# Patient Record
Sex: Female | Born: 1940 | Race: White | Hispanic: No | Marital: Married | Smoking: Never smoker
Health system: Southern US, Community
[De-identification: ages and names within clinical notes are randomized; demographics above are authoritative.]

## PROBLEM LIST (undated history)

## (undated) DIAGNOSIS — M1990 Unspecified osteoarthritis, unspecified site: Secondary | ICD-10-CM

## (undated) DIAGNOSIS — I8390 Asymptomatic varicose veins of unspecified lower extremity: Secondary | ICD-10-CM

## (undated) DIAGNOSIS — Z9049 Acquired absence of other specified parts of digestive tract: Secondary | ICD-10-CM

## (undated) DIAGNOSIS — I10 Essential (primary) hypertension: Secondary | ICD-10-CM

## (undated) DIAGNOSIS — L03116 Cellulitis of left lower limb: Secondary | ICD-10-CM

## (undated) DIAGNOSIS — K436 Other and unspecified ventral hernia with obstruction, without gangrene: Secondary | ICD-10-CM

## (undated) DIAGNOSIS — M109 Gout, unspecified: Secondary | ICD-10-CM

## (undated) DIAGNOSIS — E785 Hyperlipidemia, unspecified: Secondary | ICD-10-CM

## (undated) HISTORY — PX: VAGINAL HYSTERECTOMY: SUR661

## (undated) HISTORY — DX: Hyperlipidemia, unspecified: E78.5

## (undated) HISTORY — DX: Gout, unspecified: M10.9

## (undated) HISTORY — DX: Cellulitis of left lower limb: L03.116

## (undated) HISTORY — PX: APPENDECTOMY: SHX54

## (undated) HISTORY — DX: Other and unspecified ventral hernia with obstruction, without gangrene: K43.6

## (undated) HISTORY — DX: Asymptomatic varicose veins of unspecified lower extremity: I83.90

## (undated) HISTORY — PX: COLONOSCOPY: SHX174

## (undated) HISTORY — PX: ECTOPIC PREGNANCY SURGERY: SHX613

## (undated) HISTORY — DX: Essential (primary) hypertension: I10

## (undated) HISTORY — PX: TONSILLECTOMY AND ADENOIDECTOMY: SHX28

---

## 2000-05-24 ENCOUNTER — Emergency Department (HOSPITAL_COMMUNITY): Admission: EM | Admit: 2000-05-24 | Discharge: 2000-05-24 | Payer: Self-pay | Admitting: Emergency Medicine

## 2001-09-29 ENCOUNTER — Encounter: Payer: Self-pay | Admitting: Endocrinology

## 2001-09-29 ENCOUNTER — Encounter: Admission: RE | Admit: 2001-09-29 | Discharge: 2001-09-29 | Payer: Self-pay | Admitting: Endocrinology

## 2001-11-09 ENCOUNTER — Encounter: Payer: Self-pay | Admitting: Endocrinology

## 2001-11-09 ENCOUNTER — Ambulatory Visit (HOSPITAL_COMMUNITY): Admission: RE | Admit: 2001-11-09 | Discharge: 2001-11-09 | Payer: Self-pay | Admitting: Endocrinology

## 2001-11-11 ENCOUNTER — Encounter: Payer: Self-pay | Admitting: Endocrinology

## 2001-11-11 ENCOUNTER — Ambulatory Visit (HOSPITAL_COMMUNITY): Admission: RE | Admit: 2001-11-11 | Discharge: 2001-11-11 | Payer: Self-pay | Admitting: Endocrinology

## 2001-12-01 ENCOUNTER — Encounter: Admission: RE | Admit: 2001-12-01 | Discharge: 2001-12-01 | Payer: Self-pay | Admitting: *Deleted

## 2001-12-01 ENCOUNTER — Encounter: Payer: Self-pay | Admitting: *Deleted

## 2001-12-07 ENCOUNTER — Ambulatory Visit (HOSPITAL_COMMUNITY): Admission: RE | Admit: 2001-12-07 | Discharge: 2001-12-07 | Payer: Self-pay | Admitting: *Deleted

## 2001-12-07 ENCOUNTER — Encounter (INDEPENDENT_AMBULATORY_CARE_PROVIDER_SITE_OTHER): Payer: Self-pay | Admitting: *Deleted

## 2002-01-05 ENCOUNTER — Inpatient Hospital Stay (HOSPITAL_COMMUNITY): Admission: EM | Admit: 2002-01-05 | Discharge: 2002-01-12 | Payer: Self-pay | Admitting: Endocrinology

## 2002-12-12 ENCOUNTER — Encounter: Admission: RE | Admit: 2002-12-12 | Discharge: 2002-12-12 | Payer: Self-pay | Admitting: Endocrinology

## 2002-12-12 ENCOUNTER — Encounter: Payer: Self-pay | Admitting: Endocrinology

## 2004-09-27 ENCOUNTER — Encounter: Admission: RE | Admit: 2004-09-27 | Discharge: 2004-09-27 | Payer: Self-pay | Admitting: Endocrinology

## 2007-06-23 ENCOUNTER — Encounter: Admission: RE | Admit: 2007-06-23 | Discharge: 2007-06-23 | Payer: Self-pay | Admitting: General Surgery

## 2007-06-23 ENCOUNTER — Encounter: Payer: Self-pay | Admitting: Internal Medicine

## 2007-09-08 ENCOUNTER — Emergency Department (HOSPITAL_COMMUNITY): Admission: EM | Admit: 2007-09-08 | Discharge: 2007-09-08 | Payer: Self-pay | Admitting: Emergency Medicine

## 2008-07-11 ENCOUNTER — Encounter: Admission: RE | Admit: 2008-07-11 | Discharge: 2008-07-11 | Payer: Self-pay | Admitting: Family Medicine

## 2009-05-11 DIAGNOSIS — E669 Obesity, unspecified: Secondary | ICD-10-CM | POA: Insufficient documentation

## 2009-12-25 ENCOUNTER — Encounter: Admission: RE | Admit: 2009-12-25 | Discharge: 2009-12-25 | Payer: Self-pay | Admitting: Family Medicine

## 2010-03-11 ENCOUNTER — Encounter: Payer: Self-pay | Admitting: Internal Medicine

## 2010-03-12 ENCOUNTER — Encounter: Payer: Self-pay | Admitting: Internal Medicine

## 2010-03-22 ENCOUNTER — Other Ambulatory Visit: Payer: Self-pay | Admitting: Internal Medicine

## 2010-03-22 ENCOUNTER — Encounter (INDEPENDENT_AMBULATORY_CARE_PROVIDER_SITE_OTHER): Payer: Self-pay | Admitting: *Deleted

## 2010-03-22 ENCOUNTER — Ambulatory Visit
Admission: RE | Admit: 2010-03-22 | Discharge: 2010-03-22 | Payer: Self-pay | Source: Home / Self Care | Attending: Internal Medicine | Admitting: Internal Medicine

## 2010-03-22 DIAGNOSIS — R109 Unspecified abdominal pain: Secondary | ICD-10-CM | POA: Insufficient documentation

## 2010-03-22 DIAGNOSIS — R112 Nausea with vomiting, unspecified: Secondary | ICD-10-CM | POA: Insufficient documentation

## 2010-03-22 LAB — HEPATIC FUNCTION PANEL
Alkaline Phosphatase: 82 U/L (ref 39–117)
Bilirubin, Direct: 0.2 mg/dL (ref 0.0–0.3)
Total Protein: 6.6 g/dL (ref 6.0–8.3)

## 2010-03-28 NOTE — Letter (Signed)
Summary: Diabetic Instructions  [facility]  [address] [place], [place] [address]   Phone: [phone]  Fax: [phone]    [name] 1940-06-25 MRN: [id number]   X   ORAL DIABETIC MEDICATION INSTRUCTIONS  The day before your procedure:   Take your diabetic pill as you do normally  The day of your procedure:   Do not take your diabetic pill    We will check your blood sugar levels during the admission process and again in Recovery before discharging you home  ________________________________________________________________________  X   INSULIN (LONG ACTING) MEDICATION INSTRUCTIONS (Lantus, NPH, 70/30, Humulin, Novolin-N)   The day before your procedure:   Take  your regular evening dose    The day of your procedure:   Do not take your morning dose

## 2010-03-28 NOTE — Assessment & Plan Note (Addendum)
Summary: EPIGASTRIC PAIN/YF   History of Present Illness Visit Type: Initial Consult Primary GI MD: Stan Head MD [facility] Primary Provider: Valli Glance Requesting Provider: Valli Glance Chief Complaint: Epigastric Pain after eating meals History of Present Illness:   70 yo ww c/o terrible epigastric pain after eating and someties is periumbilical. Cannot identify specific food triggers. No new medications and no sick contacts. she is nauseated throught the dat regardless of what she eats. Raw vegetables or fruit are vomited. Milk products lead to nausea and vomiting. She sometmes vomits crackers.  Problems have been present for about 1 month. at that time she had 2 days or so of nausea and vomiting without other sxs. she thought that she had a viral syndrome, did not have fevers or other sxs. Similar problems in 2003 when had H. pylori (treated) and also had a negative gallbladder evaluation.  She had resolution for about a week and then the symptoms as above began. some loose stools when she has periumbilical pain. She has restricted by mouth intake, lost weight, and blood sugars have been lower than usual (69-70's).     GI Review of Systems    Reports abdominal pain, belching, nausea, and  vomiting.     Location of  Abdominal pain: epigastric area.    Denies acid reflux, bloating, chest pain, dysphagia with liquids, dysphagia with solids, heartburn, loss of appetite, vomiting blood, weight loss, and  weight gain.        Denies anal fissure, black tarry stools, change in bowel habit, constipation, diarrhea, diverticulosis, fecal incontinence, heme positive stool, hemorrhoids, irritable bowel syndrome, jaundice, light color stool, liver problems, rectal bleeding, and  rectal pain. Preventive Screening-Counseling & Management  Alcohol-Tobacco     Smoking Status: never      Drug Use:  no.      Current Medications (verified): 1)  Aspir-Low 81 Mg Tbec (Aspirin) .Marland Kitchen.. 1 By Mouth Once  Daily 2)  Diovan 160 Mg Tabs (Valsartan) .Marland Kitchen.. 1 By Mouth Once Daily 3)  Januvia 100 Mg Tabs (Sitagliptin Phosphate) .Marland Kitchen.. 1 By Mouth Once Daily 4)  Lantus 100 Unit/ml Soln (Insulin Glargine) .... 20u in The Am and 40u Q Pm 5)  Novolog 100 Unit/ml Soln (Insulin Aspart) .... Use As Directed 6)  Nystatin-Triamcinolone 100000-0.1 Unit/gm-% Crea (Nystatin-Triamcinolone) .... Apply Two Times A Day 7)  Simvastatin 10 Mg Tabs (Simvastatin) .Marland Kitchen.. 1 By Mouth Once Daily 8)  Torsemide 20 Mg Tabs (Torsemide) .... 2 By Mouth Once Daily  Allergies (verified): 1)  ! Codeine 2)  ! 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  Alcohol Use - no Illicit Drug Use - no Smoking Status:  never Drug Use:  no  Review of Systems       All other ROS negative except as per HPI.   Vital Signs:  Patient profile:   70 year old female Height:      63 inches Weight:      319 pounds BMI:     56.71 BSA:     2.36 Pulse rate:   80 / minute Pulse rhythm:   regular BP sitting:   118 / 76  (left arm)  Vitals Entered By: Merri Ray CMA Duncan Dull) (March 22, 2010 2:46 PM)  Physical Exam  General:  obese.  NAD Eyes:  PERRLA, no icterus. Mouth:  No deformity or lesions,  Neck:  Supple; no masses or thyromegaly. Lungs:  Clear throughout to auscultation. Heart:  Regular rate and rhythm; no murmurs, rubs,  or bruits. Abdomen:  obese, soft and nontender without splash reducible umbilical hernia BS+ Extremities:  1 + edema bilateral with hyperpigmentation and erythema (slight) but no warmth Neurologic:  Alert and  oriented  x3 Skin:  hyperpigmented intertriginous  Cervical Nodes:  No significant cervical or supraclavicular adenopathy.    Psych:  Alert and cooperative. Normal mood and affect.  Dr. Lenice Llamas 1/16 note and labs (CBC, CMET, Lipase, GH. pylori antibody all ok except BUN 26)   Impression & Recommendations:  Problem # 1:  ABDOMINAL PAIN-MULTIPLE SITES (ICD-789.09) Epigastric and periumbilical pain, post-prandial. It began after a 48-72 hour vomiting illness.  ? pancreatitis (lipase was normal, though) ? PUD, gastritis ? gastroparesis ? Januvia - will hold (she has been on x 3 years without problems so unlikely but worth a try) ? gall bladder ? occult obstructive process, intermittent Nexium samples 40 mg daily x 15 until endoscopy   Orders: EGD (EGD) TLB-Hepatic/Liver Function Pnl (80076-HEPATIC) TLB-Amylase (82150-AMYL) TLB-Lipase (83690-LIPASE)  Problem # 2:  NAUSEA AND VOMITING (ICD-787.01) ? post infectious gastroparesis issue, ? PUD, gastritis, pancreatitis, obstruction Nexium samples for now BRAT diet   Orders: EGD (EGD)  Patient Instructions: 1)  Copy sent to : Dr Collins [place] 2)  Upper Endoscopy brochure given.  3)  You have been given instructions with the date and time of your procedure. 4)  Please follow the diabetic instructions given. 5)  You will hold Januvia for now. 6)  You have been given samples of nexiuim please take 1 by mouth daily. 7)  Please follow the BRAT diet given. 8)  Please go to the basement lab today. 9)  The medication list was reviewed and reconciled.  All changed / newly prescribed medications were explained.  A complete medication list was provided to the patient / caregiver.

## 2010-03-28 NOTE — Procedures (Signed)
Summary: EGD  NAME:  [name] Bridges, [name] Bridges                        ACCOUNT NO.:  [id number]   MEDICAL RECORD NO.:  [id number]                   PATIENT TYPE:  AMB   LOCATION:  ENDO                                 FACILITY:  [facility]   PHYSICIAN:  Georgiana Spinner, M.D.                 DATE OF BIRTH:  [birth year]   DATE OF PROCEDURE:  DATE OF DISCHARGE:                                 OPERATIVE REPORT   PROCEDURE:  Upper endoscopy.   INDICATIONS FOR PROCEDURE:  Abdominal pain.   ANESTHESIA:  Demerol 75, Versed 7.5 mg.   PROCEDURE:  With the patient mildly sedated in the left lateral decubitus  position, the Olympus videoscopic endoscope was inserted and passed under  direct vision through the esophagus, which appeared normal except for distal  esophagus, and there was a question of some mild irregularity of the mucosa  right at the GE junction, and this was photographed subsequently and then  biopsied.  I then entered the stomach.  The fundus and body appeared to have  snake skinning and erythema consistent with fundic and body gastritis.  The  antrum appeared relatively normal and spared.  The duodenal bulb and second  portion of the duodenum appeared normal.  From this point, the endoscope was  slowly withdrawn, taking circumferential views in the entire duodenal  mucosa.  The endoscope was then pulled back into the stomach and placed in  retroflexion view of the stomach from below.  The endoscope was then  straightened and withdrawn, taking circumferential views in the remaining  gastric and esophageal mucosa. The patient's vital signs and pulse oximeter  remained stable.  The patient tolerated the procedure well without apparent  complications.   FINDINGS:  Changes of gastritis in body and fundus of stomach.  Await biopsy  report.  Changes of distal esophagus, biopsied.  Await biopsy report.   RECOMMENDATIONS:  The patient will call be for results and follow up with me  as an outpatient.   Proceed to colonoscopy as planned.                                               Georgiana Spinner, M.D.    GMO/MEDQ  D:  12/07/2001  T:  12/07/2001  Job:  [id number]

## 2010-03-28 NOTE — Procedures (Signed)
Summary: Colon  NAME:  [name] Bridges, PRIBBLE                        ACCOUNT NO.:  [id number]   MEDICAL RECORD NO.:  [id number]                   PATIENT TYPE:  AMB   LOCATION:  ENDO                                 FACILITY:  [facility]   PHYSICIAN:  Georgiana Spinner, M.D.                 DATE OF BIRTH:  [birth year]   DATE OF PROCEDURE:  DATE OF DISCHARGE:                                 OPERATIVE REPORT   PROCEDURE:  Colonoscopy.   INDICATIONS FOR PROCEDURE:  Colon polyp.   ANESTHESIA:  Demerol 25, Versed 2.5 mg.   PROCEDURE:  With the patient mildly sedated in the left lateral decubitus  position, the Olympus videoscopic colonoscope was inserted into the rectum  and then passed under direct vision to the cecum, identified by the  ileocecal valve and appendiceal orifice.  There was a pill seen near the  cecum.  From this point, the colonoscope was slowly withdrawn, taking  circumferential views in the entire colonic mucosa, withdrawing all the way  to the rectum and stopping only in the right colon where a small polyp was  seen, photographed, and removed using hot biopsy forceps.   TECHNIQUE:  The rectum appeared normal.  On direct, it showed hemorrhoids.  On retroflexed view, the endoscope was straightened and withdrawn.  The  patient's vital signs and pulse oximeter remained stable.  The patient  tolerated the procedure well without apparent complication.   FINDINGS:  Internal hemorrhoids and right-sided colonic polyp.  Await biopsy  report.  The patient will call me for results and follow up with me as an  outpatient.                                               Georgiana Spinner, M.D.    GMO/MEDQ  D:  12/07/2001  T:  12/07/2001  Job:  [id number]

## 2010-03-28 NOTE — Letter (Signed)
Summary: EGD Instructions  [facility]  [address] [place], [place] [address]   Phone: [phone]  Fax: [phone]       [name] Bridges    April 30, 1940    MRN: [id number]       Procedure Day /Date:04/02/10 TUE     Arrival Time: 1 pm     Procedure Time: 2 pm    Location of Procedure:                    [facility] (4th [facility])   PREPARATION FOR ENDOSCOPY   On 04/02/10  THE DAY OF THE PROCEDURE:  1.   No solid foods, milk or milk products are allowed after midnight the night before your procedure.  2.   Do not drink anything colored red or purple.  Avoid juices with pulp.  No orange juice.  3.  You may drink clear liquids until 12 noon , which is 2 hours before your procedure.                                                                                                CLEAR LIQUIDS INCLUDE: Water Jello Ice Popsicles Tea (sugar ok, no milk/cream) Powdered fruit flavored drinks Coffee (sugar ok, no milk/cream) Gatorade Juice: apple, white grape, white cranberry  Lemonade Clear bullion, consomm, broth Carbonated beverages (any kind) Strained chicken noodle soup Hard Candy   MEDICATION INSTRUCTIONS  Unless otherwise instructed, you should take regular prescription medications with a small sip of water as early as possible the morning of your procedure.  Diabetic patients - see separate instructions.               OTHER INSTRUCTIONS  You will need a responsible adult at least 69 years of age to accompany you and drive you home.   This person must remain in the waiting room during your procedure.  Wear loose fitting clothing that is easily removed.  Leave jewelry and other valuables at home.  However, you may wish to bring a book to read or an iPod/MP3 player to listen to music as you wait for your procedure to start.  Remove all body piercing jewelry and leave at home.  Total time from sign-in until discharge is approximately  2-3 hours.  You should go home directly after your procedure and rest.  You can resume normal activities the day after your procedure.  The day of your procedure you should not:   Drive   Make legal decisions   Operate machinery   Drink alcohol   Return to work  You will receive specific instructions about eating, activities and medications before you leave.    The above instructions have been reviewed and explained to me by   _______________________    I fully understand and can verbalize these instructions _____________________________ Date _________

## 2010-04-02 ENCOUNTER — Encounter: Payer: Self-pay | Admitting: Internal Medicine

## 2010-04-02 ENCOUNTER — Other Ambulatory Visit (AMBULATORY_SURGERY_CENTER): Payer: BC Managed Care – PPO | Admitting: Internal Medicine

## 2010-04-02 ENCOUNTER — Other Ambulatory Visit: Payer: Self-pay | Admitting: Internal Medicine

## 2010-04-02 DIAGNOSIS — R933 Abnormal findings on diagnostic imaging of other parts of digestive tract: Secondary | ICD-10-CM

## 2010-04-02 DIAGNOSIS — K2970 Gastritis, unspecified, without bleeding: Secondary | ICD-10-CM

## 2010-04-02 DIAGNOSIS — K2990 Gastroduodenitis, unspecified, without bleeding: Secondary | ICD-10-CM

## 2010-04-02 DIAGNOSIS — R109 Unspecified abdominal pain: Secondary | ICD-10-CM

## 2010-04-03 NOTE — Letter (Signed)
Summary: Herb Grays MD  Herb Grays MD   Imported By: Lester Hope 03/28/2010 11:18:50  _____________________________________________________________________  External Attachment:    Type:   Image     Comment:   External Document

## 2010-04-08 ENCOUNTER — Telehealth: Payer: Self-pay | Admitting: Internal Medicine

## 2010-04-11 NOTE — Miscellaneous (Signed)
Summary: hyoscyamine rx  Clinical Lists Changes  Medications: Added new medication of HYOSCYAMINE SULFATE 0.125 MG  SUBL (HYOSCYAMINE SULFATE) 1-2 sublingual and dissolved every 4 hours as needed for abdominal pain - Signed Rx of HYOSCYAMINE SULFATE 0.125 MG  SUBL (HYOSCYAMINE SULFATE) 1-2 sublingual and dissolved every 4 hours as needed for abdominal pain;  #60 x 0;  Signed;  Entered by: Iva Boop MD, Clementeen Graham;  Authorized by: Iva Boop MD, [facility];  Method used: Electronically to [facility]  [place] [address] [place], [place], [place]  [address], Ph: [phone] or [phone], Fax: [phone]    Prescriptions: HYOSCYAMINE SULFATE 0.125 MG  SUBL (HYOSCYAMINE SULFATE) 1-2 sublingual and dissolved every 4 hours as needed for abdominal pain  #60 x 0   Entered and Authorized by:   Iva Boop MD, [facility]   Signed by:   Iva Boop MD, [facility] on 04/02/2010   Method used:   [facility] to        [facility]  [place] [address] (retail)       [address]       [place], [place]  [address]       Ph: [phone] or [phone]       Fax: [phone]   RxID:   [id number]

## 2010-04-17 NOTE — Progress Notes (Addendum)
Summary: gastric bx results (ok)  Phone Note Outgoing Call   Summary of Call: biopsies of stomach are ok, only minimal gastritis is hyoscyamine helping? Iva Boop MD, [facility]  April 08, 2010 6:02 PM   Follow-up for Phone Call        Patient's husband states that patient is still no improvement on the hyoscymaine.  Still only able to eat liquids unable to tolerate solid foods.   Follow-up by: Darcey Nora RN, CGRN,  April 09, 2010 1:37 PM  Additional Follow-up for Phone Call Additional follow up Details #1::        ok Please schedule CT abd/pelvis with contrast re: epigastric and periumbilical pain, nausea and vomiting Iva Boop MD, [facility]  April 09, 2010 4:31 PM     Additional Follow-up for Phone Call Additional follow up Details #2::    Left message for patient to call back Darcey Nora RN, [facility]  April 10, 2010 9:54 AM  patient advised of Dr Marvell Fuller recommendations.  She has declined to scehdule with me at this time.  She has requested I send all of her recent notes and studies to Herb Grays and she will discuss this further with her and if she decides to schedule she will have her set it up.  I will forward this note on along with her other records.  I have asked the patient to call back here is she changes her mind and we will set up the CT scan. Follow-up by: Darcey Nora RN, CGRN,  April 10, 2010 11:08 AM   Appended Document: gastric bx results (ok) patient called back and has decided to go through with the CT scan.  Patient is scehduled for CT scan abdomen and pelvis with contrast for 04/25/10 9:30 at [facility] office Darcey Nora RN, [facility]  April 23, 2010 10:20 AM     Clinical Lists Changes  Orders: Added new Referral order of CT Abdomen/Pelvis with Contrast (CT Abd/Pelvis w/con) - Signed

## 2010-04-17 NOTE — Procedures (Signed)
Summary: Endoscopy   EGD  Procedure date:  04/02/2010  Findings:      Location: [facility]   ENDOSCOPY PROCEDURE REPORT  PATIENT:  [name], Bridges  MR#:  [id number] BIRTHDATE:   05-03-1940, 69 yrs. old   GENDER:   female  ENDOSCOPIST:   Iva Boop, MD, [facility]    PROCEDURE DATE:  04/02/2010 PROCEDURE:  EGD with biopsy, 25956 ASA CLASS:   Class II INDICATIONS: abdominal pain epigastric and periumbilical  MEDICATIONS:    Fentanyl 50 mcg IV, Versed 5 mg TOPICAL ANESTHETIC:   Exactacain Spray  DESCRIPTION OF PROCEDURE:   After the risks benefits and alternatives of the procedure were thoroughly explained, informed consent was obtained.  The LB [id number] endoscope was introduced through the mouth and advanced to the second portion of the duodenum, without limitations.  The instrument was slowly withdrawn as the mucosa was fully examined. <<PROCEDUREIMAGES>>      <<OLD IMAGES>>  Abnormal appearing mucosa in the antrum. Mottled and erythematous. ? gastritis. Multiple biopsies were obtained and sent to pathology.  Otherwise the examination was normal.    Retroflexed views revealed no abnormalities.    The scope was then withdrawn from the patient and the procedure completed.  COMPLICATIONS:   None ENDOSCOPIC IMPRESSION:  1) Abnormal mucosa in the antrum, ? gastritis - biopsied  2) Otherwise normal examination RECOMMENDATIONS:  1) Await biopsy results  2) Trial off Januvia if not already done  3) Hyoscyamine 0.125 milligrams sublingual, 1-2 every 4 hours as needed for abdominal pain     Iva Boop, MD, [facility]    CC: Herb Grays, MD The Patient

## 2010-04-23 ENCOUNTER — Other Ambulatory Visit: Payer: Self-pay | Admitting: Internal Medicine

## 2010-04-23 ENCOUNTER — Encounter (INDEPENDENT_AMBULATORY_CARE_PROVIDER_SITE_OTHER): Payer: Self-pay | Admitting: *Deleted

## 2010-04-23 ENCOUNTER — Other Ambulatory Visit: Payer: BC Managed Care – PPO

## 2010-04-23 DIAGNOSIS — R109 Unspecified abdominal pain: Secondary | ICD-10-CM

## 2010-04-23 DIAGNOSIS — R1013 Epigastric pain: Secondary | ICD-10-CM

## 2010-04-23 DIAGNOSIS — R1033 Periumbilical pain: Secondary | ICD-10-CM

## 2010-04-23 LAB — BUN: BUN: 20 mg/dL (ref 6–23)

## 2010-04-23 LAB — CREATININE, SERUM: Creatinine, Ser: 0.7 mg/dL (ref 0.4–1.2)

## 2010-04-24 ENCOUNTER — Telehealth: Payer: Self-pay | Admitting: Internal Medicine

## 2010-04-25 ENCOUNTER — Ambulatory Visit (INDEPENDENT_AMBULATORY_CARE_PROVIDER_SITE_OTHER)
Admission: RE | Admit: 2010-04-25 | Discharge: 2010-04-25 | Disposition: A | Discharge status: Home / Self Care | Payer: BC Managed Care – PPO | Source: Ambulatory Visit | Attending: Internal Medicine | Admitting: Internal Medicine

## 2010-04-25 DIAGNOSIS — R1013 Epigastric pain: Secondary | ICD-10-CM

## 2010-04-25 DIAGNOSIS — R1033 Periumbilical pain: Secondary | ICD-10-CM

## 2010-04-25 HISTORY — DX: Acquired absence of other specified parts of digestive tract: Z90.49

## 2010-04-25 MED ORDER — IOHEXOL 300 MG/ML  SOLN
80.0000 mL | Freq: Once | INTRAMUSCULAR | Status: AC | PRN
  Administered 2010-04-25: 80 mL via INTRAVENOUS

## 2010-05-02 NOTE — Progress Notes (Signed)
Summary: Medication ?s  Phone Note Call from Patient Call back at Work Phone [phone] Call back at ext [phone]   Caller: Patient Call For: Dr. Leone Payor Reason for Call: Talk to Nurse Summary of Call: Patient needs to talk to sheri about her nexium samples, she is almost out and needs RX if he wants her to continue with Nexium but does not have pharmacy so she would need to pick up written RX Initial call taken by: [place] Johnson,  April 24, 2010 10:30 AM  Follow-up for Phone Call        Called patient back and patient states that the Nexium samples are working and she would like a written RX because she is not sure which pharmacy she would like to use. I told patient that I will print out RX and leave up front for patient to pick up. Follow-up by: Ok Anis CMA,  April 24, 2010 11:36 AM    New/Updated Medications: NEXIUM 40 MG CPDR (ESOMEPRAZOLE MAGNESIUM) one tablet by mouth once daily Prescriptions: NEXIUM 40 MG CPDR (ESOMEPRAZOLE MAGNESIUM) one tablet by mouth once daily  #30 x 4   Entered by:   Ok Anis CMA   Authorized by:   Iva Boop MD, [facility]   Signed by:   Ok Anis CMA on 04/24/2010   Method used:   Print then Give to Patient   RxID:   [id number]

## 2010-07-12 NOTE — Discharge Summary (Signed)
   NAME:  [name] Bridges, [name] Bridges                        ACCOUNT NO.:  [id number]   MEDICAL RECORD NO.:  [id number]                   PATIENT TYPE:  INP   LOCATION:  0443                                 FACILITY[facility]  [facility]   PHYSICIAN:  Brooke Bonito, M.D.                   DATE OF BIRTH:  February 21, 1941   DATE OF ADMISSION:  01/05/2002  DATE OF DISCHARGE:  01/12/2002                                 DISCHARGE SUMMARY   DISCHARGE DIAGNOSES:  1. Cellulitis of the legs.  2. Ulcer on the calf.  3. Diabetes mellitus.  4. Hypothyroidism.  5. Edema.  6. Morbid obesity.   HOSPITAL COURSE:  The patient is a 70 year old female patient who failed  outpatient therapy for cellulitis of the calf and it is felt that she needed  to be hospitalized for IV antibiotics and local wound care.   She was treated with IV Levaquin along with local wound care.  She was seen  in consultation by Dr. Jerl Santos, orthopedic surgeon, who outlined additional  wound care management.  She was treated with Lovenox for DVT prophylaxis and  is placed at bedrest with leg elevation.  The swelling went down and the  cellulitis improved slowly.  Prior to discharge she was fitted for Jobst  pressure stockings and the plan was for her to wear these as soon as the  ulcers had healed, and she is to be followed by myself - Dr. Juleen China - a week  after discharge.   CONDITION ON DISCHARGE:  Improved.   DISCHARGE MEDICATIONS:  1. Lantus insulin 36 units daily.  2. Tequin 400 mg p.o. daily x10.  3. She is to resume her previous medications of Protonix, Glucotrol,     Glucophage as prior to admission.   LABORATORY DATA:  CBC on November 12:  White count 9000, hemoglobin 12.4.  Electrolytes:  Sodium 139, potassium 4.0, glucose 148, BUN 15, creatinine  0.7.                                               Brooke Bonito, M.D.    WDK/MEDQ  D:  03/24/2002  T:  03/24/2002  Job:  [id number]

## 2010-07-12 NOTE — H&P (Signed)
NAME:  [name] Bridges, [name] Bridges                        ACCOUNT NO.:  [id number]   MEDICAL RECORD NO.:  [id number]                   PATIENT TYPE:   LOCATION:                                       FACILITY:  [facility]   PHYSICIAN:  Janae Bridgeman. Lendell Caprice, M.D.           DATE OF BIRTH:  1940-05-26   DATE OF ADMISSION:  01/05/2002  DATE OF DISCHARGE:                                HISTORY & PHYSICAL   CHIEF COMPLAINT:  Cellulitis follow-up.   HISTORY OF PRESENT ILLNESS:  This 70 year old female patient was treated in  office on Monday the 10th by Vangie Bicker, P.A. for cellulitis of the  right calf that had begun acutely that day onset around 11:30 a.m. with  chills and redness and warmth in the right calf.  She had a red, warm,  tender area in the right medial calf with some small healing draining ulcers  on the left posterior calf.  She was placed on Cipro 750 mg b.i.d. and  returns today for follow-up stating her leg is much worse.  A large blister  erupted and began to drain Monday evening and has worsened since with  increasing redness, warmth, and pain.  She is having blistering around the  site as well.  The wounds on her left calf are small and not erythematous,  but continue to drain.  She has had continued nocturnal fevers around 100  degrees and chills.  She reports no chest pain and dyspnea, hemoptysis,  nausea, vomiting, dizziness, or syncope.  She has no loss of sensation,  pallor, or numbness in the right foot.  Blood sugars running around 200-220  and have been somewhat elevated since her recent diagnosis with H. pylori  gastritis.  Her current level of edema is increased in the right leg since  developing the cellulitis.  She reports no injury to the leg.  She has had  decline in her clinical condition since starting maximum doses of oral Cipro  on Monday.   REVIEW OF SYSTEMS:  The patient reports no abdominal pain, urinary  complaints, weakness, dizziness, chest pain,  shortness of breath.  Review of  systems is otherwise negative.   SOCIAL HISTORY:  The patient is married with two children.  These are  actually her grandchildren that she is raising.  Her husband is disabled  from medical conditions.  She is the primary supporter of the household.  She is employed at [employer].  She is quite distressed because she  missed work earlier for some illness and says that she cannot miss any work  or she will be fired.  She does not smoke or consume alcohol.   FAMILY HISTORY:  Mother living with heart disease and diabetes.  Father is  living and healthy.  She has one brother who is a diabetic and one great  aunt who is a diabetic.   PAST SURGICAL HISTORY:  The patient had  a hysterectomy about 20 years ago  and had a recent endoscopy which showed gastritis that was H. pylori  positive.   PAST MEDICAL HISTORY:  1. Obesity.  2. Edema.  3. Type 2 diabetes.  4. Gastritis.   OBJECTIVE:  GENERAL:  Pleasant, morbidly obese 70 year old female alert,  oriented, and in no apparent distress with an uncovered large draining wound  on her right calf.  VITAL SIGNS:  Weight 325 pounds, blood pressure 142/82, temperature 96.7  degrees.  NECK:  Supple without lymphadenopathy or mass.  Her face is without redness,  flushing, or rash.  CARDIAC:  Regular rate and rhythm without murmur.  CHEST:  Clear.  ABDOMEN:  Quite obese.  EXTREMITIES:  She has bilateral pitting edema 2+ in the right leg and about  1+ in the left leg.  She has about three small superficial circular  ulcerations draining a clear serous fluid in the left posterior ankle.  In  the right medial calf there is an extensive deep balmy erythematous  cellulitis encompassing most of the medial and dorsal calf with a large  central bullous draining lesion draining a serous yellow fluid.  There are  multiple small vesicles forming in the area as well.  In the distal right  leg she has some coolness to  touch, but no significant pallor and her pulses  are intact.  Sensation is intact.  Wound culture is obtained from the  draining blister.  Gait is somewhat limited and she is limping in the right  leg due to her pain.  RECTAL:  Deferred.  Not pertinent to present illness.   LABORATORY WORK:  CBC drawn on Monday shows a white count of 13,000.  Differential apparently was not done.  Blood cultures are pending from  Monday and wound culture is completed today.   ASSESSMENT:  1. Progressive cellulitis with large open draining blister of the right     calf.  2. Type 2 diabetes.  3. Edema.  4. Obesity.  5. Hypothyroidism.   PLAN:  The patient will be admitted to [facility] to Dr.  ___________ for IV Levaquin, follow-up serial CBCs and BMP.  Obtain  orthopedic consult should she need any debridement or develop more extensive  soft tissue infection.  Watch metabolic studies and renal function due to  her risk of developing lactic acidosis on Glucophage if she were to become  septic.  Cleanse wounds with sterile saline and apply a nonstick dressing  and bulky gauze dressing.  May need some wet to dry soaks to help debride  and dry out the blister.  Continue all current medications, but change  Demadex to 20 mg IV daily.  Elevate legs and remain on strict bed rest.  Dr.  Nolon Nations office was notified for orthopedic consult.  She is discharged  from office and sent straight to [facility] in stable condition  after wrapping her leg in a sterile dressing.     Tarri Fuller, P.A.                     Janae Bridgeman. Lendell Caprice, M.D.    JR/MEDQ  D:  01/05/2002  T:  01/05/2002  Job:  [id number]

## 2010-07-12 NOTE — Op Note (Signed)
   NAME:  [name] Bridges, [name] Bridges                        ACCOUNT NO.:  [id number]   MEDICAL RECORD NO.:  [id number]                   PATIENT TYPE:  AMB   LOCATION:  ENDO                                 FACILITY:  [facility]   PHYSICIAN:  Georgiana Spinner, M.D.                 DATE OF BIRTH:  05/31/1940   DATE OF PROCEDURE:  DATE OF DISCHARGE:                                 OPERATIVE REPORT   PROCEDURE:  Colonoscopy.   INDICATIONS FOR PROCEDURE:  Colon polyp.   ANESTHESIA:  Demerol 25, Versed 2.5 mg.   PROCEDURE:  With the patient mildly sedated in the left lateral decubitus  position, the Olympus videoscopic colonoscope was inserted into the rectum  and then passed under direct vision to the cecum, identified by the  ileocecal valve and appendiceal orifice.  There was a pill seen near the  cecum.  From this point, the colonoscope was slowly withdrawn, taking  circumferential views in the entire colonic mucosa, withdrawing all the way  to the rectum and stopping only in the right colon where a small polyp was  seen, photographed, and removed using hot biopsy forceps.   TECHNIQUE:  The rectum appeared normal.  On direct, it showed hemorrhoids.  On retroflexed view, the endoscope was straightened and withdrawn.  The  patient's vital signs and pulse oximeter remained stable.  The patient  tolerated the procedure well without apparent complication.   FINDINGS:  Internal hemorrhoids and right-sided colonic polyp.  Await biopsy  report.  The patient will call me for results and follow up with me as an  outpatient.                                               Georgiana Spinner, M.D.    GMO/MEDQ  D:  12/07/2001  T:  12/07/2001  Job:  [id number]

## 2010-07-12 NOTE — Op Note (Signed)
   NAME:  [name] Bridges, [name] Bridges                        ACCOUNT NO.:  [id number]   MEDICAL RECORD NO.:  [id number]                   PATIENT TYPE:  AMB   LOCATION:  ENDO                                 FACILITY:  [facility]   PHYSICIAN:  Georgiana Spinner, M.D.                 DATE OF BIRTH:  Dec 28, 1940   DATE OF PROCEDURE:  DATE OF DISCHARGE:                                 OPERATIVE REPORT   PROCEDURE:  Upper endoscopy.   INDICATIONS FOR PROCEDURE:  Abdominal pain.   ANESTHESIA:  Demerol 75, Versed 7.5 mg.   PROCEDURE:  With the patient mildly sedated in the left lateral decubitus  position, the Olympus videoscopic endoscope was inserted and passed under  direct vision through the esophagus, which appeared normal except for distal  esophagus, and there was a question of some mild irregularity of the mucosa  right at the GE junction, and this was photographed subsequently and then  biopsied.  I then entered the stomach.  The fundus and body appeared to have  snake skinning and erythema consistent with fundic and body gastritis.  The  antrum appeared relatively normal and spared.  The duodenal bulb and second  portion of the duodenum appeared normal.  From this point, the endoscope was  slowly withdrawn, taking circumferential views in the entire duodenal  mucosa.  The endoscope was then pulled back into the stomach and placed in  retroflexion view of the stomach from below.  The endoscope was then  straightened and withdrawn, taking circumferential views in the remaining  gastric and esophageal mucosa. The patient's vital signs and pulse oximeter  remained stable.  The patient tolerated the procedure well without apparent  complications.   FINDINGS:  Changes of gastritis in body and fundus of stomach.  Await biopsy  report.  Changes of distal esophagus, biopsied.  Await biopsy report.   RECOMMENDATIONS:  The patient will call be for results and follow up with me  as an outpatient.  Proceed to  colonoscopy as planned.                                               Georgiana Spinner, M.D.    GMO/MEDQ  D:  12/07/2001  T:  12/07/2001  Job:  [id number]

## 2010-08-12 ENCOUNTER — Other Ambulatory Visit: Payer: Self-pay | Admitting: Internal Medicine

## 2010-08-12 MED ORDER — DEXLANSOPRAZOLE 60 MG PO CPDR: 60.0000 mg | DELAYED_RELEASE_CAPSULE | Freq: Every day | ORAL | Status: AC

## 2010-08-12 NOTE — Telephone Encounter (Signed)
Patient called requesting a Rx for Dexilant. Patient was on Nexium but stated she became allergic to the purple dye in the medication. Her PCP gave her samples of Dexilant to try but stated that Dr.Gessner would have to change her rx to this if she wanted to continue medication. Patient stated that she talked to [facility] about this and Lavonna Rua told her to try the samples and if they helped that she would see if Dr. Leone Payor would write a rx for this. Patient states she take medication bid but Lavonna Rua states patient could have medication once daily if want more than that she would need to ask Dr. Leone Payor about this. Rx printed for patient to pick up for Dexilant 60 mg 1 tablet by mouth daily #90 with 3 refills (90 day supply).

## 2010-11-04 ENCOUNTER — Encounter: Payer: Self-pay | Admitting: Internal Medicine

## 2010-11-22 LAB — POCT I-STAT, CHEM 8
BUN: 19
Calcium, Ion: 1.12
Chloride: 104
Glucose, Bld: 127 — ABNORMAL HIGH

## 2010-11-22 LAB — POCT CARDIAC MARKERS: Troponin i, poc: <0.05

## 2010-11-22 LAB — DIFFERENTIAL
Lymphocytes Relative: 20
Monocytes Absolute: 0.8
Monocytes Relative: 10
Neutro Abs: 5.7

## 2010-11-22 LAB — URINALYSIS, ROUTINE W REFLEX MICROSCOPIC
Ketones, ur: NEGATIVE
Nitrite: NEGATIVE
Specific Gravity, Urine: 1.009
pH: 7

## 2010-11-22 LAB — CBC
Hemoglobin: 13.4
RBC: 5.12 — ABNORMAL HIGH

## 2010-12-03 ENCOUNTER — Encounter: Payer: Self-pay | Admitting: Internal Medicine

## 2010-12-03 ENCOUNTER — Ambulatory Visit (AMBULATORY_SURGERY_CENTER): Payer: BC Managed Care – PPO

## 2010-12-03 VITALS — Ht 63.0 in | Wt 316.6 lb

## 2010-12-03 DIAGNOSIS — Z86010 Personal history of colonic polyps: Secondary | ICD-10-CM

## 2010-12-03 MED ORDER — PEG-KCL-NACL-NASULF-NA ASC-C 100 G PO SOLR: 1.0000 | Freq: Once | ORAL | Status: AC

## 2010-12-17 ENCOUNTER — Encounter: Payer: Self-pay | Admitting: Internal Medicine

## 2010-12-17 ENCOUNTER — Ambulatory Visit (AMBULATORY_SURGERY_CENTER): Payer: BC Managed Care – PPO | Admitting: Internal Medicine

## 2010-12-17 VITALS — BP 127/59 | HR 68 | Temp 97.7°F | Resp 24 | Ht 63.0 in | Wt 316.0 lb

## 2010-12-17 DIAGNOSIS — Z86010 Personal history of colonic polyps: Secondary | ICD-10-CM

## 2010-12-17 DIAGNOSIS — K648 Other hemorrhoids: Secondary | ICD-10-CM

## 2010-12-17 DIAGNOSIS — Z1211 Encounter for screening for malignant neoplasm of colon: Secondary | ICD-10-CM

## 2010-12-17 DIAGNOSIS — D126 Benign neoplasm of colon, unspecified: Secondary | ICD-10-CM

## 2010-12-17 DIAGNOSIS — K5730 Diverticulosis of large intestine without perforation or abscess without bleeding: Secondary | ICD-10-CM

## 2010-12-17 LAB — GLUCOSE, CAPILLARY: Glucose-Capillary: 153 mg/dL — ABNORMAL HIGH (ref 70–99)

## 2010-12-17 MED ORDER — SODIUM CHLORIDE 0.9 % IV SOLN: 500.0000 mL | INTRAVENOUS | Status: DC

## 2010-12-17 NOTE — Patient Instructions (Signed)
One polyp was removed. It looks benign. I will send you a letter about the results and tmning of next routine preventive colonoscopy after I review the pathology report. You also have diverticulosis and hemorrhoids - usually do not cause trouble. Please read the information provided. Iva Boop, MD, [facility]

## 2010-12-18 ENCOUNTER — Telehealth: Payer: Self-pay | Admitting: *Deleted

## 2010-12-18 NOTE — Telephone Encounter (Signed)
Tried home number but did not leave message because there was no identifiers on message. EWM

## 2010-12-23 ENCOUNTER — Encounter: Payer: Self-pay | Admitting: Internal Medicine

## 2010-12-23 NOTE — Progress Notes (Signed)
Quick Note:  Small adenoma repeat colonoscopy 7 years 2019 ______

## 2011-02-26 ENCOUNTER — Other Ambulatory Visit: Payer: Self-pay | Admitting: Family Medicine

## 2011-02-26 DIAGNOSIS — Z1231 Encounter for screening mammogram for malignant neoplasm of breast: Secondary | ICD-10-CM

## 2011-03-21 ENCOUNTER — Ambulatory Visit
Admission: RE | Admit: 2011-03-21 | Discharge: 2011-03-21 | Disposition: A | Discharge status: Home / Self Care | Payer: BC Managed Care – PPO | Source: Ambulatory Visit | Attending: Family Medicine | Admitting: Family Medicine

## 2011-03-21 DIAGNOSIS — Z1231 Encounter for screening mammogram for malignant neoplasm of breast: Secondary | ICD-10-CM

## 2011-07-02 ENCOUNTER — Other Ambulatory Visit: Payer: Self-pay | Admitting: Family Medicine

## 2011-07-02 DIAGNOSIS — Z780 Asymptomatic menopausal state: Secondary | ICD-10-CM

## 2011-07-07 IMAGING — CT CT ABD-PELV W/ CM
2 of 6 series · 17 of 46 positions shown, 19 images · IV contrast (Omnipaque 300)
Comparison: 06/23/2007

CLINICAL DATA: Periumbilical pain.  Known.  Umbilical hernia.
Recent endoscopy which demonstrated gastritis.

CT ABDOMEN AND PELVIS WITH CONTRAST
TECHNIQUE: Multidetector CT imaging of the abdomen and pelvis was
performed following the standard protocol during bolus
administration of intravenous contrast.
Contrast: 80 ml of Omnipaque 300% and oral contrast

[Series 2: abd/ pel 5mm · axial · 0.97mm/px · z∈[-520,-94]mm · 14 of 97 slices shown, 16 images]
[im 6/97  soft-tissue]
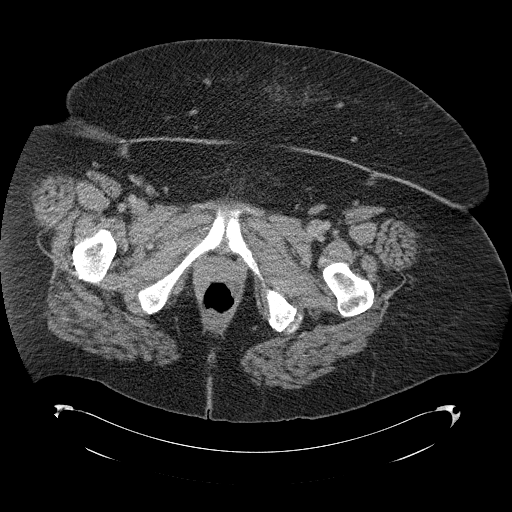
[im 6/97  bone]
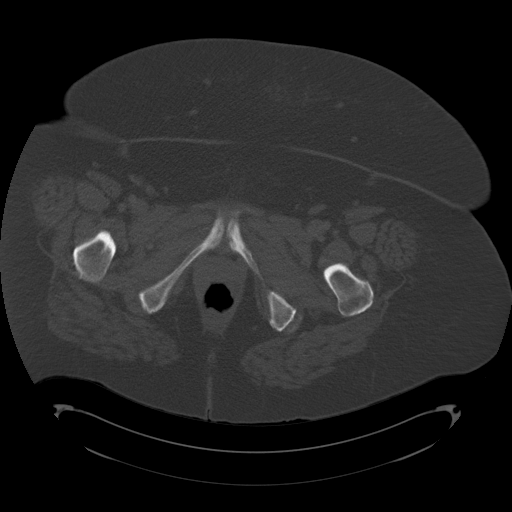
[im 12/97  soft-tissue]
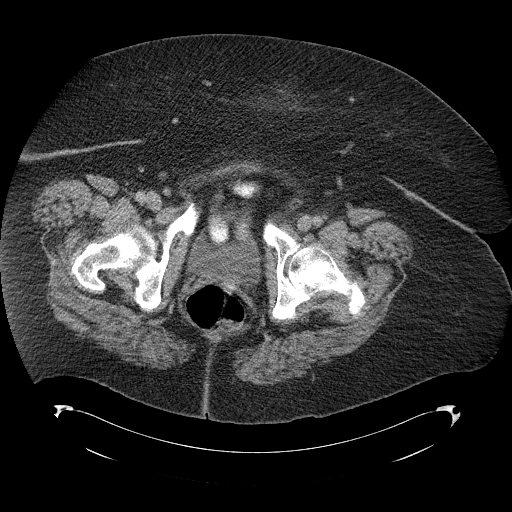
[im 17/97  soft-tissue]
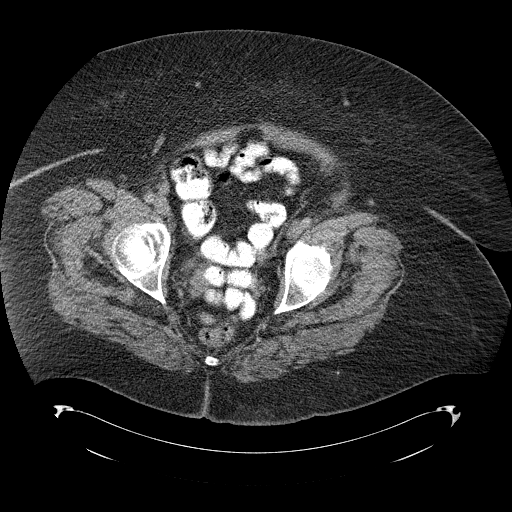
[im 29/97  soft-tissue]
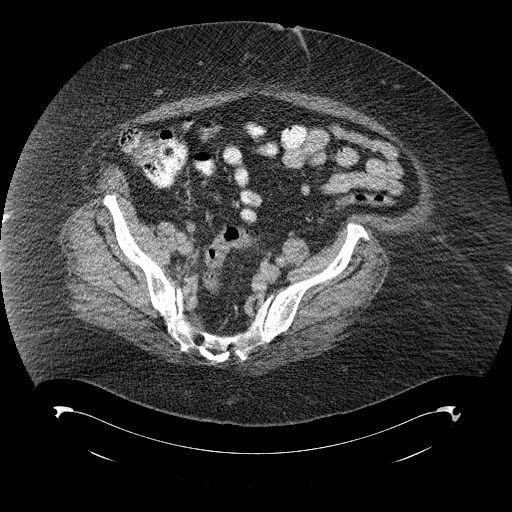
[im 34/97  soft-tissue]
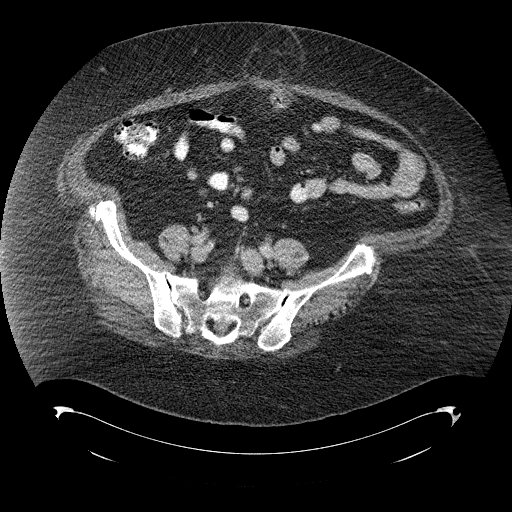
[im 40/97  soft-tissue]
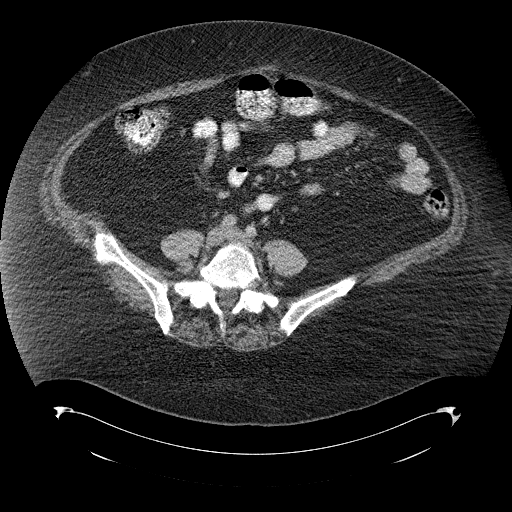
[im 46/97  soft-tissue]
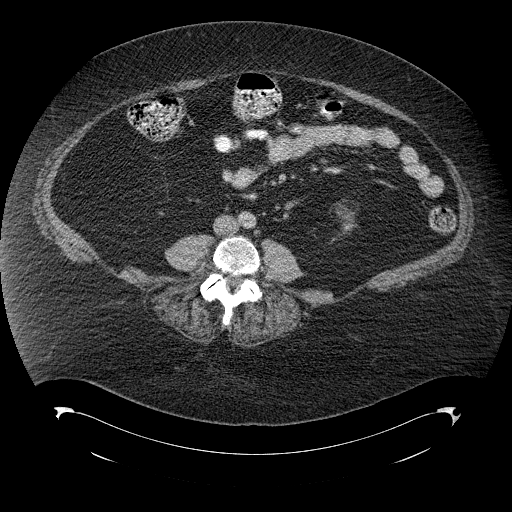
[im 51/97  soft-tissue]
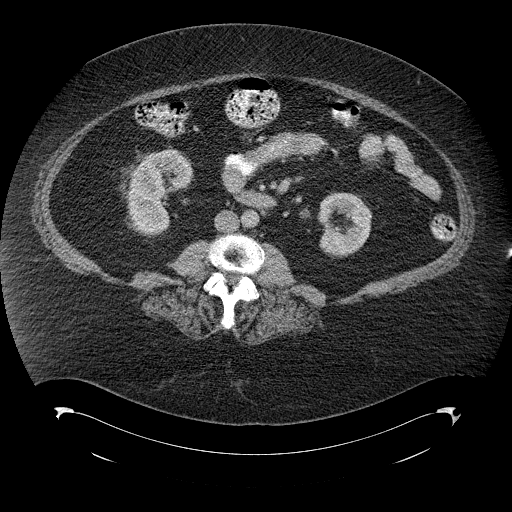
[im 57/97  soft-tissue]
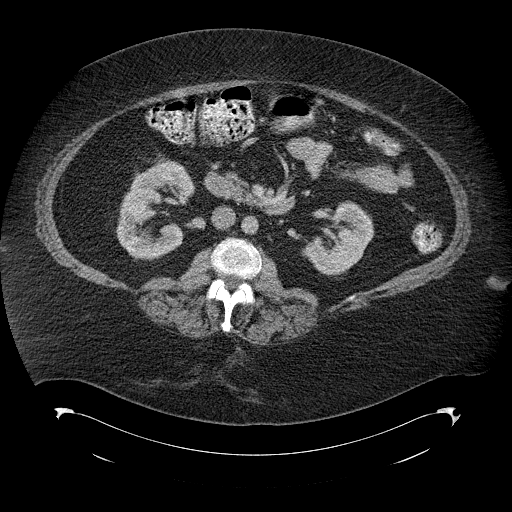
[im 57/97  bone]
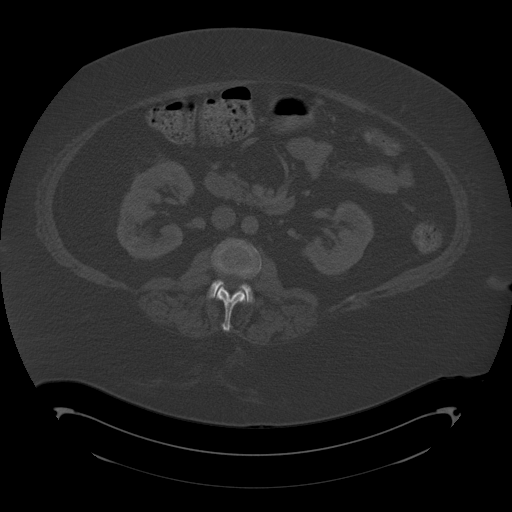
[im 63/97  soft-tissue]
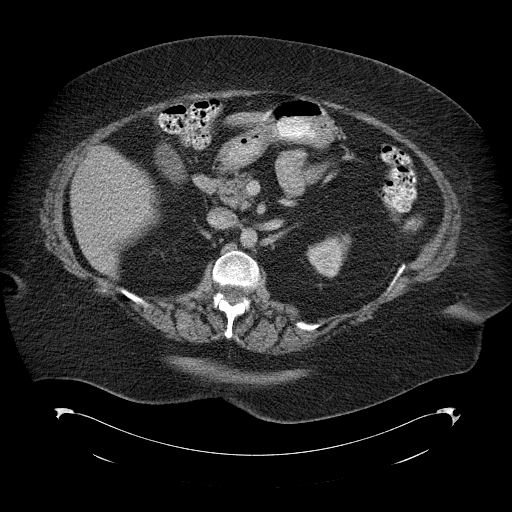
[im 74/97  soft-tissue]
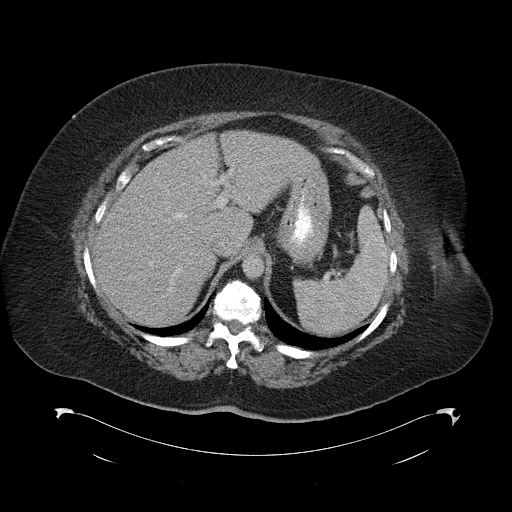
[im 80/97  soft-tissue]
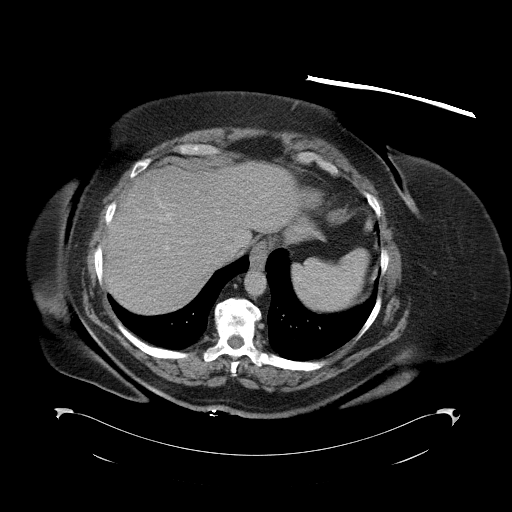
[im 85/97  soft-tissue]
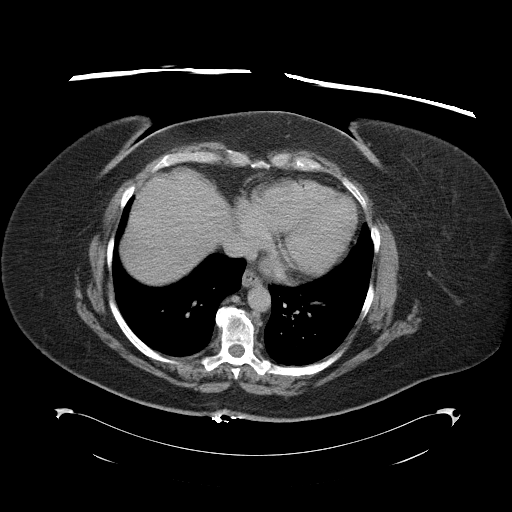
[im 91/97  soft-tissue]
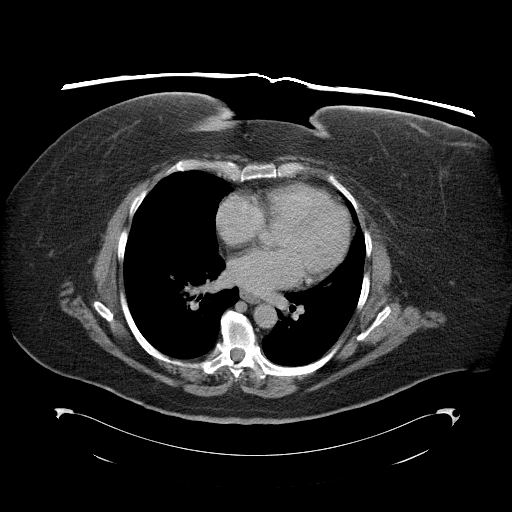

[Series 602: cor · coronal · 0.98mm/px · 3 of 151 slices shown]
[im 51/151  soft-tissue]
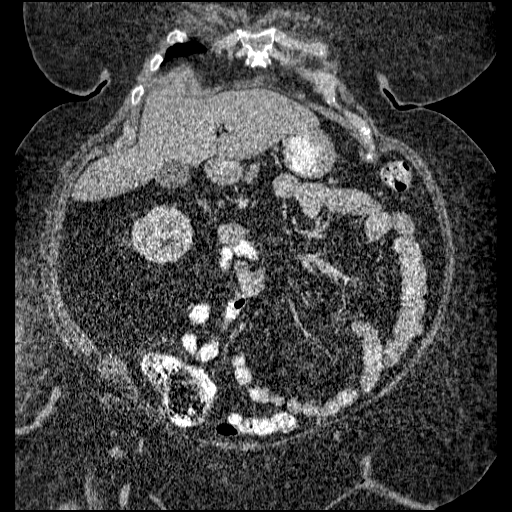
[im 67/151  soft-tissue]
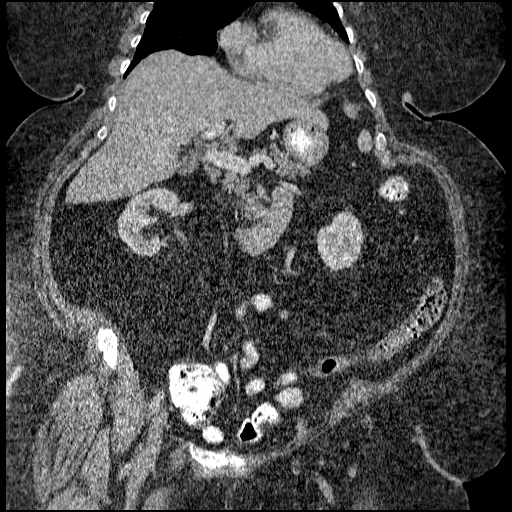
[im 84/151  soft-tissue]
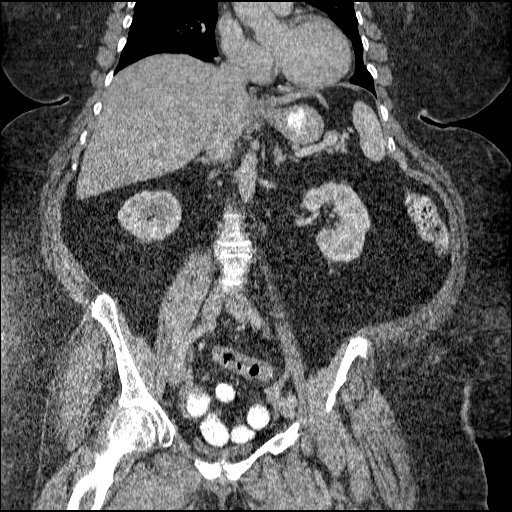

[17 of 46 positions shown; findings below may reference images not displayed]

FINDINGS: The lung bases appear clear.  Distal bronchi in the left
lower  lobe are prominent and may signify mild bronchiectasis.

The liver, spleen, gallbladder, pancreas, adrenal glands and
kidneys are normal with no worrisome findings.

Adequate bowel contrast was achieved with the exception of
incomplete gastric filling.  The ileocecal valve has a normal
appearance.  No focal bowel abnormalities are suggested.

The pelvis has an unremarkable post hysterectomy appearance.  No
intra-abdominal or pelvic fluid or inflammatory change is
identified.  No pathologic adenopathy is noted.

A fat containing umbilical hernia is identified with an opening
measuring 2.4 centimeters (axial image 62).  This measures 6.4 x
7.3 by 6.3 cm.  A loop of transverse colon lies adjacent to the
area of dysraphism. Extraabdominal soft tissue planes are otherwise
maintained.

Bone windows demonstrate significant degenerative change of the
lower thoracic spine and lower lumbar spine and milder degenerative
changes of both hips.  Minimal vascular calcification is identified
in the ascending aorta and vascular structures are otherwise
intact.
IMPRESSION: Question mild left lower lobe bronchiectatic change.

Fat containing umbilical hernia with no significant change in
appearance since the 1775.

No other focal abdominal or pelvic abnormality identified.

## 2011-07-18 ENCOUNTER — Other Ambulatory Visit: Payer: BC Managed Care – PPO

## 2011-07-24 ENCOUNTER — Ambulatory Visit
Admission: RE | Admit: 2011-07-24 | Discharge: 2011-07-24 | Disposition: A | Discharge status: Home / Self Care | Payer: BC Managed Care – PPO | Source: Ambulatory Visit | Attending: Family Medicine | Admitting: Family Medicine

## 2011-07-24 DIAGNOSIS — Z780 Asymptomatic menopausal state: Secondary | ICD-10-CM

## 2011-09-25 DIAGNOSIS — E118 Type 2 diabetes mellitus with unspecified complications: Secondary | ICD-10-CM | POA: Insufficient documentation

## 2012-02-10 DIAGNOSIS — K436 Other and unspecified ventral hernia with obstruction, without gangrene: Secondary | ICD-10-CM | POA: Insufficient documentation

## 2012-03-02 ENCOUNTER — Encounter (INDEPENDENT_AMBULATORY_CARE_PROVIDER_SITE_OTHER): Payer: Self-pay | Admitting: General Surgery

## 2012-03-16 ENCOUNTER — Encounter (INDEPENDENT_AMBULATORY_CARE_PROVIDER_SITE_OTHER): Payer: Self-pay | Admitting: General Surgery

## 2012-03-26 ENCOUNTER — Encounter (INDEPENDENT_AMBULATORY_CARE_PROVIDER_SITE_OTHER): Payer: Self-pay | Admitting: General Surgery

## 2012-03-29 ENCOUNTER — Telehealth (INDEPENDENT_AMBULATORY_CARE_PROVIDER_SITE_OTHER): Payer: Self-pay | Admitting: General Surgery

## 2012-04-12 ENCOUNTER — Ambulatory Visit (INDEPENDENT_AMBULATORY_CARE_PROVIDER_SITE_OTHER): Payer: BC Managed Care – PPO | Admitting: General Surgery

## 2012-04-12 ENCOUNTER — Other Ambulatory Visit (INDEPENDENT_AMBULATORY_CARE_PROVIDER_SITE_OTHER): Payer: Self-pay | Admitting: General Surgery

## 2012-04-12 ENCOUNTER — Encounter (INDEPENDENT_AMBULATORY_CARE_PROVIDER_SITE_OTHER): Payer: Self-pay | Admitting: General Surgery

## 2012-04-12 VITALS — BP 148/58 | HR 82 | Temp 96.9°F | Resp 18 | Ht 63.0 in | Wt 329.2 lb

## 2012-04-12 DIAGNOSIS — K420 Umbilical hernia with obstruction, without gangrene: Secondary | ICD-10-CM

## 2012-04-12 DIAGNOSIS — L0390 Cellulitis, unspecified: Secondary | ICD-10-CM

## 2012-04-12 DIAGNOSIS — E119 Type 2 diabetes mellitus without complications: Secondary | ICD-10-CM

## 2012-04-12 NOTE — Progress Notes (Signed)
Patient ID: [name] Bridges, female   DOB: March 13, 1940, 72 y.o.   MRN: [id number]  No chief complaint on file.   HPI [name] Bridges is a 72 y.o. female.  She is referred once again by Dr. Collins Scotland for evaluation of her umbilical hernia.  This patient has significant comorbidities with superobesity BMI 58, insulin-dependent diabetes, recurrent cellulitis of the lower extremities,GERD, and diverticulosis.  She's had an umbilical hernia for 5 years or more. It doesn't really bother her that much. She has missed 3 appointments with me for this over the past cervical months. She was recently evaluated for salines of her left leg which is recurrent and has been started on a two-week course of doxycycline. The umbilicus was noted to be a little bit irritated and bleeding a little bit. She was worked in to the office today  The patient is eating well. Has no GI symptoms. Normal bowel function. No fever or chills. Her mobility is affected by  joint problems. She ambulates with a cane.  She is in no distress today and pleasant.  HPI  Past Medical History  Diagnosis Date  . S/P appy   . Diabetes mellitus   . Hypertension   . Hyperlipidemia   . Cellulitis of left lower extremity     Begin antibiotic 04/12/12  . Gout     Past Surgical History  Procedure Laterality Date  . Vaginal hysterectomy    . Ectopic pregnancy surgery    . Colonoscopy  2003, 12/17/10    2003: small adenoma 2012: small adenoma (6-18mm) and diverticulosis  . Appendectomy    . Tonsillectomy and adenoidectomy      History reviewed. No pertinent family history.  Social History History  Substance Use Topics  . Smoking status: Never Smoker   . Smokeless tobacco: Never Used  . Alcohol Use: No    Allergies  Allergen Reactions  . Codeine Nausea And Vomiting  . Sulfonamide Derivatives Nausea And Vomiting    Current Outpatient Prescriptions  Medication Sig Dispense Refill  . HYDROcodone-acetaminophen (NORCO/VICODIN)  5-325 MG per tablet Take 1 tablet by mouth. Takes one daily.      . meloxicam (MOBIC) 15 MG tablet Take 15 mg by mouth daily.      . insulin aspart (NOVOLOG) 100 UNIT/ML injection Inject into the skin 3 (three) times daily before meals. Sliding scale       . insulin detemir (LEVEMIR) 100 UNIT/ML injection Inject into the skin 2 (two) times daily. Inject 20 units in am / 40 units evening       . potassium gluconate 595 MG TABS Take 595 mg by mouth daily.        . simvastatin (ZOCOR) 10 MG tablet Take 10 mg by mouth at bedtime.        . torsemide (DEMADEX) 20 MG tablet Take 20 mg by mouth daily.        . valsartan (DIOVAN) 160 MG tablet Take 160 mg by mouth daily.         No current facility-administered medications for this visit.    Review of Systems Review of Systems  Constitutional: Negative for fever, chills and unexpected weight change.  HENT: Negative for hearing loss, congestion, sore throat, trouble swallowing and voice change.   Eyes: Negative for visual disturbance.  Respiratory: Negative for cough and wheezing.   Cardiovascular: Positive for leg swelling. Negative for chest pain and palpitations.  Gastrointestinal: Positive for abdominal pain. Negative for nausea, vomiting, diarrhea, constipation,  blood in stool, abdominal distention and anal bleeding.       EGD 2/7/ 2012 showed gastritis. Colonoscopy 12/17/2010 shows moderately severe diverticulosis of the sigmoid.  Genitourinary: Negative for hematuria, vaginal bleeding and difficulty urinating.  Musculoskeletal: Positive for myalgias, joint swelling, arthralgias and gait problem.  Skin: Negative for rash and wound.  Neurological: Negative for seizures, syncope and headaches.  Hematological: Negative for adenopathy. Does not bruise/bleed easily.  Psychiatric/Behavioral: Negative for confusion.    Blood pressure 148/58, pulse 82, temperature 96.9 F (36.1 C), temperature source Temporal, resp. rate 18, height 5\' 3"  (1.6 m),  weight 329 lb 3.2 oz (149.324 kg).  Physical Exam Physical Exam  Constitutional: She is oriented to person, place, and time. She appears well-developed and well-nourished. No distress.  BMI 58  HENT:  Head: Normocephalic and atraumatic.  Nose: Nose normal.  Mouth/Throat: No oropharyngeal exudate.  Eyes: EOM are normal.  Neck: Neck supple.  Cardiovascular: Normal rate, regular rhythm, normal heart sounds and intact distal pulses.   No murmur heard. Pulmonary/Chest: Effort normal and breath sounds normal. No respiratory distress. She has no wheezes. She has no rales. She exhibits no tenderness.  Abdominal: Soft. Bowel sounds are normal. She exhibits no distension and no mass. There is no tenderness. There is no rebound and no guarding.  Morbidly obese. Small umbilical hernia with deep recess circumferentially and a little bit of maceration of the skin. A little bit of dried blood. Examined with Q. Tips and peroxide and there is a slight maceration of epidermis but no purulence, no cellulitis. Cleaned with peroxide. Dry gauze placed into the depths of the wound and patient educated about this. Not really tender.  Musculoskeletal: She exhibits edema. She exhibits no tenderness.  Cellulitis left ankle and above. Erythema and induration but no skin breakdown. Bilateral ankle edema.  Ambulates slowly with a cane. Moves onto the exam table very slowly.  Neurological: She is alert and oriented to person, place, and time. She exhibits normal muscle tone. Coordination normal.  Skin: Skin is warm. No rash noted. She is not diaphoretic. No erythema. No pallor.  Psychiatric: She has a normal mood and affect. Her behavior is normal. Judgment and thought content normal.    Data Reviewed Old records. Old imaging studies. Dr. Marvell Fuller notes. Dr. Sherolyn Buba.  Assessment    Small umbilical hernia, complicated by skin maceration due to moisture and obesity. No evidence of incarcerated GI tract. She is  very high risk for wound complications for any surgical procedure, including her umbilical hernia.  Morbid obesity  Insulin-dependent diabetes  Hypertension  Degenerative joint disease  Severe diverticulosis  Recurrent, acute cellulitis left lower extremity     Plan    She was instructed on how to care for the skin of the umbilicus with peroxide and dry gauze try to get this to clean up and get the skin to heal over.  I told her that we could not perform any elective hernia surgery with mesh until all the infections, including her cellulitis, had resolved  CT abdomen and pelvis now to make sure there is no  Intra-abdominal complication of her  hernia, which clinically there is not.  Return to see me in 6 weeks for reevaluation.       Angelia Mould. Derrell Lolling, M.D., [facility] General and Minimally invasive Surgery Breast and Colorectal Surgery Office:   [phone] Pager:   [phone]  04/12/2012, 5:00 PM

## 2012-04-12 NOTE — Patient Instructions (Signed)
Your  umbilical hernia is getting irritated and inflamed because it is staying moist and is in a deep hole. It is not grossly infected but it could get that way.  Clean this with peroxide 3 times a day and tuck a dry piece of fine mesh gauze down in the opening to keep it dry, As instructed.  Take the  doxycycline for 14 days as prescribed  You will be scheduled for CT scan of the abdomen and pelvis to see if there is any change in the contents of your hernia  We cannot repair your umbilical hernia until all the infections, including the cellulitis of your leg, are controlled.  Return to see Dr. Derrell Lolling in 6 weeks.

## 2012-04-13 ENCOUNTER — Telehealth (INDEPENDENT_AMBULATORY_CARE_PROVIDER_SITE_OTHER): Payer: Self-pay | Admitting: General Surgery

## 2012-04-13 NOTE — Telephone Encounter (Signed)
Called and left message for patient to advise information regarding test will be mailed to her.   Written instruction included date, time of arrival and to pick up the contrast prior to appointment. Instructed when it was to be taken also.

## 2012-05-02 ENCOUNTER — Inpatient Hospital Stay (HOSPITAL_COMMUNITY)
Admission: EM | Admit: 2012-05-02 | Discharge: 2012-05-07 | DRG: 159 | Disposition: A | Discharge status: Home / Self Care | Payer: BC Managed Care – PPO | Attending: Internal Medicine | Admitting: Internal Medicine

## 2012-05-02 ENCOUNTER — Encounter (HOSPITAL_COMMUNITY): Payer: Self-pay | Admitting: Emergency Medicine

## 2012-05-02 ENCOUNTER — Emergency Department (HOSPITAL_COMMUNITY): Payer: BC Managed Care – PPO

## 2012-05-02 ENCOUNTER — Encounter (HOSPITAL_COMMUNITY): Payer: Self-pay | Admitting: Anesthesiology

## 2012-05-02 ENCOUNTER — Inpatient Hospital Stay (HOSPITAL_COMMUNITY): Payer: BC Managed Care – PPO | Admitting: Anesthesiology

## 2012-05-02 ENCOUNTER — Encounter (HOSPITAL_COMMUNITY)
Admission: EM | Disposition: A | Discharge status: Home / Self Care | Payer: Self-pay | Source: Home / Self Care | Attending: Internal Medicine

## 2012-05-02 DIAGNOSIS — Z794 Long term (current) use of insulin: Secondary | ICD-10-CM

## 2012-05-02 DIAGNOSIS — R609 Edema, unspecified: Secondary | ICD-10-CM | POA: Diagnosis not present

## 2012-05-02 DIAGNOSIS — R109 Unspecified abdominal pain: Secondary | ICD-10-CM

## 2012-05-02 DIAGNOSIS — R1032 Left lower quadrant pain: Secondary | ICD-10-CM | POA: Diagnosis present

## 2012-05-02 DIAGNOSIS — I10 Essential (primary) hypertension: Secondary | ICD-10-CM | POA: Diagnosis present

## 2012-05-02 DIAGNOSIS — R112 Nausea with vomiting, unspecified: Secondary | ICD-10-CM

## 2012-05-02 DIAGNOSIS — K436 Other and unspecified ventral hernia with obstruction, without gangrene: Principal | ICD-10-CM | POA: Diagnosis present

## 2012-05-02 DIAGNOSIS — L02419 Cutaneous abscess of limb, unspecified: Secondary | ICD-10-CM | POA: Diagnosis present

## 2012-05-02 DIAGNOSIS — Z79899 Other long term (current) drug therapy: Secondary | ICD-10-CM

## 2012-05-02 DIAGNOSIS — E785 Hyperlipidemia, unspecified: Secondary | ICD-10-CM | POA: Diagnosis present

## 2012-05-02 DIAGNOSIS — Z6841 Body Mass Index (BMI) 40.0 and over, adult: Secondary | ICD-10-CM

## 2012-05-02 DIAGNOSIS — R42 Dizziness and giddiness: Secondary | ICD-10-CM | POA: Diagnosis not present

## 2012-05-02 DIAGNOSIS — M109 Gout, unspecified: Secondary | ICD-10-CM | POA: Diagnosis present

## 2012-05-02 DIAGNOSIS — E876 Hypokalemia: Secondary | ICD-10-CM | POA: Diagnosis not present

## 2012-05-02 DIAGNOSIS — IMO0001 Reserved for inherently not codable concepts without codable children: Secondary | ICD-10-CM | POA: Diagnosis present

## 2012-05-02 DIAGNOSIS — D72829 Elevated white blood cell count, unspecified: Secondary | ICD-10-CM | POA: Diagnosis present

## 2012-05-02 HISTORY — PX: VENTRAL HERNIA REPAIR: SHX424

## 2012-05-02 HISTORY — DX: Unspecified osteoarthritis, unspecified site: M19.90

## 2012-05-02 LAB — CBC WITH DIFFERENTIAL/PLATELET
Basophils Absolute: 0 10*3/uL (ref 0.0–0.1)
Eosinophils Absolute: 0 10*3/uL (ref 0.0–0.7)
Eosinophils Relative: 0 % (ref 0–5)
Lymphs Abs: 1 10*3/uL (ref 0.7–4.0)
MCH: 27 pg (ref 26.0–34.0)
MCV: 80.3 fL (ref 78.0–100.0)
Neutrophils Relative %: 87 % — ABNORMAL HIGH (ref 43–77)
Platelets: 276 10*3/uL (ref 150–400)
RBC: 5.07 MIL/uL (ref 3.87–5.11)
RDW: 14.7 % (ref 11.5–15.5)
WBC: 13.3 10*3/uL — ABNORMAL HIGH (ref 4.0–10.5)

## 2012-05-02 LAB — URINALYSIS, ROUTINE W REFLEX MICROSCOPIC
Bilirubin Urine: NEGATIVE
Glucose, UA: NEGATIVE mg/dL
Hgb urine dipstick: NEGATIVE
Ketones, ur: NEGATIVE mg/dL
Nitrite: NEGATIVE
Specific Gravity, Urine: >1.046 — ABNORMAL HIGH (ref 1.005–1.030)
pH: 5.5 (ref 5.0–8.0)

## 2012-05-02 LAB — COMPREHENSIVE METABOLIC PANEL
ALT: 14 U/L (ref 0–35)
AST: 17 U/L (ref 0–37)
Albumin: 3.3 g/dL — ABNORMAL LOW (ref 3.5–5.2)
Alkaline Phosphatase: 83 U/L (ref 39–117)
Calcium: 9.4 mg/dL (ref 8.4–10.5)
Potassium: 3.7 mEq/L (ref 3.5–5.1)
Sodium: 134 mEq/L — ABNORMAL LOW (ref 135–145)
Total Protein: 6.9 g/dL (ref 6.0–8.3)

## 2012-05-02 LAB — LACTIC ACID, PLASMA: Lactic Acid, Venous: 1.7 mmol/L (ref 0.5–2.2)

## 2012-05-02 SURGERY — REPAIR, HERNIA, VENTRAL, LAPAROSCOPIC
Anesthesia: General | Wound class: Clean

## 2012-05-02 MED ORDER — PHENYLEPHRINE HCL 10 MG/ML IJ SOLN
10.0000 mg | INTRAVENOUS | Status: DC | PRN
  Administered 2012-05-02: 50 ug/min via INTRAVENOUS

## 2012-05-02 MED ORDER — PROMETHAZINE HCL 25 MG/ML IJ SOLN: 12.5000 mg | Freq: Four times a day (QID) | INTRAMUSCULAR | Status: DC | PRN

## 2012-05-02 MED ORDER — LORAZEPAM BOLUS VIA INFUSION: 0.5000 mg | Freq: Three times a day (TID) | INTRAVENOUS | Status: DC | PRN

## 2012-05-02 MED ORDER — SUCCINYLCHOLINE CHLORIDE 20 MG/ML IJ SOLN
INTRAMUSCULAR | Status: DC | PRN
  Administered 2012-05-02: 100 mg via INTRAVENOUS

## 2012-05-02 MED ORDER — METRONIDAZOLE IN NACL 5-0.79 MG/ML-% IV SOLN
500.0000 mg | INTRAVENOUS | Status: AC
  Administered 2012-05-02: 500 mg via INTRAVENOUS
  Filled 2012-05-02: qty 100

## 2012-05-02 MED ORDER — BISACODYL 10 MG RE SUPP: 10.0000 mg | Freq: Two times a day (BID) | RECTAL | Status: DC | PRN

## 2012-05-02 MED ORDER — LORAZEPAM 2 MG/ML IJ SOLN: 0.5000 mg | Freq: Three times a day (TID) | INTRAMUSCULAR | Status: DC | PRN

## 2012-05-02 MED ORDER — SODIUM CHLORIDE 0.9 % IV SOLN: INTRAVENOUS | Status: DC

## 2012-05-02 MED ORDER — CEFAZOLIN SODIUM 10 G IJ SOLR
3.0000 g | INTRAMUSCULAR | Status: AC
  Administered 2012-05-02: 3 g via INTRAVENOUS

## 2012-05-02 MED ORDER — ONDANSETRON 8 MG/NS 50 ML IVPB
8.0000 mg | Freq: Four times a day (QID) | INTRAVENOUS | Status: DC | PRN
  Administered 2012-05-04: 8 mg via INTRAVENOUS
  Filled 2012-05-02 (×2): qty 8

## 2012-05-02 MED ORDER — CEFAZOLIN SODIUM-DEXTROSE 2-3 GM-% IV SOLR: 2.0000 g | INTRAVENOUS | Status: DC

## 2012-05-02 MED ORDER — CEFAZOLIN SODIUM-DEXTROSE 2-3 GM-% IV SOLR
INTRAVENOUS | Status: AC
  Filled 2012-05-02: qty 50

## 2012-05-02 MED ORDER — SODIUM CHLORIDE 0.9 % IV SOLN
Freq: Once | INTRAVENOUS | Status: AC
  Administered 2012-05-02: 16:00:00 via INTRAVENOUS

## 2012-05-02 MED ORDER — IOHEXOL 300 MG/ML  SOLN
50.0000 mL | Freq: Once | INTRAMUSCULAR | Status: AC | PRN
  Administered 2012-05-02: 50 mL via ORAL

## 2012-05-02 MED ORDER — LACTATED RINGERS IV BOLUS (SEPSIS)
1000.0000 mL | Freq: Once | INTRAVENOUS | Status: DC
  Administered 2012-05-03: 1000 mL via INTRAVENOUS

## 2012-05-02 MED ORDER — ONDANSETRON HCL 4 MG/2ML IJ SOLN: 4.0000 mg | Freq: Four times a day (QID) | INTRAMUSCULAR | Status: DC | PRN

## 2012-05-02 MED ORDER — INSULIN ASPART 100 UNIT/ML ~~LOC~~ SOLN
0.0000 [IU] | Freq: Four times a day (QID) | SUBCUTANEOUS | Status: DC
  Administered 2012-05-03 (×2): 2 [IU] via SUBCUTANEOUS
  Administered 2012-05-03: 3 [IU] via SUBCUTANEOUS
  Administered 2012-05-04: 1 [IU] via SUBCUTANEOUS
  Administered 2012-05-04: 2 [IU] via SUBCUTANEOUS
  Administered 2012-05-05: 3 [IU] via SUBCUTANEOUS
  Administered 2012-05-05 – 2012-05-06 (×3): 2 [IU] via SUBCUTANEOUS
  Administered 2012-05-06 (×2): 3 [IU] via SUBCUTANEOUS
  Administered 2012-05-06: 2 [IU] via SUBCUTANEOUS

## 2012-05-02 MED ORDER — PHENOL 1.4 % MT LIQD
2.0000 | OROMUCOSAL | Status: DC | PRN
  Filled 2012-05-02: qty 177

## 2012-05-02 MED ORDER — PROPOFOL 10 MG/ML IV BOLUS
INTRAVENOUS | Status: DC | PRN
  Administered 2012-05-02: 200 mg via INTRAVENOUS

## 2012-05-02 MED ORDER — INSULIN GLARGINE 100 UNIT/ML ~~LOC~~ SOLN
20.0000 [IU] | Freq: Two times a day (BID) | SUBCUTANEOUS | Status: DC
  Administered 2012-05-03: 20 [IU] via SUBCUTANEOUS

## 2012-05-02 MED ORDER — HYDROMORPHONE HCL PF 1 MG/ML IJ SOLN: 0.5000 mg | INTRAMUSCULAR | Status: DC | PRN

## 2012-05-02 MED ORDER — BUPIVACAINE-EPINEPHRINE 0.25% -1:200000 IJ SOLN
INTRAMUSCULAR | Status: AC
  Filled 2012-05-02: qty 1

## 2012-05-02 MED ORDER — IOHEXOL 300 MG/ML  SOLN
100.0000 mL | Freq: Once | INTRAMUSCULAR | Status: AC | PRN
  Administered 2012-05-02: 100 mL via INTRAVENOUS

## 2012-05-02 MED ORDER — CEFAZOLIN SODIUM 1-5 GM-% IV SOLN
INTRAVENOUS | Status: AC
  Filled 2012-05-02: qty 50

## 2012-05-02 MED ORDER — MENTHOL 3 MG MT LOZG
1.0000 | LOZENGE | OROMUCOSAL | Status: DC | PRN
  Filled 2012-05-02: qty 9

## 2012-05-02 MED ORDER — SODIUM CHLORIDE 0.9 % IV SOLN
3.0000 g | Freq: Four times a day (QID) | INTRAVENOUS | Status: DC
  Filled 2012-05-02 (×2): qty 3

## 2012-05-02 MED ORDER — SODIUM CHLORIDE 0.9 % IV SOLN
INTRAVENOUS | Status: DC
  Administered 2012-05-03: 150 mL via INTRAVENOUS
  Administered 2012-05-03 – 2012-05-06 (×7): via INTRAVENOUS

## 2012-05-02 MED ORDER — ALUM & MAG HYDROXIDE-SIMETH 200-200-20 MG/5ML PO SUSP: 30.0000 mL | Freq: Four times a day (QID) | ORAL | Status: DC | PRN

## 2012-05-02 MED ORDER — ACETAMINOPHEN 325 MG PO TABS: 325.0000 mg | ORAL_TABLET | Freq: Four times a day (QID) | ORAL | Status: DC | PRN

## 2012-05-02 MED ORDER — LIP MEDEX EX OINT: 1.0000 "application " | TOPICAL_OINTMENT | Freq: Two times a day (BID) | CUTANEOUS | Status: DC

## 2012-05-02 MED ORDER — DIPHENHYDRAMINE HCL 50 MG/ML IJ SOLN: 12.5000 mg | Freq: Four times a day (QID) | INTRAMUSCULAR | Status: DC | PRN

## 2012-05-02 MED ORDER — FENTANYL CITRATE 0.05 MG/ML IJ SOLN
INTRAMUSCULAR | Status: DC | PRN
  Administered 2012-05-02: 100 ug via INTRAVENOUS
  Administered 2012-05-02 – 2012-05-03 (×3): 50 ug via INTRAVENOUS

## 2012-05-02 MED ORDER — LACTATED RINGERS IR SOLN
Status: DC | PRN
  Administered 2012-05-02 – 2012-05-03 (×2): 1000 mL

## 2012-05-02 MED ORDER — MAGIC MOUTHWASH
15.0000 mL | Freq: Four times a day (QID) | ORAL | Status: DC | PRN
  Filled 2012-05-02: qty 15

## 2012-05-02 MED ORDER — ONDANSETRON HCL 4 MG PO TABS: 4.0000 mg | ORAL_TABLET | Freq: Four times a day (QID) | ORAL | Status: DC | PRN

## 2012-05-02 MED ORDER — HYDROMORPHONE HCL PF 1 MG/ML IJ SOLN: 1.0000 mg | INTRAMUSCULAR | Status: DC | PRN

## 2012-05-02 MED ORDER — HYDROMORPHONE HCL PF 1 MG/ML IJ SOLN
1.0000 mg | Freq: Once | INTRAMUSCULAR | Status: AC
  Administered 2012-05-02: 1 mg via INTRAVENOUS
  Filled 2012-05-02: qty 1

## 2012-05-02 MED ORDER — METOPROLOL TARTRATE 1 MG/ML IV SOLN
5.0000 mg | Freq: Four times a day (QID) | INTRAVENOUS | Status: DC | PRN
  Filled 2012-05-02: qty 5

## 2012-05-02 MED ORDER — ONDANSETRON HCL 4 MG/2ML IJ SOLN
4.0000 mg | Freq: Once | INTRAMUSCULAR | Status: AC
  Administered 2012-05-02: 4 mg via INTRAVENOUS
  Filled 2012-05-02: qty 2

## 2012-05-02 MED ORDER — BUPIVACAINE-EPINEPHRINE 0.25% -1:200000 IJ SOLN
INTRAMUSCULAR | Status: DC | PRN
  Administered 2012-05-02: 50 mL

## 2012-05-02 MED ORDER — METRONIDAZOLE IN NACL 5-0.79 MG/ML-% IV SOLN
INTRAVENOUS | Status: AC
  Filled 2012-05-02: qty 100

## 2012-05-02 MED ORDER — ACETAMINOPHEN 650 MG RE SUPP: 650.0000 mg | Freq: Four times a day (QID) | RECTAL | Status: DC | PRN

## 2012-05-02 MED ORDER — ROCURONIUM BROMIDE 100 MG/10ML IV SOLN
INTRAVENOUS | Status: DC | PRN
  Administered 2012-05-02: 50 mg via INTRAVENOUS
  Administered 2012-05-03 (×3): 10 mg via INTRAVENOUS

## 2012-05-02 SURGICAL SUPPLY — 44 items
APPLIER CLIP 5 13 M/L LIGAMAX5 (MISCELLANEOUS)
BINDER ABD UNIV 12 45-62 (WOUND CARE) ×2 IMPLANT
BINDER ABDOMINAL 46IN 62IN (WOUND CARE) ×4
CANISTER SUCTION 2500CC (MISCELLANEOUS) ×2 IMPLANT
CATH KIT ON-Q SILVERSOAK 7.5IN (CATHETERS) IMPLANT
CLIP APPLIE 5 13 M/L LIGAMAX5 (MISCELLANEOUS) IMPLANT
CLOTH BEACON ORANGE TIMEOUT ST (SAFETY) ×2 IMPLANT
CLSR STERI-STRIP ANTIMIC 1/2X4 (GAUZE/BANDAGES/DRESSINGS) ×2 IMPLANT
DECANTER SPIKE VIAL GLASS SM (MISCELLANEOUS) ×2 IMPLANT
DEVICE SECURE STRAP 25 ABSORB (INSTRUMENTS) ×2 IMPLANT
DEVICE TROCAR PUNCTURE CLOSURE (ENDOMECHANICALS) IMPLANT
DRAPE LAPAROSCOPIC ABDOMINAL (DRAPES) ×2 IMPLANT
DRSG TEGADERM 2-3/8X2-3/4 SM (GAUZE/BANDAGES/DRESSINGS) ×6 IMPLANT
ELECT REM PT RETURN 9FT ADLT (ELECTROSURGICAL) ×2
ELECTRODE REM PT RTRN 9FT ADLT (ELECTROSURGICAL) ×1 IMPLANT
FILTER SMOKE EVAC LAPAROSHD (FILTER) IMPLANT
GAUZE SPONGE 2X2 8PLY STRL LF (GAUZE/BANDAGES/DRESSINGS) IMPLANT
GLOVE ECLIPSE 8.0 STRL XLNG CF (GLOVE) ×2 IMPLANT
GLOVE INDICATOR 8.0 STRL GRN (GLOVE) ×4 IMPLANT
GOWN STRL NON-REIN LRG LVL3 (GOWN DISPOSABLE) ×2 IMPLANT
GOWN STRL REIN XL XLG (GOWN DISPOSABLE) ×4 IMPLANT
HAND ACTIVATED (MISCELLANEOUS) IMPLANT
KIT BASIN OR (CUSTOM PROCEDURE TRAY) ×2 IMPLANT
MARKER SKIN DUAL TIP RULER LAB (MISCELLANEOUS) ×2 IMPLANT
MESH PHYSIO OVAL 15X20CM (Mesh General) ×2 IMPLANT
NEEDLE SPNL 22GX3.5 QUINCKE BK (NEEDLE) ×2 IMPLANT
NS IRRIG 1000ML POUR BTL (IV SOLUTION) ×2 IMPLANT
PENCIL BUTTON HOLSTER BLD 10FT (ELECTRODE) IMPLANT
SCISSORS LAP 5X35 DISP (ENDOMECHANICALS) ×2 IMPLANT
SET IRRIG TUBING LAPAROSCOPIC (IRRIGATION / IRRIGATOR) IMPLANT
SLEEVE Z-THREAD 5X100MM (TROCAR) ×4 IMPLANT
SPONGE GAUZE 2X2 STER 10/PKG (GAUZE/BANDAGES/DRESSINGS)
STRIP CLOSURE SKIN 1/2X4 (GAUZE/BANDAGES/DRESSINGS) IMPLANT
SUT MNCRL AB 4-0 PS2 18 (SUTURE) ×2 IMPLANT
SUT PROLENE 1 CT 1 30 (SUTURE) ×12 IMPLANT
SUT VIC AB 2-0 UR6 27 (SUTURE) IMPLANT
TOWEL OR 17X26 10 PK STRL BLUE (TOWEL DISPOSABLE) ×2 IMPLANT
TRAY FOLEY CATH 14FRSI W/METER (CATHETERS) IMPLANT
TRAY LAP CHOLE (CUSTOM PROCEDURE TRAY) ×2 IMPLANT
TROCAR XCEL BLADELESS 5X75MML (TROCAR) ×2 IMPLANT
TROCAR Z-THREAD FIOS 11X100 BL (TROCAR) ×2 IMPLANT
TROCAR Z-THREAD FIOS 5X100MM (TROCAR) ×2 IMPLANT
TUBING INSUFFLATION 10FT LAP (TUBING) ×2 IMPLANT
TUNNELER SHEATH ON-Q 16GX12 DP (PAIN MANAGEMENT) ×2 IMPLANT

## 2012-05-02 NOTE — ED Provider Notes (Signed)
History     CSN: [id number]  Arrival date & time 05/02/12  1355   First MD Initiated Contact with Patient 05/02/12 1549      Chief Complaint  Patient presents with  . Abdominal Pain  . Emesis    (Consider location/radiation/quality/duration/timing/severity/associated sxs/prior treatment) Patient is a 72 y.o. female presenting with abdominal pain and vomiting. The history is provided by the patient.  Abdominal Pain Associated symptoms: vomiting   Emesis Associated symptoms: abdominal pain   She had onset yesterday of mid abdominal pain, nausea, vomiting. She started with periumbilical pain which radiated up towards the epigastric area and into the lower abdomen. Pain is crampy and has been 10/10 at its worst, 8/10 currently. Pain was followed by nausea and vomiting. She gets very temporary relief of pain with vomiting. She had a bowel movement yesterday and has passed flatus today. There's no change of pain with passing flatus. She has an umbilical hernia which is being followed by a neurosurgeon. She denies fever, chills, sweats. There's been no radiation of pain to the chest, shoulders, back. She has not taken any medication to try and help her pain because of the ongoing nausea and vomiting. Also, of note, she is being treated for cellulitis of her legs which has shown significant and improving. She had been given a course of doxycycline and this was followed by a course of Augmentin. Past Medical History  Diagnosis Date  . S/P appy   . Diabetes mellitus   . Hypertension   . Hyperlipidemia   . Cellulitis of left lower extremity     Begin antibiotic 04/12/12  . Gout     Past Surgical History  Procedure Laterality Date  . Vaginal hysterectomy    . Ectopic pregnancy surgery    . Colonoscopy  2003, 12/17/10    2003: small adenoma 2012: small adenoma (6-20mm) and diverticulosis  . Appendectomy    . Tonsillectomy and adenoidectomy      No family history on file.  History   Substance Use Topics  . Smoking status: Never Smoker   . Smokeless tobacco: Never Used  . Alcohol Use: No    OB History   Grav Para Term Preterm Abortions TAB SAB Ect Mult Living                  Review of Systems  Gastrointestinal: Positive for vomiting and abdominal pain.  All other systems reviewed and are negative.    Allergies  Codeine and Sulfonamide derivatives  Home Medications   Current Outpatient Rx  Name  Route  Sig  Dispense  Refill  . amoxicillin-clavulanate (AUGMENTIN) 875-125 MG per tablet   Oral   Take 1 tablet by mouth 2 (two) times daily.         Marland Kitchen HYDROcodone-acetaminophen (NORCO/VICODIN) 5-325 MG per tablet   Oral   Take 1 tablet by mouth 2 (two) times daily as needed for pain. Takes one daily.         . insulin aspart (NOVOLOG) 100 UNIT/ML injection   Subcutaneous   Inject into the skin 3 (three) times daily before meals. Sliding scale          . insulin glargine (LANTUS) 100 UNIT/ML injection   Subcutaneous   Inject 20-40 Units into the skin 2 (two) times daily. Takes 20units qAM and 40units qhs.         . meloxicam (MOBIC) 15 MG tablet   Oral   Take 15 mg by mouth daily.         Marland Kitchen  potassium gluconate 595 MG TABS   Oral   Take 595 mg by mouth daily.           . simvastatin (ZOCOR) 10 MG tablet   Oral   Take 10 mg by mouth at bedtime.           . torsemide (DEMADEX) 20 MG tablet   Oral   Take 20 mg by mouth daily.           . traMADol (ULTRAM) 50 MG tablet   Oral   Take 50 mg by mouth every 8 (eight) hours as needed for pain.         . valsartan (DIOVAN) 160 MG tablet   Oral   Take 160 mg by mouth daily.             BP 183/81  Pulse 85  Temp(Src) 98.9 F (37.2 C) (Oral)  Resp 18  SpO2 9%  Physical Exam  Nursing note and vitals reviewed.  Morbidly obese 71 year old female, resting comfortably and in no acute distress. Vital signs are  significant for hypertension with blood pressure 183/81. Oxygen  saturation is 94%, which is normal. Head is normocephalic and atraumatic. PERRLA, EOMI. Oropharynx is clear. Neck is nontender and supple without adenopathy or JVD. Back is nontender and there is no CVA tenderness. Lungs are clear without rales, wheezes, or rhonchi. Chest is nontender. Heart has regular rate and rhythm without murmur. Abdomen is piece soft, flat,  With mild to moderate tenderness across the periumbilical area and epigastric area. There is no rebound or guarding. There is no focal hernia which is nontender and easily reducible. There is no hepatosplenomegaly and peristalsis is hypoactive. Extremities have 2-3+ edema, full range of motion is present. mild erythema is present on the left lower leg consistent with resolving cellulitis.  Skin is warm and dry without other rash. Neurologic: Mental status is normal, cranial nerves are intact, there are no motor or sensory deficits.  ED Course  Procedures (including critical care time)  Results for orders placed during the hospital encounter of 05/02/12  CBC WITH DIFFERENTIAL      Result Value Range   WBC 13.3 (*) 4.0 - 10.5 K/uL   RBC 5.07  3.87 - 5.11 MIL/uL   Hemoglobin 13.7  12.0 - 15.0 g/dL   HCT 16.1  09.6 - 04.5 %   MCV 80.3  78.0 - 100.0 fL   MCH 27.0  26.0 - 34.0 pg   MCHC 33.7  30.0 - 36.0 g/dL   RDW 40.9  81.1 - 91.4 %   Platelets 276  150 - 400 K/uL   Neutrophils Relative 87 (*) 43 - 77 %   Neutro Abs 11.7 (*) 1.7 - 7.7 K/uL   Lymphocytes Relative 8 (*) 12 - 46 %   Lymphs Abs 1.0  0.7 - 4.0 K/uL   Monocytes Relative 5  3 - 12 %   Monocytes Absolute 0.7  0.1 - 1.0 K/uL   Eosinophils Relative 0  0 - 5 %   Eosinophils Absolute 0.0  0.0 - 0.7 K/uL   Basophils Relative 0  0 - 1 %   Basophils Absolute 0.0  0.0 - 0.1 K/uL  COMPREHENSIVE METABOLIC PANEL      Result Value Range   Sodium 134 (*) 135 - 145 mEq/L   Potassium 3.7  3.5 - 5.1 mEq/L   Chloride 96  96 - 112 mEq/L   CO2 27  19 - 32  mEq/L   Glucose, Bld  210 (*) 70 - 99 mg/dL   BUN 22  6 - 23 mg/dL   Creatinine, Ser 6.29  0.50 - 1.10 mg/dL   Calcium 9.4  8.4 - 52.8 mg/dL   Total Protein 6.9  6.0 - 8.3 g/dL   Albumin 3.3 (*) 3.5 - 5.2 g/dL   AST 17  0 - 37 U/L   ALT 14  0 - 35 U/L   Alkaline Phosphatase 83  39 - 117 U/L   Total Bilirubin 1.0  0.3 - 1.2 mg/dL   GFR calc non Af Amer >90  >90 mL/min   GFR calc Af Amer >90  >90 mL/min  LIPASE, BLOOD      Result Value Range   Lipase 10 (*) 11 - 59 U/L  LACTIC ACID, PLASMA      Result Value Range   Lactic Acid, Venous 1.7  0.5 - 2.2 mmol/L   Ct Abdomen Pelvis W Contrast  05/02/2012  *RADIOLOGY REPORT*  Clinical Data: Abdominal pain and vomiting for 1 day.  CT ABDOMEN AND PELVIS WITH CONTRAST  Technique:  Multidetector CT imaging of the abdomen and pelvis was performed following the standard protocol during bolus administration of intravenous contrast.  Contrast: OMNIPAQUE IOHEXOL 300 MG/ML  SOLN  Comparison: CT abdomen pelvis 04/25/2010.  Findings: The lung bases are clear.  No pleural or pericardial effusion.  Cardiomegaly is noted.  The liver, gallbladder, adrenal glands, spleen, pancreas and kidneys appear normal.  As seen on comparison study, the patient has a midline ventral hernia.  Hernia opening measures 4.6 cm transverse by 3.7 cm cranial-caudal.  New since the prior exam, a knuckle of transverse colon is contained within the hernia as well as omental fat.  There is gas and stool in the colon distal to the hernia although distal colon appears relatively decompressed compared to the ascending and proximal transverse colon which is markedly distended with a large volume of stool present.  No pneumatosis, free air, portal venous gas or fluid collection is identified. Sigmoid diverticulosis without diverticulitis is noted.  Stomach and small bowel appear normal.  The appendix is not visualized and may have been removed.  The urinary bladder is unremarkable.  No lymphadenopathy is identified.  No focal bony abnormality is seen with multilevel thoracic and lumbar spondylosis noted.  IMPRESSION:  1.  Midline ventral hernia contains a knuckle of transverse colon which is new since the prior CT.  The ascending and proximal transverse colon are distended with a large volume of stool present. The colon distal to the hernia is relatively decompressed although there is some gas and stool present.  Findings suggest partial or early colonic obstruction due to the hernia. 2.  Diverticulosis without diverticulitis.   Original Report Authenticated By: Holley Dexter, M.D.    Images viewed by me.   1. Umbilical hernia with obstruction       MDM  Abdominal pain with known umbilical hernia. This does not appear to be incarcerated. Her exam is generally benign but is difficult because of degree of adiposity. CT scan will be obtained. Old records are reviewed and she has been followed by [facility] for her umbilical hernia but they have been reluctant to do any kind of surgical procedures on her.  WBC is come back slightly elevated with left shift, a CT scan shows a loop of colon within the hernia and probable partial colon obstruction. Lactic acid level is normal. Case has been  discussed with Dr. Irene Limbo [facility] who agrees to admit the patient. Case is also discussed with Dr. Guadlupe Spanish of [facility] agrees to see the patient in consultation. She will be admitted for IV fluids, narcotics for pain control, antiemetics for nausea control, and observation make sure her obstruction is now progressed to complete obstruction.  Dione Booze, MD 05/02/12 [phone]

## 2012-05-02 NOTE — H&P (Signed)
History and Physical  [name] Bridges:[id number] DOB: [birth year] DOA: 05/02/2012  Referring physician: Dione Booze, MD PCP: Herb Grays, MD   Chief Complaint: Abdominal pain  HPI:  72 year old woman with known umbilical hernia presented with severe nausea, vomiting and abdominal pain. CT scan abdomen and pelvis revealed suspected colonic obstruction secondary to hernia. EDP has placed surgical consultation, referred for medical admission.  Long-standing umbilical hernia, evaluated as an outpatient 03/2012 for consideration of elective repair; given ongoing lower extremity cellulitis repair was deferred until resolution of infection.  Yesterday developed nausea and vomiting, left lower quadrant pain radiating to the umbilicus. Multiple episodes of vomiting and now dry heaves. Nothing to eat since yesterday. Maximum intensity of pain 10/10. No aggravating or alleviating factors. Continued to have dry heaves today and pain precipitating emergency department evaluation. Last bowel movement yesterday.  In ED  Afebrile, VSS  Screening labs notable for--WBC 13.3, normal lactic acid  CT abdomen/pelvis--Findings suggest partial or early colonic obstruction due to the hernia.  Treated with Dilaudid and Zofran  Review of Systems:  Negative for fever, visual changes, sore throat, rash, new muscle aches, chest pain, SOB, dysuria, bleeding.  Past Medical History  Diagnosis Date  . S/P appy   . Diabetes mellitus   . Hypertension   . Hyperlipidemia   . Cellulitis of left lower extremity     Begin antibiotic 04/12/12  . Gout     Past Surgical History  Procedure Laterality Date  . Vaginal hysterectomy    . Ectopic pregnancy surgery    . Colonoscopy  2003, 12/17/10    2003: small adenoma 2012: small adenoma (6-43mm) and diverticulosis  . Appendectomy    . Tonsillectomy and adenoidectomy      Social History:  reports that she has never smoked. She has never used smokeless tobacco. She  reports that she does not drink alcohol or use illicit drugs.  Allergies  Allergen Reactions  . Codeine Nausea And Vomiting  . Sulfonamide Derivatives Nausea And Vomiting    Family History  Problem Relation Age of Onset  . Diabetes Mother   . Stroke Father   . Diabetes Sister   . Diabetes Brother      Prior to Admission medications   Medication Sig Start Date End Date Taking? Authorizing Marelly Wehrman  amoxicillin-clavulanate (AUGMENTIN) 875-125 MG per tablet Take 1 tablet by mouth 2 (two) times daily. 04/23/12 05/06/12 Yes Historical Juda Lajeunesse, MD  HYDROcodone-acetaminophen (NORCO/VICODIN) 5-325 MG per tablet Take 1 tablet by mouth 2 (two) times daily as needed for pain. Takes one daily.   Yes Historical Jayesh Marbach, MD  insulin aspart (NOVOLOG) 100 UNIT/ML injection Inject into the skin 3 (three) times daily before meals. Sliding scale    Yes Historical Pervis Macintyre, MD  insulin glargine (LANTUS) 100 UNIT/ML injection Inject 20-40 Units into the skin 2 (two) times daily. Takes 20units qAM and 40units qhs.   Yes Historical Rohnan Bartleson, MD  meloxicam (MOBIC) 15 MG tablet Take 15 mg by mouth daily.   Yes Historical Shakeeta Godette, MD  potassium gluconate 595 MG TABS Take 595 mg by mouth daily.     Yes Historical Zac Torti, MD  simvastatin (ZOCOR) 10 MG tablet Take 10 mg by mouth at bedtime.     Yes Historical Kasara Schomer, MD  torsemide (DEMADEX) 20 MG tablet Take 20 mg by mouth daily.     Yes Historical Franco Duley, MD  traMADol (ULTRAM) 50 MG tablet Take 50 mg by mouth every 8 (eight) hours as needed for pain.  Yes Historical Macallister Ashmead, MD  valsartan (DIOVAN) 160 MG tablet Take 160 mg by mouth daily.     Yes Historical Mikaia Janvier, MD   Physical Exam: Filed Vitals:   05/02/12 1445 05/02/12 1829  BP: 183/81 137/61  Pulse: 85 77  Temp: 98.9 F (37.2 C)   TempSrc: Oral   Resp: 18   SpO2: 9% 95%    General:  Examined in the emergency department. Appears calm and nontoxic. Intermittently uncomfortable with  nausea. Eyes: Pupils, irises, lids appear unremarkable. Wears glasses. ENT: grossly normal hearing, lips & tongue Neck: no LAD, masses or thyromegaly Cardiovascular: RRR, no m/r/g. Bilateral LE edema 1-2+.Marland Kitchen Respiratory: CTA bilaterally, no w/r/r. Normal respiratory effort. Abdomen: Obese, soft, no rebound, no guarding. Mild to moderate left lower quadrant pain. Umbilical hernia not reduced. Skin: Minimal left lower extremity erythema between knee and ankle. Nontender. No fluctuance. Musculoskeletal: grossly normal tone BUE/BLE Psychiatric: grossly normal mood and affect, speech fluent and appropriate Neurologic: grossly non-focal.  Wt Readings from Last 3 Encounters:  04/12/12 149.324 kg (329 lb 3.2 oz)  12/17/10 143.337 kg (316 lb)  12/03/10 143.609 kg (316 lb 9.6 oz)    Labs on Admission:  Basic Metabolic Panel:  Recent Labs Lab 05/02/12 1604  NA 134*  K 3.7  CL 96  CO2 27  GLUCOSE 210*  BUN 22  CREATININE 0.58  CALCIUM 9.4    Liver Function Tests:  Recent Labs Lab 05/02/12 1604  AST 17  ALT 14  ALKPHOS 83  BILITOT 1.0  PROT 6.9  ALBUMIN 3.3*    Recent Labs Lab 05/02/12 1604  LIPASE 10*    CBC:  Recent Labs Lab 05/02/12 1604  WBC 13.3*  NEUTROABS 11.7*  HGB 13.7  HCT 40.7  MCV 80.3  PLT 276    Radiological Exams on Admission: Ct Abdomen Pelvis W Contrast  05/02/2012  *RADIOLOGY REPORT*  Clinical Data: Abdominal pain and vomiting for 1 day.  CT ABDOMEN AND PELVIS WITH CONTRAST  Technique:  Multidetector CT imaging of the abdomen and pelvis was performed following the standard protocol during bolus administration of intravenous contrast.  Contrast: OMNIPAQUE IOHEXOL 300 MG/ML  SOLN  Comparison: CT abdomen pelvis 04/25/2010.  Findings: The lung bases are clear.  No pleural or pericardial effusion.  Cardiomegaly is noted.  The liver, gallbladder, adrenal glands, spleen, pancreas and kidneys appear normal.  As seen on comparison study, the patient  has a midline ventral hernia.  Hernia opening measures 4.6 cm transverse by 3.7 cm cranial-caudal.  New since the prior exam, a knuckle of transverse colon is contained within the hernia as well as omental fat.  There is gas and stool in the colon distal to the hernia although distal colon appears relatively decompressed compared to the ascending and proximal transverse colon which is markedly distended with a large volume of stool present.  No pneumatosis, free air, portal venous gas or fluid collection is identified. Sigmoid diverticulosis without diverticulitis is noted.  Stomach and small bowel appear normal.  The appendix is not visualized and may have been removed.  The urinary bladder is unremarkable.  No lymphadenopathy is identified. No focal bony abnormality is seen with multilevel thoracic and lumbar spondylosis noted.  IMPRESSION:  1.  Midline ventral hernia contains a knuckle of transverse colon which is new since the prior CT.  The ascending and proximal transverse colon are distended with a large volume of stool present. The colon distal to the hernia is relatively decompressed although there  is some gas and stool present.  Findings suggest partial or early colonic obstruction due to the hernia. 2.  Diverticulosis without diverticulitis.   Original Report Authenticated By: Holley Dexter, M.D.      Principal Problem:   Umbilical hernia with obstruction Active Problems:   Severe obesity (BMI >= 40)   Nausea and vomiting   Abdominal  pain, other specified site   Cellulitis of left lower extremity   Hypertension   Assessment/Plan 1. Nausea, vomiting, abdominal pain: IV antiemetics, pain control; given severe nausea, dry this place NG tube. 2. Partial or early colonic obstruction due to the hernia: General surgery consulted by EDP, await evaluation. Plan as above. 3. Recent LLE extremity cellulitis: Per patient markedly improved, change to IV antibiotics secondary to obstruction. 4. DM  type 2: Sliding scale insulin. Continue Lantus to lower dose while n.p.o. 5. HTN: Stable. 6. MORBID OBESITY BMI 58   Code Status: Full code Family Communication: Discussed with husband at bedside Disposition Plan/Anticipated LOS: Admit inpatient, 2-4 days  Time spent: 55 minutes  Brendia Sacks, MD  [facility] Pager [phone] 05/02/2012, 7:30 PM

## 2012-05-02 NOTE — Anesthesia Preprocedure Evaluation (Signed)
Anesthesia Evaluation  Patient identified by MRN, date of birth, ID band Patient awake    Reviewed: Allergy & Precautions, H&P , Patient's Chart, lab work & pertinent test results, reviewed documented beta blocker date and time   History of Anesthesia Complications Negative for: history of anesthetic complications  Airway Mallampati: IV TM Distance: >3 FB Neck ROM: full    Dental no notable dental hx.    Pulmonary neg pulmonary ROS,  breath sounds clear to auscultation  Pulmonary exam normal       Cardiovascular Exercise Tolerance: Good hypertension, negative cardio ROS  Rhythm:regular Rate:Normal     Neuro/Psych negative neurological ROS  negative psych ROS   GI/Hepatic negative GI ROS, Neg liver ROS,   Endo/Other  negative endocrine ROSdiabetesMorbid obesity  Renal/GU negative Renal ROS     Musculoskeletal   Abdominal   Peds  Hematology negative hematology ROS (+)   Anesthesia Other Findings S/P appy     Diabetes mellitus        Hypertension     Hyperlipidemia        Cellulitis of left lower extremity   Begin antibiotic 04/12/12 Gout        Arthritis    Reproductive/Obstetrics negative OB ROS                           Anesthesia Physical Anesthesia Plan  ASA: III and emergent  Anesthesia Plan: General ETT   Post-op Pain Management:    Induction: Intravenous, Cricoid pressure planned and Rapid sequence  Airway Management Planned: Video Laryngoscope Planned  Additional Equipment:   Intra-op Plan:   Post-operative Plan:   Informed Consent: I have reviewed the patients History and Physical, chart, labs and discussed the procedure including the risks, benefits and alternatives for the proposed anesthesia with the patient or authorized representative who has indicated his/her understanding and acceptance.   Dental Advisory Given  Plan Discussed with: CRNA and  Surgeon  Anesthesia Plan Comments:         Anesthesia Quick Evaluation

## 2012-05-02 NOTE — Consult Note (Signed)
[name] Bridges  Dec 29, 1940 [id number]  CARE TEAM:  PCP: Herb Grays, MD  Outpatient Care Team: Patient Care Team: Herb Grays, MD as PCP - General (Family Medicine) Ernestene Mention, MD as Consulting Physician (General Surgery)  Inpatient Treatment Team: Treatment Team: Attending Provider: Dione Booze, MD; Registered Nurse: Belinda Fisher, RN; Technician: Merian Capron, EMT; Consulting Physician: Ernestene Mention, MD; Consulting Physician: Bishop Limbo, MD; Rounding Team: Marchelle Folks Doristine Johns, MD  This patient is a 72 y.o.female who presents today for surgical evaluation at the request of Dr. Preston Fleeting.   Reason for evaluation: Incarcerated umbilical hernia.  Nausea and vomiting.  Superiorly morbidly obese female with BMI almost 60.  Chronic umbilical hernia.  Was seen by my partner to consider elective repair.  Struggling cellulitis of lower extremities gradually improving on antibiotics.  Was due for followup to consider surgery.  Has developed severe nausea vomiting and abdominal pain.  Came emergency room.  CAT scan showing a knuckle of colon in the umbilical hernia.  Not reducible.  Patient complaining of severe pain.  10 out of 10.  Dry heaving.  Nasogastric tube placement still dry heaving and uncomfortable.  Quite miserable.  Husband in room.  Past Medical History  Diagnosis Date  . S/P appy   . Diabetes mellitus   . Hypertension   . Hyperlipidemia   . Cellulitis of left lower extremity     Begin antibiotic 04/12/12  . Gout     Past Surgical History  Procedure Laterality Date  . Vaginal hysterectomy    . Ectopic pregnancy surgery    . Colonoscopy  2003, 12/17/10    2003: small adenoma 2012: small adenoma (6-60mm) and diverticulosis  . Appendectomy    . Tonsillectomy and adenoidectomy      History   Social History  . Marital Status: Married    Spouse Name: N/A    Number of Children: N/A  . Years of Education: N/A   Occupational History  . Not on file.   Social History  Main Topics  . Smoking status: Never Smoker   . Smokeless tobacco: Never Used  . Alcohol Use: No  . Drug Use: No  . Sexually Active: Not on file   Other Topics Concern  . Not on file   Social History Narrative  . No narrative on file    Family History  Problem Relation Age of Onset  . Diabetes Mother   . Stroke Father   . Diabetes Sister   . Diabetes Brother     No current facility-administered medications for this encounter.   Current Outpatient Prescriptions  Medication Sig Dispense Refill  . amoxicillin-clavulanate (AUGMENTIN) 875-125 MG per tablet Take 1 tablet by mouth 2 (two) times daily.      Marland Kitchen HYDROcodone-acetaminophen (NORCO/VICODIN) 5-325 MG per tablet Take 1 tablet by mouth 2 (two) times daily as needed for pain. Takes one daily.      . insulin aspart (NOVOLOG) 100 UNIT/ML injection Inject into the skin 3 (three) times daily before meals. Sliding scale       . insulin glargine (LANTUS) 100 UNIT/ML injection Inject 20-40 Units into the skin 2 (two) times daily. Takes 20units qAM and 40units qhs.      . meloxicam (MOBIC) 15 MG tablet Take 15 mg by mouth daily.      . potassium gluconate 595 MG TABS Take 595 mg by mouth daily.        . simvastatin (ZOCOR) 10 MG tablet Take 10 mg  by mouth at bedtime.        . torsemide (DEMADEX) 20 MG tablet Take 20 mg by mouth daily.        . traMADol (ULTRAM) 50 MG tablet Take 50 mg by mouth every 8 (eight) hours as needed for pain.      . valsartan (DIOVAN) 160 MG tablet Take 160 mg by mouth daily.           Allergies  Allergen Reactions  . Codeine Nausea And Vomiting  . Sulfonamide Derivatives Nausea And Vomiting    ROS: Constitutional:  No fevers, chills, sweats.  Weight stable Eyes:  No vision changes, No discharge HENT:  No sore throats, nasal drainage Lymph: No neck swelling, No bruising easily Pulmonary:  No cough, productive sputum CV: No orthopnea, PND  Patient walks 10 minutes for about 1/4 miles without  difficulty.  No exertional chest/neck/shoulder/arm pain. GI: No personal nor family history of GI/colon cancer, inflammatory bowel disease, irritable bowel syndrome, allergy such as Celiac Sprue, dietary/dairy problems, colitis, ulcers nor gastritis.  No recent sick contacts/gastroenteritis.  No travel outside the country.  No changes in diet. Renal: No UTIs, No hematuria Genital:  No drainage, bleeding, masses Musculoskeletal: No severe joint pain.  Good ROM major joints Skin:  No sores or lesions.  No rashes Heme/Lymph:  No easy bleeding.  No swollen lymph nodes Neuro: No focal weakness/numbness.  No seizures Psych: No suicidal ideation.  No hallucinations  BP 137/61  Pulse 77  Temp(Src) 98.9 F (37.2 C) (Oral)  Resp 18  SpO2 95%  Physical Exam: General: Pt awake/alert/oriented x4 in moderate distress but consolable Eyes: PERRL, normal EOM. Sclera nonicteric Neuro: CN II-XII intact w/o focal sensory/motor deficits. Lymph: No head/neck/groin lymphadenopathy Psych:  No delerium/psychosis/paranoia HENT: Normocephalic, Mucus membranes moist.  No thrush.  Small nasogastric tube in back of throat.  Coughing retching miserable.  I went and removed.  She felt markedly better. Neck: Supple, No tracheal deviation Chest: No pain.  Good respiratory excursion. CV:  Pulses intact.  Regular rhythm Abdomen: Large panniculus.  Super obese apple body habitus.  Soft but distended.  TTP at umbilicus - purple discoloration.  Not able to fully reduce hernia. Uncomfortable. Ext:  SCDs BLE.  No significant edema.  No cyanosis Skin: No petechiae / purpurea.  No major sores Musculoskeletal: No severe joint pain.  Good ROM major joints   Results:   Labs: Results for orders placed during the hospital encounter of 05/02/12 (from the past 48 hour(s))  CBC WITH DIFFERENTIAL     Status: Abnormal   Collection Time    05/02/12  4:04 PM      Result Value Range   WBC 13.3 (*) 4.0 - 10.5 K/uL   RBC 5.07   3.87 - 5.11 MIL/uL   Hemoglobin 13.7  12.0 - 15.0 g/dL   HCT 16.1  09.6 - 04.5 %   MCV 80.3  78.0 - 100.0 fL   MCH 27.0  26.0 - 34.0 pg   MCHC 33.7  30.0 - 36.0 g/dL   RDW 40.9  81.1 - 91.4 %   Platelets 276  150 - 400 K/uL   Neutrophils Relative 87 (*) 43 - 77 %   Neutro Abs 11.7 (*) 1.7 - 7.7 K/uL   Lymphocytes Relative 8 (*) 12 - 46 %   Lymphs Abs 1.0  0.7 - 4.0 K/uL   Monocytes Relative 5  3 - 12 %   Monocytes Absolute 0.7  0.1 -  1.0 K/uL   Eosinophils Relative 0  0 - 5 %   Eosinophils Absolute 0.0  0.0 - 0.7 K/uL   Basophils Relative 0  0 - 1 %   Basophils Absolute 0.0  0.0 - 0.1 K/uL  COMPREHENSIVE METABOLIC PANEL     Status: Abnormal   Collection Time    05/02/12  4:04 PM      Result Value Range   Sodium 134 (*) 135 - 145 mEq/L   Potassium 3.7  3.5 - 5.1 mEq/L   Chloride 96  96 - 112 mEq/L   CO2 27  19 - 32 mEq/L   Glucose, Bld 210 (*) 70 - 99 mg/dL   BUN 22  6 - 23 mg/dL   Creatinine, Ser 6.38  0.50 - 1.10 mg/dL   Calcium 9.4  8.4 - 93.7 mg/dL   Total Protein 6.9  6.0 - 8.3 g/dL   Albumin 3.3 (*) 3.5 - 5.2 g/dL   AST 17  0 - 37 U/L   ALT 14  0 - 35 U/L   Alkaline Phosphatase 83  39 - 117 U/L   Total Bilirubin 1.0  0.3 - 1.2 mg/dL   GFR calc non Af Amer >90  >90 mL/min   GFR calc Af Amer >90  >90 mL/min   Comment:            The eGFR has been calculated     using the CKD EPI equation.     This calculation has not been     validated in all clinical     situations.     eGFR's persistently     <90 mL/min signify     possible Chronic Kidney Disease.  LIPASE, BLOOD     Status: Abnormal   Collection Time    05/02/12  4:04 PM      Result Value Range   Lipase 10 (*) 11 - 59 U/L  LACTIC ACID, PLASMA     Status: None   Collection Time    05/02/12  4:35 PM      Result Value Range   Lactic Acid, Venous 1.7  0.5 - 2.2 mmol/L  URINALYSIS, ROUTINE W REFLEX MICROSCOPIC     Status: Abnormal   Collection Time    05/02/12  8:09 PM      Result Value Range   Color,  Urine YELLOW  YELLOW   APPearance CLEAR  CLEAR   Specific Gravity, Urine >1.046 (*) 1.005 - 1.030   pH 5.5  5.0 - 8.0   Glucose, UA NEGATIVE  NEGATIVE mg/dL   Hgb urine dipstick NEGATIVE  NEGATIVE   Bilirubin Urine NEGATIVE  NEGATIVE   Ketones, ur NEGATIVE  NEGATIVE mg/dL   Protein, ur NEGATIVE  NEGATIVE mg/dL   Urobilinogen, UA 0.2  0.0 - 1.0 mg/dL   Nitrite NEGATIVE  NEGATIVE   Leukocytes, UA NEGATIVE  NEGATIVE   Comment: MICROSCOPIC NOT DONE ON URINES WITH NEGATIVE PROTEIN, BLOOD, LEUKOCYTES, NITRITE, OR GLUCOSE <1000 mg/dL.    Imaging / Studies: Ct Abdomen Pelvis W Contrast  05/02/2012  *RADIOLOGY REPORT*  Clinical Data: Abdominal pain and vomiting for 1 day.  CT ABDOMEN AND PELVIS WITH CONTRAST  Technique:  Multidetector CT imaging of the abdomen and pelvis was performed following the standard protocol during bolus administration of intravenous contrast.  Contrast: OMNIPAQUE IOHEXOL 300 MG/ML  SOLN  Comparison: CT abdomen pelvis 04/25/2010.  Findings: The lung bases are clear.  No pleural or pericardial effusion.  Cardiomegaly  is noted.  The liver, gallbladder, adrenal glands, spleen, pancreas and kidneys appear normal.  As seen on comparison study, the patient has a midline ventral hernia.  Hernia opening measures 4.6 cm transverse by 3.7 cm cranial-caudal.  New since the prior exam, a knuckle of transverse colon is contained within the hernia as well as omental fat.  There is gas and stool in the colon distal to the hernia although distal colon appears relatively decompressed compared to the ascending and proximal transverse colon which is markedly distended with a large volume of stool present.  No pneumatosis, free air, portal venous gas or fluid collection is identified. Sigmoid diverticulosis without diverticulitis is noted.  Stomach and small bowel appear normal.  The appendix is not visualized and may have been removed.  The urinary bladder is unremarkable.  No lymphadenopathy is  identified. No focal bony abnormality is seen with multilevel thoracic and lumbar spondylosis noted.  IMPRESSION:  1.  Midline ventral hernia contains a knuckle of transverse colon which is new since the prior CT.  The ascending and proximal transverse colon are distended with a large volume of stool present. The colon distal to the hernia is relatively decompressed although there is some gas and stool present.  Findings suggest partial or early colonic obstruction due to the hernia. 2.  Diverticulosis without diverticulitis.   Original Report Authenticated By: Holley Dexter, M.D.     Medications / Allergies: per chart  Antibiotics: Anti-infectives   None      Assessment  [name] Bridges  72 y.o. female       Problem List:  Principal Problem:   Umbilical hernia, incarcerated Active Problems:   Severe obesity (BMI >= 40)   Type II or unspecified type diabetes mellitus with unspecified complication, uncontrolled   Cellulitis   Nausea and vomiting   Abdominal  pain, other specified site   Cellulitis of left lower extremity   Hypertension   SBO (small bowel obstruction) due to incarcerated umbilical hernia   Chronic umbilical hernia nonincarcerated with colon with pain nausea vomiting and CT scan concerning for obstruction and possible colon ischemia/necrosis.  Plan:  This patient requires emergent surgery.  We will try and see if I can do diagnostic laparoscopy to get a good view & avoid a giant incision.  If I can reduce the colon and it looks healthy and viable there is no evidence of infection, proceed with ventral hernia repair with mesh.  I am skeptical that will be possible.  Probably will need a primary repair with possible biologic underlay/reinforcement to minimize recurrence even though as not nearly as good as permanent mesh repair.    If the colon is necrotic, then proceed with partial colectomy.  Will least try primary repair of hernia.  I warned her given the  emergent setting and her superobesity, she is at high risk for recurrence.  However, the other option is to allow the colon to fully diet and draped in her life.  I discuss procedure with she and her husband:  The anatomy & physiology of the digestive tract was discussed.  The pathophysiology of perforation was discussed.  Differential diagnosis such as perforated ulcer or colon, etc was discussed.   Natural history risks without surgery such as death was discussed.  I recommended abdominal exploration to diagnose & treat the source of the problem.  Laparoscopic & open techniques were discussed.   Risks such as bleeding, infection, abscess, leak, reoperation, bowel resection, possible ostomy, hernia, heart  attack, death, and other risks were discussed.   The risks of no intervention will lead to serious problems including death.   I expressed a good likelihood that surgery will address the problem.    Goals of post-operative recovery were discussed as well.  We will work to minimize complications although risks in an emergent setting are high.   Questions were answered.  The patient expressed understanding & wishes to proceed with surgery.      -replace NGT in OR -IVF IV Abx -DM control -VTE prophylaxis- SCDs, etc -mobilize as tolerated to help recovery    Ardeth Sportsman, M.D., F.A.C.S. Gastrointestinal and Minimally Invasive Surgery [facility] [address], [address] [place], [place] [address] [phone] Main / Paging [phone] Voice Mail   05/02/2012

## 2012-05-02 NOTE — ED Notes (Signed)
Pt here for c/o abd pain and emesis x1 day pt  Stated that pain has increased

## 2012-05-03 ENCOUNTER — Encounter (HOSPITAL_COMMUNITY): Payer: Self-pay | Admitting: Surgery

## 2012-05-03 DIAGNOSIS — K56609 Unspecified intestinal obstruction, unspecified as to partial versus complete obstruction: Secondary | ICD-10-CM | POA: Diagnosis present

## 2012-05-03 LAB — BASIC METABOLIC PANEL
CO2: 29 mEq/L (ref 19–32)
Calcium: 8.8 mg/dL (ref 8.4–10.5)
Creatinine, Ser: 0.8 mg/dL (ref 0.50–1.10)
GFR calc Af Amer: 85 mL/min — ABNORMAL LOW (ref >90)
GFR calc non Af Amer: 73 mL/min — ABNORMAL LOW (ref >90)

## 2012-05-03 LAB — GLUCOSE, CAPILLARY
Glucose-Capillary: 228 mg/dL — ABNORMAL HIGH (ref 70–99)
Glucose-Capillary: 142 mg/dL — ABNORMAL HIGH (ref 70–99)
Glucose-Capillary: 114 mg/dL — ABNORMAL HIGH (ref 70–99)
Glucose-Capillary: 127 mg/dL — ABNORMAL HIGH (ref 70–99)

## 2012-05-03 LAB — CBC
MCV: 82.5 fL (ref 78.0–100.0)
Platelets: 221 10*3/uL (ref 150–400)
RDW: 14.9 % (ref 11.5–15.5)
WBC: 19.6 10*3/uL — ABNORMAL HIGH (ref 4.0–10.5)

## 2012-05-03 MED ORDER — CEFAZOLIN SODIUM-DEXTROSE 2-3 GM-% IV SOLR
2.0000 g | Freq: Three times a day (TID) | INTRAVENOUS | Status: AC
  Administered 2012-05-03 – 2012-05-06 (×9): 2 g via INTRAVENOUS
  Filled 2012-05-03 (×9): qty 50

## 2012-05-03 MED ORDER — DEXTROSE 5 % IV SOLN: 3.0000 g | Freq: Three times a day (TID) | INTRAVENOUS | Status: DC

## 2012-05-03 MED ORDER — KETOROLAC TROMETHAMINE 30 MG/ML IJ SOLN
INTRAMUSCULAR | Status: DC | PRN
  Administered 2012-05-03: 30 mg via INTRAVENOUS

## 2012-05-03 MED ORDER — KETOROLAC TROMETHAMINE 30 MG/ML IJ SOLN: 30.0000 mg | Freq: Four times a day (QID) | INTRAMUSCULAR | Status: DC

## 2012-05-03 MED ORDER — METRONIDAZOLE IN NACL 5-0.79 MG/ML-% IV SOLN
500.0000 mg | Freq: Three times a day (TID) | INTRAVENOUS | Status: AC
  Administered 2012-05-03 – 2012-05-06 (×9): 500 mg via INTRAVENOUS
  Filled 2012-05-03 (×9): qty 100

## 2012-05-03 MED ORDER — LABETALOL HCL 5 MG/ML IV SOLN: 2.0000 mg | INTRAVENOUS | Status: DC | PRN

## 2012-05-03 MED ORDER — LACTATED RINGERS IV SOLN: INTRAVENOUS | Status: DC

## 2012-05-03 MED ORDER — MEPERIDINE HCL 50 MG/ML IJ SOLN: 6.2500 mg | INTRAMUSCULAR | Status: DC | PRN

## 2012-05-03 MED ORDER — HYDROMORPHONE HCL PF 1 MG/ML IJ SOLN
INTRAMUSCULAR | Status: DC | PRN
  Administered 2012-05-03: 0.5 mg via INTRAVENOUS

## 2012-05-03 MED ORDER — GLYCOPYRROLATE 0.2 MG/ML IJ SOLN
INTRAMUSCULAR | Status: DC | PRN
  Administered 2012-05-03: .7 mg via INTRAVENOUS

## 2012-05-03 MED ORDER — ONDANSETRON HCL 4 MG/2ML IJ SOLN
INTRAMUSCULAR | Status: DC | PRN
  Administered 2012-05-03: 4 mg via INTRAVENOUS

## 2012-05-03 MED ORDER — KETOROLAC TROMETHAMINE 15 MG/ML IJ SOLN
30.0000 mg | Freq: Four times a day (QID) | INTRAMUSCULAR | Status: DC
  Filled 2012-05-03 (×5): qty 2

## 2012-05-03 MED ORDER — METRONIDAZOLE IVPB CUSTOM
1.0000 g | Freq: Three times a day (TID) | INTRAVENOUS | Status: DC
Start: 2012-05-03 — End: 2012-05-03
  Filled 2012-05-03: qty 200

## 2012-05-03 MED ORDER — INSULIN GLARGINE 100 UNIT/ML ~~LOC~~ SOLN
20.0000 [IU] | Freq: Every day | SUBCUTANEOUS | Status: DC
  Administered 2012-05-04 – 2012-05-07 (×4): 20 [IU] via SUBCUTANEOUS

## 2012-05-03 MED ORDER — KETOROLAC TROMETHAMINE 30 MG/ML IJ SOLN
30.0000 mg | Freq: Four times a day (QID) | INTRAMUSCULAR | Status: AC
  Administered 2012-05-03 – 2012-05-05 (×8): 30 mg via INTRAVENOUS
  Filled 2012-05-03 (×8): qty 1

## 2012-05-03 MED ORDER — STERILE WATER FOR IRRIGATION IR SOLN
Status: DC | PRN
  Administered 2012-05-03: 1500 mL

## 2012-05-03 MED ORDER — LABETALOL HCL 5 MG/ML IV SOLN
INTRAVENOUS | Status: DC | PRN
  Administered 2012-05-03: 5 mg via INTRAVENOUS

## 2012-05-03 MED ORDER — HYDROMORPHONE HCL PF 1 MG/ML IJ SOLN: 0.2500 mg | INTRAMUSCULAR | Status: DC | PRN

## 2012-05-03 MED ORDER — NEOSTIGMINE METHYLSULFATE 1 MG/ML IJ SOLN
INTRAMUSCULAR | Status: DC | PRN
  Administered 2012-05-03: 4 mg via INTRAVENOUS

## 2012-05-03 MED ORDER — PROMETHAZINE HCL 25 MG/ML IJ SOLN: 6.2500 mg | INTRAMUSCULAR | Status: DC | PRN

## 2012-05-03 MED ORDER — LACTATED RINGERS IV SOLN
INTRAVENOUS | Status: DC | PRN
  Administered 2012-05-02: 23:00:00 via INTRAVENOUS

## 2012-05-03 MED ORDER — MIDAZOLAM HCL 2 MG/2ML IJ SOLN: 0.5000 mg | Freq: Once | INTRAMUSCULAR | Status: DC | PRN

## 2012-05-03 NOTE — Care Management Note (Signed)
    Page 1 of 1   05/03/2012     10:58:24 AM   CARE MANAGEMENT NOTE 05/03/2012  Patient:  [name] Bridges, [name] Bridges   Account Number:  [id number]  Date Initiated:  05/03/2012  Documentation initiated by:  Lorenda Ishihara  Subjective/Objective Assessment:   73 yo female admitted s/p hernia repair. PTA lived at home with spouse.     Action/Plan:   Home when stable   Anticipated DC Date:  05/06/2012   Anticipated DC Plan:  HOME/SELF CARE      DC Planning Services  CM consult      Choice offered to / List presented to:             Status of service:  Completed, signed off Medicare Important Message given?  NA - LOS <3 / Initial given by admissions (If response is "NO", the following Medicare IM given date fields will be blank) Date Medicare IM given:   Date Additional Medicare IM given:    Discharge Disposition:  HOME/SELF CARE  Per UR Regulation:  Reviewed for med. necessity/level of care/duration of stay  If discussed at Long Length of Stay Meetings, dates discussed:    Comments:

## 2012-05-03 NOTE — Progress Notes (Signed)
[facility] PROGRESS NOTE  [name] Bridges:[id number] DOB: 24-Feb-1941 DOA: 05/02/2012 PCP: Herb Grays, MD  Assessment/Plan: Colon obstruction secondary to incarcerated ventral hernia -Status post laparoscopic mesh repair and hernia reduction -Appreciate surgery followup -Diet advancement per surgical service -Pain control Leukocytosis -Likely reactive due to acute illness and recent surgery -Patient is afebrile and hemodynamically stable -Will trend closely -Repeat CT abdomen if continues to increase -Urinalysis does not suggest UTI -Lactic acid 1.7 at the time of admission Diabetes mellitus type 2 -Check hemoglobin A1c -Will only give Lantus 20 units in the morning do to the patient's diminished oral intake and surgical limitations -NovoLog sliding scale Hypertension -Hold antihypertensives at this time as the patient remains normotensive Morbid obesity     Family Communication:   Pt at beside Disposition Plan:   Home when medically stable   Procedures:  Laparoscopic ventral hernia repair with mesh and hernia reduction--05/03/2012    Procedures/Studies: Ct Abdomen Pelvis W Contrast  05/02/2012  *RADIOLOGY REPORT*  Clinical Data: Abdominal pain and vomiting for 1 day.  CT ABDOMEN AND PELVIS WITH CONTRAST  Technique:  Multidetector CT imaging of the abdomen and pelvis was performed following the standard protocol during bolus administration of intravenous contrast.  Contrast: OMNIPAQUE IOHEXOL 300 MG/ML  SOLN  Comparison: CT abdomen pelvis 04/25/2010.  Findings: The lung bases are clear.  No pleural or pericardial effusion.  Cardiomegaly is noted.  The liver, gallbladder, adrenal glands, spleen, pancreas and kidneys appear normal.  As seen on comparison study, the patient has a midline ventral hernia.  Hernia opening measures 4.6 cm transverse by 3.7 cm cranial-caudal.  New since the prior exam, a knuckle of transverse colon is contained within the hernia as  well as omental fat.  There is gas and stool in the colon distal to the hernia although distal colon appears relatively decompressed compared to the ascending and proximal transverse colon which is markedly distended with a large volume of stool present.  No pneumatosis, free air, portal venous gas or fluid collection is identified. Sigmoid diverticulosis without diverticulitis is noted.  Stomach and small bowel appear normal.  The appendix is not visualized and may have been removed.  The urinary bladder is unremarkable.  No lymphadenopathy is identified. No focal bony abnormality is seen with multilevel thoracic and lumbar spondylosis noted.  IMPRESSION:  1.  Midline ventral hernia contains a knuckle of transverse colon which is new since the prior CT.  The ascending and proximal transverse colon are distended with a large volume of stool present. The colon distal to the hernia is relatively decompressed although there is some gas and stool present.  Findings suggest partial or early colonic obstruction due to the hernia. 2.  Diverticulosis without diverticulitis.   Original Report Authenticated By: Holley Dexter, M.D.          Subjective: Patient states the pain is controlled. She denies fevers, chills, chest pain, shortness of breath, nausea, vomiting, diarrhea, abdominal pain, dysuria. She does have abdominal pain when she moves. She is passing flatus but no bowel movements.  Objective: Filed Vitals:   05/03/12 0526 05/03/12 0610 05/03/12 0706 05/03/12 0810  BP: 131/49 121/47 100/40 111/44  Pulse: 71 68 72 66  Temp: 98.7 F (37.1 C) 98.5 F (36.9 C) 97.7 F (36.5 C) 99.5 F (37.5 C)  TempSrc:  Oral Oral Oral  Resp: 20 18 16 16   Height:      Weight:      SpO2: 92% 97% 99%  98%    Intake/Output Summary (Last 24 hours) at 05/03/12 1435 Last data filed at 05/03/12 1014  Gross per 24 hour  Intake   2640 ml  Output    650 ml  Net   1990 ml   Weight change:  Exam:   General:   Pt is alert, follows commands appropriately, not in acute distress  HEENT: No icterus, No thrush, [place]iner/AT; NG in place  Cardiovascular: RRR, S1/S2, no rubs, no gallops  Respiratory: CTA bilaterally, no wheezing, no crackles, no rhonchi  Abdomen: Soft/+BS,non distended, no guarding  Extremities: 1+ edema, No lymphangitis, No petechiae, No rashes, no synovitis  Data Reviewed: Basic Metabolic Panel:  Recent Labs Lab 05/02/12 1604 05/03/12 0309  NA 134* 137  K 3.7 3.7  CL 96 100  CO2 27 29  GLUCOSE 210* 222*  BUN 22 21  CREATININE 0.58 0.80  CALCIUM 9.4 8.8   Liver Function Tests:  Recent Labs Lab 05/02/12 1604  AST 17  ALT 14  ALKPHOS 83  BILITOT 1.0  PROT 6.9  ALBUMIN 3.3*    Recent Labs Lab 05/02/12 1604  LIPASE 10*   No results found for this basename: AMMONIA,  in the last 168 hours CBC:  Recent Labs Lab 05/02/12 1604 05/03/12 0309  WBC 13.3* 19.6*  NEUTROABS 11.7*  --   HGB 13.7 12.3  HCT 40.7 38.3  MCV 80.3 82.5  PLT 276 221   Cardiac Enzymes: No results found for this basename: CKTOTAL, CKMB, CKMBINDEX, TROPONINI,  in the last 168 hours BNP: No components found with this basename: POCBNP,  CBG:  Recent Labs Lab 05/02/12 2100 05/03/12 0329 05/03/12 0541 05/03/12 0750 05/03/12 1208  GLUCAP 180* 228* 199* 170* 142*    Recent Results (from the past 240 hour(s))  MRSA PCR SCREENING     Status: None   Collection Time    05/02/12  9:51 PM      Result Value Range Status   MRSA by PCR NEGATIVE  NEGATIVE Final   Comment:            The GeneXpert MRSA Assay (FDA     approved for NASAL specimens     only), is one component of a     comprehensive MRSA colonization     surveillance program. It is not     intended to diagnose MRSA     infection nor to guide or     monitor treatment for     MRSA infections.     Scheduled Meds: .  ceFAZolin (ANCEF) IV  2 g Intravenous Q8H  . insulin aspart  0-15 Units Subcutaneous Q6H  . insulin  glargine  20 Units Subcutaneous BID  . ketorolac  30 mg Intravenous Q6H  . metronidazole  500 mg Intravenous Q8H   Continuous Infusions: . sodium chloride 150 mL/hr at 05/03/12 0628     TAT, DAVID, DO  [facility] Pager [phone]  If 7PM-7AM, please contact night-coverage [email or web address] Password [email or web address] 05/03/2012, 2:35 PM   LOS: 1 day

## 2012-05-03 NOTE — Op Note (Signed)
05/02/2012 - 05/03/2012  1:35 AM  PATIENT:  [name] Bridges  72 y.o. female  Patient Care Team: Herb Grays, MD as PCP - General (Family Medicine) Ernestene Mention, MD as Consulting Physician (General Surgery)  PRE-OPERATIVE DIAGNOSIS:  Colon obstruction due to incarcerated Ventral Hernia   POST-OPERATIVE DIAGNOSIS:  Colon obstruction due to incarcerated ventral hernia  PROCEDURE:  Procedure(s): LAPAROSCOPIC VENTRAL HERNIA Reduction & Repair with mesh Umbilectomy  SURGEON:  Surgeon(s): Ardeth Sportsman, MD  ASSISTANT: RN   ANESTHESIA:   local and general  EBL:  Total I/O In: -  Out: 200 [Urine:200]  Delay start of Pharmacological VTE agent (>24hrs) due to surgical blood loss or risk of bleeding:  no  DRAINS: none   SPECIMEN:  Source of Specimen:  Umbilicus with hernia sac  DISPOSITION OF SPECIMEN:  PATHOLOGY  COUNTS:  YES  PLAN OF CARE: Admit to inpatient   PATIENT DISPOSITION:  PACU - hemodynamically stable.  INDICATION: Super morbidly obese female with chronic umbilical ventral hernia.  Recent cellulitis improved on antibiotics.  Awaiting elective repair.  He felt to be repeated nausea vomiting.  CT scan concerning for transverse colon incarcerated with a hernia now.  Hernia not reducible.  Patient with severe pain and nausea.  I made a recommendation for operative exploration:  The anatomy & physiology of the digestive tract was discussed.  The pathophysiology of perforation was discussed.  Differential diagnosis such as perforated ulcer or colon, etc was discussed.   Natural history risks without surgery such as death was discussed.  I recommended abdominal exploration to diagnose & treat the source of the problem.  Laparoscopic & open techniques were discussed.   Risks such as bleeding, infection, abscess, leak, reoperation, bowel resection, possible ostomy, hernia, heart attack, death, and other risks were discussed.   The risks of no intervention will lead to  serious problems including death.   I expressed a good likelihood that surgery will address the problem.    Goals of post-operative recovery were discussed as well.  We will work to minimize complications although risks in an emergent setting are high.   Questions were answered.  The patient expressed understanding & wishes to proceed with surgery.      OR FINDINGS: Large volume of omentum incarcerated in umbilical ventral hernia.  Transverse colon viable.  4 x 3 cm defect.  Type of repair - lap underlay with primary repair  (choices - primary suture, mesh, or component)  Name of mesh - Physiomesh  Size of mesh - Length 20 cm, Width 15 cm  Mesh overlap - 5-8 cm  Placement of mesh - beneath fascia and into peritoneal cavity   (choices - beneath fascia and into peritoneal cavity, beneath fascia but external to peritoneal cavity, between the muscle and fascia, above or external to fascia)  DESCRIPTION:   Informed consent was confirmed. The patient underwent general anaesthesia without difficulty. The patient was positioned appropriately. VTE prevention in place. The patient's abdomen was clipped, prepped, & draped in a sterile fashion. Surgical timeout confirmed our plan.   The patient was positioned in reverse Trendelenburg. Abdominal entry was gained using optical entry technique in the right upper abdomen. Entry was clean. I induced carbon dioxide insufflation. Camera inspection revealed no injury. Extra ports were carefully placed under direct laparoscopic visualization.   I could see the hernia in the central abdomen.  Large swath of greater omentum was incarcerated within it.  Transverse colon was going up into it.  I was able to easily milked out the transverse colon which was artery partially reduced.  I was able to get the greater omentum mostly down as well.    I did laparoscopic lysis of adhesions to expose the entire anterior abdominal wall.  I primarily used and focused cold  scissors.    I made sure hemostasis was good.  I mapped out the region using a needle passer.   To ensure that I would have at least 5 cm radial coverage outside of the hernia defect, I chose a 20x15 cm dual sided mesh.  I placed #1 Prolene stitches around its edge about every 5 cm = 16 total.  I rolled the mesh & placed into the peritoneal cavity through the 10 cm fascial defect.  I unrolled  the mesh and positioned it appropriately.  I secured the mesh to cover up the hernia defect using a laparoscopic suture passer to pass the tails of the Prolene through the abdominal wall & tagged them with clamps.  I started out in four corners to make sure I had the mesh centered over the hernia defect appropriately, and then proceeded to work in quadrants.  We evacuated CO2 & desufflated the abdomen.  I tied the fascial stitches down.  I went and excised the bellybutton transversely using an elliptical incision.  Also excised most the hernia sac.  It looked viable.  No pus or purulence.  No necrosis.  I primarily repaired the umbilical ventral hernia transversely using #1 Prolene interrupted stitches.  I reinsufflated the abdomen.  The mesh provided at least 5-10 cm circumferential coverage around the entire region of hernia defects.   I tacked the edges & central part of the mesh with SecureStrap absorbable tacks.   Hemostasis was excellent.  I closed the fascia port site on the 10 mm port using a 0 Vicryl stitch using laparoscopic intracorporeal suture passer.   I did reinspection. Hemostasis was good. Mesh laid well. Capnoperitoneum was evacuated. Ports were removed. The skin was closed with Monocryl at the port sites and Steri-Strips on the fascial stitch puncture sites.  Patient is being extubated to go to the recovery room. I'm about discussed operative findings with the patient's family.

## 2012-05-03 NOTE — Progress Notes (Signed)
Patient ID: [name] Bridges, female   DOB: December 14, 1940, 72 y.o.   MRN: [id number] 1 Day Post-Op  Subjective: Pt reports feeling ok, pain not bad at all, denies n/v, feels hungry, NGT is very annoying  Objective: Vital signs in last 24 hours: Temp:  [97.7 F (36.5 C)-99.5 F (37.5 C)] 99.5 F (37.5 C) (03/10 0810) Pulse Rate:  [61-85] 66 (03/10 0810) Resp:  [11-20] 16 (03/10 0810) BP: (100-187)/(40-100) 111/44 mmHg (03/10 0810) SpO2:  [9 %-100 %] 98 % (03/10 0810) Weight:  [330 lb 0.5 oz (149.7 kg)] 330 lb 0.5 oz (149.7 kg) (03/09 2102) Last BM Date: 05/01/12  Intake/Output from previous day: 03/09 0701 - 03/10 0700 In: 2075 [I.V.:2075] Out: 450 [Urine:450] Intake/Output this shift:    PE: Abd: obese, soft, incisions c/d/i, some pain with palp but no peritonitis, +BS General: awake, alert, NAD  Lab Results:   Recent Labs  05/02/12 1604 05/03/12 0309  WBC 13.3* 19.6*  HGB 13.7 12.3  HCT 40.7 38.3  PLT 276 221   BMET  Recent Labs  05/02/12 1604 05/03/12 0309  NA 134* 137  K 3.7 3.7  CL 96 100  CO2 27 29  GLUCOSE 210* 222*  BUN 22 21  CREATININE 0.58 0.80  CALCIUM 9.4 8.8   PT/INR No results found for this basename: LABPROT, INR,  in the last 72 hours CMP     Component Value Date/Time   NA 137 05/03/2012 0309   K 3.7 05/03/2012 0309   CL 100 05/03/2012 0309   CO2 29 05/03/2012 0309   GLUCOSE 222* 05/03/2012 0309   BUN 21 05/03/2012 0309   CREATININE 0.80 05/03/2012 0309   CALCIUM 8.8 05/03/2012 0309   PROT 6.9 05/02/2012 1604   ALBUMIN 3.3* 05/02/2012 1604   AST 17 05/02/2012 1604   ALT 14 05/02/2012 1604   ALKPHOS 83 05/02/2012 1604   BILITOT 1.0 05/02/2012 1604   GFRNONAA 73* 05/03/2012 0309   GFRAA 85* 05/03/2012 0309   Lipase     Component Value Date/Time   LIPASE 10* 05/02/2012 1604       Studies/Results: Ct Abdomen Pelvis W Contrast  05/02/2012  *RADIOLOGY REPORT*  Clinical Data: Abdominal pain and vomiting for 1 day.  CT ABDOMEN AND PELVIS WITH CONTRAST   Technique:  Multidetector CT imaging of the abdomen and pelvis was performed following the standard protocol during bolus administration of intravenous contrast.  Contrast: OMNIPAQUE IOHEXOL 300 MG/ML  SOLN  Comparison: CT abdomen pelvis 04/25/2010.  Findings: The lung bases are clear.  No pleural or pericardial effusion.  Cardiomegaly is noted.  The liver, gallbladder, adrenal glands, spleen, pancreas and kidneys appear normal.  As seen on comparison study, the patient has a midline ventral hernia.  Hernia opening measures 4.6 cm transverse by 3.7 cm cranial-caudal.  New since the prior exam, a knuckle of transverse colon is contained within the hernia as well as omental fat.  There is gas and stool in the colon distal to the hernia although distal colon appears relatively decompressed compared to the ascending and proximal transverse colon which is markedly distended with a large volume of stool present.  No pneumatosis, free air, portal venous gas or fluid collection is identified. Sigmoid diverticulosis without diverticulitis is noted.  Stomach and small bowel appear normal.  The appendix is not visualized and may have been removed.  The urinary bladder is unremarkable.  No lymphadenopathy is identified. No focal bony abnormality is seen with multilevel thoracic and  lumbar spondylosis noted.  IMPRESSION:  1.  Midline ventral hernia contains a knuckle of transverse colon which is new since the prior CT.  The ascending and proximal transverse colon are distended with a large volume of stool present. The colon distal to the hernia is relatively decompressed although there is some gas and stool present.  Findings suggest partial or early colonic obstruction due to the hernia. 2.  Diverticulosis without diverticulitis.   Original Report Authenticated By: Holley Dexter, M.D.     Anti-infectives: Anti-infectives   Start     Dose/Rate Route Frequency Ordered Stop   05/03/12 1000  ceFAZolin (ANCEF) IVPB 2  g/50 mL premix     2 g 100 mL/hr over 30 Minutes Intravenous Every 8 hours 05/03/12 0804 05/06/12 0959   05/03/12 0900  metroNIDAZOLE (FLAGYL) IVPB 1 g  Status:  Discontinued    Comments:  Pharmacy may adjust dosing strength, interval, or rate of medication as needed for optimal therapy for the patient Send with patient on call to the OR.  Anesthesia to complete antibiotic administration <58min prior to incision per [facility].   1 g 200 mL/hr over 60 Minutes Intravenous Every 8 hours 05/03/12 0740 05/03/12 0812   05/03/12 0900  metroNIDAZOLE (FLAGYL) IVPB 500 mg    Comments:  Pharmacy may adjust dosing strength, interval, or rate of medication as needed for optimal therapy for the patient Send with patient on call to the OR.  Anesthesia to complete antibiotic administration <61min prior to incision per [facility].   500 mg 100 mL/hr over 60 Minutes Intravenous Every 8 hours 05/03/12 0812 05/06/12 0859   05/03/12 0745  ceFAZolin (ANCEF) 3 g in dextrose 5 % 50 mL IVPB  Status:  Discontinued     3 g 160 mL/hr over 30 Minutes Intravenous 3 times per day 05/03/12 0740 05/03/12 0802   05/03/12 0600  [MAR Hold]  metroNIDAZOLE (FLAGYL) IVPB 500 mg     (On MAR Hold since 05/02/12 2235)  Comments:  Pharmacy may adjust dosing strength, interval, or rate of medication as needed for optimal therapy for the patient Send with patient on call to the OR.  Anesthesia to complete antibiotic administration <76min prior to incision per [facility].   500 mg 100 mL/hr over 60 Minutes Intravenous On call to O.R. 05/02/12 2215 05/02/12 2358   05/03/12 0000  Ampicillin-Sulbactam (UNASYN) 3 g in sodium chloride 0.9 % 100 mL IVPB  Status:  Discontinued     3 g 100 mL/hr over 60 Minutes Intravenous 4 times per day 05/02/12 2138 05/02/12 2215   05/02/12 2200  ceFAZolin (ANCEF) 3 g in dextrose 5 % 50 mL IVPB     3 g 160 mL/hr over 30 Minutes Intravenous 60 min pre-op 05/02/12 2115 05/02/12 2330   05/02/12 2130   Ampicillin-Sulbactam (UNASYN) 3 g in sodium chloride 0.9 % 100 mL IVPB  Status:  Discontinued     3 g 100 mL/hr over 60 Minutes Intravenous 4 times per day 05/02/12 2122 05/02/12 2138   05/02/12 2101  ceFAZolin (ANCEF) IVPB 2 g/50 mL premix  Status:  Discontinued     2 g 100 mL/hr over 30 Minutes Intravenous 60 min pre-op 05/02/12 2101 05/02/12 2114       Assessment/Plan 1. POD#1-Lap ventral hernia reduction and repair with mesh: doing well, min pain, no nausea/vomiting, WBC is 19.2 today which is concerning but not likely due to surgical infection (too soon) but given incarcerated hernia will need to  keep close eye on this, abdominal exam does not support bowel ischemia so will watch today and recheck CBC in AM, will try to clamp NGT today and allow ice chips but will not allow diet yet, Dr. Carolynne Edouard will see later today  --clamp ngt  --recheck CBC in am  --OOB as tolerated   LOS: 1 day    WHITE, ELIZABETH 05/03/2012

## 2012-05-03 NOTE — Anesthesia Postprocedure Evaluation (Signed)
  Anesthesia Post-op Note  Patient: [name] Bridges  Procedure(s) Performed: Procedure(s) with comments: LAPAROSCOPIC VENTRAL HERNIA Repair with mesh (N/A) - Lysis of adhesions,   Umbillectomy  Patient Location: PACU  Anesthesia Type:General  Level of Consciousness: awake, alert  and oriented  Airway and Oxygen Therapy: Patient Spontanous Breathing and Patient connected to nasal cannula oxygen  Post-op Pain: none  Post-op Assessment: Post-op Vital signs reviewed, Patient's Cardiovascular Status Stable, Respiratory Function Stable, Patent Airway, No signs of Nausea or vomiting, Adequate PO intake and Pain level controlled  Post-op Vital Signs: Reviewed and stable  Complications: No apparent anesthesia complications

## 2012-05-03 NOTE — Transfer of Care (Signed)
Immediate Anesthesia Transfer of Care Note  Patient: [name] Bridges  Procedure(s) Performed: Procedure(s) (LRB): LAPAROSCOPIC VENTRAL HERNIA Repair with mesh (N/A)  Patient Location: PACU  Anesthesia Type: General  Level of Consciousness: sedated, patient cooperative and responds to stimulaton  Airway & Oxygen Therapy: Patient Spontanous Breathing and Patient connected to face mask oxgen  Post-op Assessment: Report given to PACU RN and Post -op Vital signs reviewed and stable  Post vital signs: Reviewed and stable  Complications: No apparent anesthesia complications

## 2012-05-04 LAB — HEMOGLOBIN A1C: Hgb A1c MFr Bld: 7.9 % — ABNORMAL HIGH (ref <5.7)

## 2012-05-04 LAB — CBC WITH DIFFERENTIAL/PLATELET
Basophils Relative: 0 % (ref 0–1)
Eosinophils Absolute: 0.5 10*3/uL (ref 0.0–0.7)
Lymphs Abs: 1.5 10*3/uL (ref 0.7–4.0)
MCH: 26.9 pg (ref 26.0–34.0)
Neutrophils Relative %: 68 % (ref 43–77)
Platelets: 165 10*3/uL (ref 150–400)
RBC: 3.94 MIL/uL (ref 3.87–5.11)

## 2012-05-04 LAB — MAGNESIUM: Magnesium: 2.3 mg/dL (ref 1.5–2.5)

## 2012-05-04 LAB — BASIC METABOLIC PANEL
GFR calc Af Amer: >90 mL/min (ref >90)
GFR calc non Af Amer: 84 mL/min — ABNORMAL LOW (ref >90)
Potassium: 3.9 mEq/L (ref 3.5–5.1)
Sodium: 139 mEq/L (ref 135–145)

## 2012-05-04 LAB — GLUCOSE, CAPILLARY: Glucose-Capillary: 124 mg/dL — ABNORMAL HIGH (ref 70–99)

## 2012-05-04 NOTE — Progress Notes (Signed)
Received order for rolling walker.  Equipment has been delivered to the hospital room.

## 2012-05-04 NOTE — Progress Notes (Signed)
Patient ID: [name] Bridges, female   DOB: [birth year], 72 y.o.   MRN: [id number] 2 Days Post-Op  Subjective: Pt reports feeling good, no pain except when up moving around, denies n/v, feels hungry, NGT is very annoying, +flatus but no BM  Objective: Vital signs in last 24 hours: Temp:  [97.5 F (36.4 C)-99.5 F (37.5 C)] 98.2 F (36.8 C) (03/11 0540) Pulse Rate:  [65-75] 75 (03/11 0540) Resp:  [16-18] 16 (03/11 0540) BP: (111-120)/(44-65) 119/61 mmHg (03/11 0540) SpO2:  [97 %-99 %] 98 % (03/11 0540) Last BM Date: 05/01/12  Intake/Output from previous day: 03/10 0701 - 03/11 0700 In: 4005 [I.V.:3555; IV Piggyback:450] Out: 800 [Urine:800] Intake/Output this shift:    PE: Abd: obese, soft, incisions c/d/i, some pain with palp but no peritonitis, +BS General: awake, alert, NAD  Lab Results:   Recent Labs  05/03/12 0309 05/04/12 0405  WBC 19.6* 9.6  HGB 12.3 10.6*  HCT 38.3 32.4*  PLT 221 165   BMET  Recent Labs  05/03/12 0309 05/04/12 0405  NA 137 139  K 3.7 3.9  CL 100 104  CO2 29 28  GLUCOSE 222* 122*  BUN 21 21  CREATININE 0.80 0.75  CALCIUM 8.8 8.2*   PT/INR No results found for this basename: LABPROT, INR,  in the last 72 hours CMP     Component Value Date/Time   NA 139 05/04/2012 0405   K 3.9 05/04/2012 0405   CL 104 05/04/2012 0405   CO2 28 05/04/2012 0405   GLUCOSE 122* 05/04/2012 0405   BUN 21 05/04/2012 0405   CREATININE 0.75 05/04/2012 0405   CALCIUM 8.2* 05/04/2012 0405   PROT 6.9 05/02/2012 1604   ALBUMIN 3.3* 05/02/2012 1604   AST 17 05/02/2012 1604   ALT 14 05/02/2012 1604   ALKPHOS 83 05/02/2012 1604   BILITOT 1.0 05/02/2012 1604   GFRNONAA 84* 05/04/2012 0405   GFRAA >90 05/04/2012 0405   Lipase     Component Value Date/Time   LIPASE 10* 05/02/2012 1604       Studies/Results: Ct Abdomen Pelvis W Contrast  05/02/2012  *RADIOLOGY REPORT*  Clinical Data: Abdominal pain and vomiting for 1 day.  CT ABDOMEN AND PELVIS WITH CONTRAST  Technique:   Multidetector CT imaging of the abdomen and pelvis was performed following the standard protocol during bolus administration of intravenous contrast.  Contrast: OMNIPAQUE IOHEXOL 300 MG/ML  SOLN  Comparison: CT abdomen pelvis 04/25/2010.  Findings: The lung bases are clear.  No pleural or pericardial effusion.  Cardiomegaly is noted.  The liver, gallbladder, adrenal glands, spleen, pancreas and kidneys appear normal.  As seen on comparison study, the patient has a midline ventral hernia.  Hernia opening measures 4.6 cm transverse by 3.7 cm cranial-caudal.  New since the prior exam, a knuckle of transverse colon is contained within the hernia as well as omental fat.  There is gas and stool in the colon distal to the hernia although distal colon appears relatively decompressed compared to the ascending and proximal transverse colon which is markedly distended with a large volume of stool present.  No pneumatosis, free air, portal venous gas or fluid collection is identified. Sigmoid diverticulosis without diverticulitis is noted.  Stomach and small bowel appear normal.  The appendix is not visualized and may have been removed.  The urinary bladder is unremarkable.  No lymphadenopathy is identified. No focal bony abnormality is seen with multilevel thoracic and lumbar spondylosis noted.  IMPRESSION:  1.  Midline ventral hernia contains a knuckle of transverse colon which is new since the prior CT.  The ascending and proximal transverse colon are distended with a large volume of stool present. The colon distal to the hernia is relatively decompressed although there is some gas and stool present.  Findings suggest partial or early colonic obstruction due to the hernia. 2.  Diverticulosis without diverticulitis.   Original Report Authenticated By: Holley Dexter, M.D.     Anti-infectives: Anti-infectives   Start     Dose/Rate Route Frequency Ordered Stop   05/03/12 1000  ceFAZolin (ANCEF) IVPB 2 g/50 mL  premix     2 g 100 mL/hr over 30 Minutes Intravenous Every 8 hours 05/03/12 0804 05/06/12 0959   05/03/12 0900  metroNIDAZOLE (FLAGYL) IVPB 1 g  Status:  Discontinued    Comments:  Pharmacy may adjust dosing strength, interval, or rate of medication as needed for optimal therapy for the patient Send with patient on call to the OR.  Anesthesia to complete antibiotic administration <72min prior to incision per [facility].   1 g 200 mL/hr over 60 Minutes Intravenous Every 8 hours 05/03/12 0740 05/03/12 0812   05/03/12 0900  metroNIDAZOLE (FLAGYL) IVPB 500 mg    Comments:  Pharmacy may adjust dosing strength, interval, or rate of medication as needed for optimal therapy for the patient Send with patient on call to the OR.  Anesthesia to complete antibiotic administration <29min prior to incision per [facility].   500 mg 100 mL/hr over 60 Minutes Intravenous Every 8 hours 05/03/12 0812 05/06/12 0859   05/03/12 0745  ceFAZolin (ANCEF) 3 g in dextrose 5 % 50 mL IVPB  Status:  Discontinued     3 g 160 mL/hr over 30 Minutes Intravenous 3 times per day 05/03/12 0740 05/03/12 0802   05/03/12 0600  [MAR Hold]  metroNIDAZOLE (FLAGYL) IVPB 500 mg     (On MAR Hold since 05/02/12 2235)  Comments:  Pharmacy may adjust dosing strength, interval, or rate of medication as needed for optimal therapy for the patient Send with patient on call to the OR.  Anesthesia to complete antibiotic administration <26min prior to incision per [facility].   500 mg 100 mL/hr over 60 Minutes Intravenous On call to O.R. 05/02/12 2215 05/02/12 2358   05/03/12 0000  Ampicillin-Sulbactam (UNASYN) 3 g in sodium chloride 0.9 % 100 mL IVPB  Status:  Discontinued     3 g 100 mL/hr over 60 Minutes Intravenous 4 times per day 05/02/12 2138 05/02/12 2215   05/02/12 2200  ceFAZolin (ANCEF) 3 g in dextrose 5 % 50 mL IVPB     3 g 160 mL/hr over 30 Minutes Intravenous 60 min pre-op 05/02/12 2115 05/02/12 2330   05/02/12 2130   Ampicillin-Sulbactam (UNASYN) 3 g in sodium chloride 0.9 % 100 mL IVPB  Status:  Discontinued     3 g 100 mL/hr over 60 Minutes Intravenous 4 times per day 05/02/12 2122 05/02/12 2138   05/02/12 2101  ceFAZolin (ANCEF) IVPB 2 g/50 mL premix  Status:  Discontinued     2 g 100 mL/hr over 30 Minutes Intravenous 60 min pre-op 05/02/12 2101 05/02/12 2114       Assessment/Plan 1. POD#2-Lap ventral hernia reduction and repair with mesh: doing well, will d/c ngt today and start clears, WBC normalized today, advance diet to fulls later today if tolerating clears  --D/C ngt  --clear liquids this am, poss fulls this afternoon  --OOB as tolerated  LOS: 2 days    WHITE, ELIZABETH 05/04/2012

## 2012-05-04 NOTE — Progress Notes (Signed)
Patient bladder scanned since no void since foley removed at 0545,114cc showed,patient complaining of discomfort burning and pressure but unable to void. I&O cath done with 250cc clear yellow urine returned. Patient tolerated well. Will continue to monitor. CLum Keas

## 2012-05-04 NOTE — Progress Notes (Addendum)
[facility] PROGRESS NOTE  [name] Bridges ZOX:[id number] DOB: 30-Apr-1940 DOA: 05/02/2012 PCP: Herb Grays, MD  Brief history 72 year old female with a history of known umbilical hernia presented with one-day history of severe nausea and intractable vomiting and abdominal pain. CT scan of the abdomen and pelvis in the emergency department showed a midline ventral hernia with a knuckle of transverse colon incarcerated with signs of partial colonic obstruction. WBC was 13.3 and lactic acid was 1.7. Urinalysis was unremarkable. Surgery was consulted the emergency department and the patient was taken emergently to surgery in the very early morning of 05/03/2012. The patient underwent reduction of her ventral hernia with laparoscopic mesh repair and umbilectomy. Postoperatively, the patient has struggled with pain which has limited her physical therapy. In the first 24 hours postop, the patient had an NG tube to suction. On 05/04/2012, the NG tube was discontinued and the patient was started on a clear liquid diet.  Assessment/Plan: Colon obstruction secondary to incarcerated ventral hernia  -Status post emergent laparoscopic mesh repair and hernia reduction (05/03/12) -Appreciate surgery followup-->NG removed 05/04/12 -Diet advancement per surgical service-->clears on 05/04/12 -Pain control  Leukocytosis  -Likely reactive due to acute illness and recent surgery  -resolved 24 hrs after surgery -Patient is afebrile and hemodynamically stable  -Repeat CT abdomen if continues to increase  -Urinalysis does not suggest UTI  -Lactic acid 1.7 at the time of admission  Diabetes mellitus type 2  -Check hemoglobin A1c-7.9  -Will only give Lantus 20 units in the morning do to the patient's diminished oral intake and surgical limitations  -CBGs controlled (110-130) on above Lantus dose -adjust as diet is advanced -NovoLog sliding scale  Hypertension  -Hold antihypertensives at this time as the patient  remains normotensive  -restart as pt BP trends up -Patient was dominant on valsartan 160 mg Urine retention -Foley catheter removed a.m. 05/04/2012 -in and out cath only with 250cc afternoon 3/11---> continue IVF and monitor Dizziness and arm dysesthesias -Cycle troponins -EKG obtained--> sinus rhythm, no ST-T wave changes Morbid obesity      Family Communication:   sister at beside Disposition Plan:   Home when medically stable    Antibiotics: Metronidazole 05/03/2012>>> (surgery is managing)   Procedures/Studies: Ct Abdomen Pelvis W Contrast  05/02/2012  *RADIOLOGY REPORT*  Clinical Data: Abdominal pain and vomiting for 1 day.  CT ABDOMEN AND PELVIS WITH CONTRAST  Technique:  Multidetector CT imaging of the abdomen and pelvis was performed following the standard protocol during bolus administration of intravenous contrast.  Contrast: OMNIPAQUE IOHEXOL 300 MG/ML  SOLN  Comparison: CT abdomen pelvis 04/25/2010.  Findings: The lung bases are clear.  No pleural or pericardial effusion.  Cardiomegaly is noted.  The liver, gallbladder, adrenal glands, spleen, pancreas and kidneys appear normal.  As seen on comparison study, the patient has a midline ventral hernia.  Hernia opening measures 4.6 cm transverse by 3.7 cm cranial-caudal.  New since the prior exam, a knuckle of transverse colon is contained within the hernia as well as omental fat.  There is gas and stool in the colon distal to the hernia although distal colon appears relatively decompressed compared to the ascending and proximal transverse colon which is markedly distended with a large volume of stool present.  No pneumatosis, free air, portal venous gas or fluid collection is identified. Sigmoid diverticulosis without diverticulitis is noted.  Stomach and small bowel appear normal.  The appendix is not visualized and may have been removed.  The urinary  bladder is unremarkable.  No lymphadenopathy is identified. No focal bony  abnormality is seen with multilevel thoracic and lumbar spondylosis noted.  IMPRESSION:  1.  Midline ventral hernia contains a knuckle of transverse colon which is new since the prior CT.  The ascending and proximal transverse colon are distended with a large volume of stool present. The colon distal to the hernia is relatively decompressed although there is some gas and stool present.  Findings suggest partial or early colonic obstruction due to the hernia. 2.  Diverticulosis without diverticulitis.   Original Report Authenticated By: Holley Dexter, M.D.          Subjective: Patient states that she had pain which limited her physical therapy today. Otherwise she denies any fevers, chills, chest pain, shortness breath, nausea, vomiting, diarrhea, abdominal pain, dysuria, hematuria.  Objective: Filed Vitals:   05/03/12 2156 05/04/12 0201 05/04/12 0540 05/04/12 1300  BP: 114/65 118/54 119/61 183/78  Pulse: 75 65 75 87  Temp: 97.5 F (36.4 C) 98.2 F (36.8 C) 98.2 F (36.8 C) 97.8 F (36.6 C)  TempSrc: Oral Oral Oral Oral  Resp: 16 16 16 18   Height:      Weight:      SpO2: 97% 99% 98% 90%    Intake/Output Summary (Last 24 hours) at 05/04/12 1753 Last data filed at 05/04/12 1500  Gross per 24 hour  Intake 9177.08 ml  Output    750 ml  Net 8427.08 ml   Weight change:  Exam:   General:  Pt is alert, follows commands appropriately, not in acute distress  HEENT: No icterus, No thrush,  [place]/AT  Cardiovascular: RRR, S1/S2, no rubs, no gallops  Respiratory: CTA bilaterally, no wheezing, no crackles, no rhonchi  Abdomen: Soft/+BS, tender diffusely without any peritoneal signs, non distended, no guarding  Extremities: trace edema, No lymphangitis, No petechiae, No rashes, no synovitis  Data Reviewed: Basic Metabolic Panel:  Recent Labs Lab 05/02/12 1604 05/03/12 0309 05/04/12 0405  NA 134* 137 139  K 3.7 3.7 3.9  CL 96 100 104  CO2 27 29 28   GLUCOSE 210* 222* 122*   BUN 22 21 21   CREATININE 0.58 0.80 0.75  CALCIUM 9.4 8.8 8.2*  MG  --   --  2.3   Liver Function Tests:  Recent Labs Lab 05/02/12 1604  AST 17  ALT 14  ALKPHOS 83  BILITOT 1.0  PROT 6.9  ALBUMIN 3.3*    Recent Labs Lab 05/02/12 1604  LIPASE 10*   No results found for this basename: AMMONIA,  in the last 168 hours CBC:  Recent Labs Lab 05/02/12 1604 05/03/12 0309 05/04/12 0405  WBC 13.3* 19.6* 9.6  NEUTROABS 11.7*  --  6.6  HGB 13.7 12.3 10.6*  HCT 40.7 38.3 32.4*  MCV 80.3 82.5 82.2  PLT 276 221 165   Cardiac Enzymes:  Recent Labs Lab 05/04/12 1230  TROPONINI <0.30   BNP: No components found with this basename: POCBNP,  CBG:  Recent Labs Lab 05/03/12 2151 05/04/12 0539 05/04/12 0752 05/04/12 1154 05/04/12 1718  GLUCAP 127* 120* 127* 113* 124*    Recent Results (from the past 240 hour(s))  MRSA PCR SCREENING     Status: None   Collection Time    05/02/12  9:51 PM      Result Value Range Status   MRSA by PCR NEGATIVE  NEGATIVE Final   Comment:            The GeneXpert MRSA Assay (  FDA     approved for NASAL specimens     only), is one component of a     comprehensive MRSA colonization     surveillance program. It is not     intended to diagnose MRSA     infection nor to guide or     monitor treatment for     MRSA infections.     Scheduled Meds: .  ceFAZolin (ANCEF) IV  2 g Intravenous Q8H  . insulin aspart  0-15 Units Subcutaneous Q6H  . insulin glargine  20 Units Subcutaneous Daily  . ketorolac  30 mg Intravenous Q6H  . metronidazole  500 mg Intravenous Q8H   Continuous Infusions: . sodium chloride 150 mL/hr at 05/04/12 1440     Camaryn Lumbert, DO  [facility] Pager [phone]  If 7PM-7AM, please contact night-coverage [email or web address] Password [facility] 05/04/2012, 5:53 PM   LOS: 2 days

## 2012-05-04 NOTE — Progress Notes (Signed)
PT Cancellation Note  Patient Details Name: [name] Bridges MRN: [id number] DOB: 1940-11-18   Cancelled Treatment:    Reason Eval/Treat Not Completed: Pain limiting ability to participate (has ambulated  x 2 already.)   Rada Hay 05/04/2012, 4:23 PM

## 2012-05-05 LAB — BASIC METABOLIC PANEL
BUN: 17 mg/dL (ref 6–23)
Chloride: 102 mEq/L (ref 96–112)
Creatinine, Ser: 0.67 mg/dL (ref 0.50–1.10)
GFR calc Af Amer: >90 mL/min (ref >90)
Glucose, Bld: 141 mg/dL — ABNORMAL HIGH (ref 70–99)

## 2012-05-05 LAB — GLUCOSE, CAPILLARY
Glucose-Capillary: 120 mg/dL — ABNORMAL HIGH (ref 70–99)
Glucose-Capillary: 105 mg/dL — ABNORMAL HIGH (ref 70–99)
Glucose-Capillary: 127 mg/dL — ABNORMAL HIGH (ref 70–99)

## 2012-05-05 MED ORDER — HYDRALAZINE HCL 10 MG PO TABS
10.0000 mg | ORAL_TABLET | Freq: Four times a day (QID) | ORAL | Status: DC | PRN
  Filled 2012-05-05: qty 1

## 2012-05-05 MED ORDER — TORSEMIDE 20 MG PO TABS
20.0000 mg | ORAL_TABLET | Freq: Every day | ORAL | Status: DC
  Administered 2012-05-05 – 2012-05-06 (×2): 20 mg via ORAL
  Filled 2012-05-05 (×3): qty 1

## 2012-05-05 MED ORDER — ACETAMINOPHEN 325 MG PO TABS: 650.0000 mg | ORAL_TABLET | Freq: Four times a day (QID) | ORAL | Status: DC | PRN

## 2012-05-05 NOTE — Progress Notes (Signed)
[facility] PROGRESS NOTE  [name] Bridges BMW:[id number] DOB: 12/11/1940 DOA: 05/02/2012 PCP: Herb Grays, MD  Brief narrative: 72 year old female with a history of known umbilical hernia presented with one-day history of severe nausea and intractable vomiting and abdominal pain. CT scan of the abdomen and pelvis in the emergency department showed a midline ventral hernia with a knuckle of transverse colon incarcerated with signs of partial colonic obstruction. WBC was 13.3 and lactic acid was 1.7. Surgery consult was called from the ED and was emergently taken for surgery.. The patient underwent reduction of her ventral hernia with laparoscopic mesh repair and umbilectomy. Postoperatively,  patient slowly improving and now on full liquid diet.    Assessment/Plan:  Colon obstruction secondary to incarcerated ventral hernia  -Status post emergent laparoscopic mesh repair and hernia reduction (05/03/12)  -Surgery closely following. NG removed on 3/11 now advanced to full liquid diet. -Pain control  -Leukocytosis on admission likely reactive and now resolved   Diabetes mellitus type 2  - hemoglobin A1c-7.9  -CBG will controlled on current Lantus dose. -Continue sliding scale insulin.  Hypertension  -Blood pressure fairly stable. On when necessary hydralazine.. Patient is on valsartan at home  Leg edema Patient has pitting edema bilaterally. She was on Demadex as outpatient which is held. I will resume it  Dizziness and arm dysesthesias  EKG and serial troponins negative. Now resolved  Morbid obesity   Family Communication: Family at bedside. Disposition Plan: Home once diet advanced and tolerated well  Antibiotics:  Metronidazole 05/03/2012>>>       HPI/Subjective: Denies any pain at rest. No nausea or vomiting. Has not had a bowel movement yet  Objective: Filed Vitals:   05/04/12 1300 05/04/12 2149 05/05/12 0539 05/05/12 1400  BP: 183/78 147/58 122/55 137/52   Pulse: 87 88 66 83  Temp: 97.8 F (36.6 C) 98.8 F (37.1 C) 98.6 F (37 C) 98.7 F (37.1 C)  TempSrc: Oral Oral Oral Oral  Resp: 18 18 16 18   Height:      Weight:      SpO2: 90% 92% 95% 97%    Intake/Output Summary (Last 24 hours) at 05/05/12 1550 Last data filed at 05/05/12 1400  Gross per 24 hour  Intake   4445 ml  Output   2350 ml  Net   2095 ml   Filed Weights   05/02/12 2102  Weight: 149.7 kg (330 lb 0.5 oz)    Exam:   General:  On review of his female lying in bed in no acute distress  HEENT: No pallor, moist oral mucosa  Cardiovascular: S1 and S2, no murmurs rub or gallop  Respiratory: Ear to auscultation bilaterally, no added sounds  Abdomen: Morbidly obese, soft, tenderness over surgical site, nondistended, dressing stain with fluid from low incision.  Musculoskeletal: Warm 1+ pitting edema bilaterally   CNS: AAO x3  Data Reviewed: Basic Metabolic Panel:  Recent Labs Lab 05/02/12 1604 05/03/12 0309 05/04/12 0405 05/05/12 0009  NA 134* 137 139 135  K 3.7 3.7 3.9 3.3*  CL 96 100 104 102  CO2 27 29 28 26   GLUCOSE 210* 222* 122* 141*  BUN 22 21 21 17   CREATININE 0.58 0.80 0.75 0.67  CALCIUM 9.4 8.8 8.2* 8.1*  MG  --   --  2.3  --    Liver Function Tests:  Recent Labs Lab 05/02/12 1604  AST 17  ALT 14  ALKPHOS 83  BILITOT 1.0  PROT 6.9  ALBUMIN 3.3*  Recent Labs Lab 05/02/12 1604  LIPASE 10*   No results found for this basename: AMMONIA,  in the last 168 hours CBC:  Recent Labs Lab 05/02/12 1604 05/03/12 0309 05/04/12 0405  WBC 13.3* 19.6* 9.6  NEUTROABS 11.7*  --  6.6  HGB 13.7 12.3 10.6*  HCT 40.7 38.3 32.4*  MCV 80.3 82.5 82.2  PLT 276 221 165   Cardiac Enzymes:  Recent Labs Lab 05/04/12 1230 05/04/12 1842 05/05/12 0009  TROPONINI <0.30 <0.30 <0.30   BNP (last 3 results) No results found for this basename: PROBNP,  in the last 8760 hours CBG:  Recent Labs Lab 05/04/12 1718 05/04/12 2314  05/05/12 0537 05/05/12 0726 05/05/12 1204  GLUCAP 124* 156* 136* 123* 120*    Recent Results (from the past 240 hour(s))  MRSA PCR SCREENING     Status: None   Collection Time    05/02/12  9:51 PM      Result Value Range Status   MRSA by PCR NEGATIVE  NEGATIVE Final   Comment:            The GeneXpert MRSA Assay (FDA     approved for NASAL specimens     only), is one component of a     comprehensive MRSA colonization     surveillance program. It is not     intended to diagnose MRSA     infection nor to guide or     monitor treatment for     MRSA infections.     Studies: No results found.  Scheduled Meds: .  ceFAZolin (ANCEF) IV  2 g Intravenous Q8H  . insulin aspart  0-15 Units Subcutaneous Q6H  . insulin glargine  20 Units Subcutaneous Daily  . metronidazole  500 mg Intravenous Q8H   Continuous Infusions: . sodium chloride 150 mL/hr at 05/05/12 7829      Time spent: 25 minutes    Xander Jutras  [facility] Pager [phone] If 7PM-7AM, please contact night-coverage at [email or web address], password [facility] 05/05/2012, 3:50 PM  LOS: 3 days

## 2012-05-05 NOTE — Progress Notes (Signed)
OT Screen Note  Patient Details Name: ILLONA BULMAN MRN: [id number] DOB: [birth year]   Cancelled Treatment:     Pt screened for OT.  She feels like she will be fine with bathroom transfers and adls at home with husbands help.    SPENCER,MARYELLEN 05/05/2012, 2:21 PM Marica Otter, OTR/L [phone] 05/05/2012

## 2012-05-05 NOTE — Progress Notes (Signed)
Patient ID: [name] Bridges, female   DOB: 1940-12-29, 72 y.o.   MRN: [id number] 3 Days Post-Op  Subjective: Pt reports feeling good, no pain except when up moving around, denies n/v, feels hungry, +flatus but no BM  Objective: Vital signs in last 24 hours: Temp:  [97.8 F (36.6 C)-98.8 F (37.1 C)] 98.6 F (37 C) (03/12 0539) Pulse Rate:  [66-88] 66 (03/12 0539) Resp:  [16-18] 16 (03/12 0539) BP: (122-183)/(55-78) 122/55 mmHg (03/12 0539) SpO2:  [90 %-95 %] 95 % (03/12 0539) Last BM Date: 05/01/12  Intake/Output from previous day: 03/11 0701 - 03/12 0700 In: 9124.6 [P.O.:360; I.V.:8564.6; IV Piggyback:200] Out: 1300 [Urine:1300] Intake/Output this shift:    PE: Abd: obese, soft, incisions c/d/i, some pain with palp but no peritonitis, +BS, there serous fluid draining from the inferior incisions, there is pitting edema present, no significant erythema General: awake, alert, NAD  Lab Results:   Recent Labs  05/03/12 0309 05/04/12 0405  WBC 19.6* 9.6  HGB 12.3 10.6*  HCT 38.3 32.4*  PLT 221 165   BMET  Recent Labs  05/04/12 0405 05/05/12 0009  NA 139 135  K 3.9 3.3*  CL 104 102  CO2 28 26  GLUCOSE 122* 141*  BUN 21 17  CREATININE 0.75 0.67  CALCIUM 8.2* 8.1*   PT/INR No results found for this basename: LABPROT, INR,  in the last 72 hours CMP     Component Value Date/Time   NA 135 05/05/2012 0009   K 3.3* 05/05/2012 0009   CL 102 05/05/2012 0009   CO2 26 05/05/2012 0009   GLUCOSE 141* 05/05/2012 0009   BUN 17 05/05/2012 0009   CREATININE 0.67 05/05/2012 0009   CALCIUM 8.1* 05/05/2012 0009   PROT 6.9 05/02/2012 1604   ALBUMIN 3.3* 05/02/2012 1604   AST 17 05/02/2012 1604   ALT 14 05/02/2012 1604   ALKPHOS 83 05/02/2012 1604   BILITOT 1.0 05/02/2012 1604   GFRNONAA 87* 05/05/2012 0009   GFRAA >90 05/05/2012 0009   Lipase     Component Value Date/Time   LIPASE 10* 05/02/2012 1604       Studies/Results: No results found.  Anti-infectives: Anti-infectives   Start     Dose/Rate Route Frequency Ordered Stop   05/03/12 1000  ceFAZolin (ANCEF) IVPB 2 g/50 mL premix     2 g 100 mL/hr over 30 Minutes Intravenous Every 8 hours 05/03/12 0804 05/06/12 0959   05/03/12 0900  metroNIDAZOLE (FLAGYL) IVPB 1 g  Status:  Discontinued    Comments:  Pharmacy may adjust dosing strength, interval, or rate of medication as needed for optimal therapy for the patient Send with patient on call to the OR.  Anesthesia to complete antibiotic administration <7min prior to incision per [facility].   1 g 200 mL/hr over 60 Minutes Intravenous Every 8 hours 05/03/12 0740 05/03/12 0812   05/03/12 0900  metroNIDAZOLE (FLAGYL) IVPB 500 mg    Comments:  Pharmacy may adjust dosing strength, interval, or rate of medication as needed for optimal therapy for the patient Send with patient on call to the OR.  Anesthesia to complete antibiotic administration <91min prior to incision per [facility].   500 mg 100 mL/hr over 60 Minutes Intravenous Every 8 hours 05/03/12 0812 05/06/12 0859   05/03/12 0745  ceFAZolin (ANCEF) 3 g in dextrose 5 % 50 mL IVPB  Status:  Discontinued     3 g 160 mL/hr over 30 Minutes Intravenous 3 times per day  05/03/12 0740 05/03/12 0802   05/03/12 0600  [MAR Hold]  metroNIDAZOLE (FLAGYL) IVPB 500 mg     (On MAR Hold since 05/02/12 2235)  Comments:  Pharmacy may adjust dosing strength, interval, or rate of medication as needed for optimal therapy for the patient Send with patient on call to the OR.  Anesthesia to complete antibiotic administration <36min prior to incision per [facility].   500 mg 100 mL/hr over 60 Minutes Intravenous On call to O.R. 05/02/12 2215 05/02/12 2358   05/03/12 0000  Ampicillin-Sulbactam (UNASYN) 3 g in sodium chloride 0.9 % 100 mL IVPB  Status:  Discontinued     3 g 100 mL/hr over 60 Minutes Intravenous 4 times per day 05/02/12 2138 05/02/12 2215   05/02/12 2200  ceFAZolin (ANCEF) 3 g in dextrose 5 % 50 mL IVPB     3 g 160  mL/hr over 30 Minutes Intravenous 60 min pre-op 05/02/12 2115 05/02/12 2330   05/02/12 2130  Ampicillin-Sulbactam (UNASYN) 3 g in sodium chloride 0.9 % 100 mL IVPB  Status:  Discontinued     3 g 100 mL/hr over 60 Minutes Intravenous 4 times per day 05/02/12 2122 05/02/12 2138   05/02/12 2101  ceFAZolin (ANCEF) IVPB 2 g/50 mL premix  Status:  Discontinued     2 g 100 mL/hr over 30 Minutes Intravenous 60 min pre-op 05/02/12 2101 05/02/12 2114       Assessment/Plan 1. POD#3-Lap ventral hernia reduction and repair with mesh: doing well, will advance to full liquids, will need to watch inferior incisions closely, re-enforce area with guaze/abd, will advance to soft diet a dinner if no problems   --full liquids, ?soft at dinner  --dressing changes to inferior incisions  --OOB as tolerated   LOS: 3 days    WHITE, ELIZABETH 05/05/2012

## 2012-05-05 NOTE — Progress Notes (Signed)
Physical Therapy Discharge Patient Details Name: [name] Bridges MRN: [id number] DOB: [birth year] Today's Date: 05/05/2012 Time: 1033-     Patient discharged from PT services secondary to   Pt is up ad lib with family. Has RW for home.  Progress and discharge plan discussed with patient and/or caregiver:   GP     Rada Hay [phone] 05/05/2012, 10:34 AM

## 2012-05-06 LAB — GLUCOSE, CAPILLARY
Glucose-Capillary: 136 mg/dL — ABNORMAL HIGH (ref 70–99)
Glucose-Capillary: 156 mg/dL — ABNORMAL HIGH (ref 70–99)
Glucose-Capillary: 159 mg/dL — ABNORMAL HIGH (ref 70–99)

## 2012-05-06 MED ORDER — POLYETHYLENE GLYCOL 3350 17 G PO PACK
17.0000 g | PACK | Freq: Every day | ORAL | Status: DC
  Administered 2012-05-06: 17 g via ORAL
  Filled 2012-05-06 (×2): qty 1

## 2012-05-06 MED ORDER — BISACODYL 10 MG RE SUPP
10.0000 mg | Freq: Every day | RECTAL | Status: DC | PRN
Start: 2012-05-06 — End: 2012-05-07
  Administered 2012-05-06: 10 mg via RECTAL
  Filled 2012-05-06: qty 1

## 2012-05-06 NOTE — Progress Notes (Addendum)
[facility] PROGRESS NOTE  KENYON ESHLEMAN GNF:[id number] DOB: October 29, 1940 DOA: 05/02/2012 PCP: Herb Grays, MD    Brief narrative:  72 year old female with a history of known umbilical hernia presented with one-day history of severe nausea and intractable vomiting and abdominal pain. CT scan of the abdomen and pelvis in the emergency department showed a midline ventral hernia with a knuckle of transverse colon incarcerated with signs of partial colonic obstruction. WBC was 13.3 and lactic acid was 1.7. Surgery consult was called from the ED and was emergently taken for surgery.. The patient underwent reduction of her ventral hernia with laparoscopic mesh repair and umbilectomy. Postoperatively, patient slowly improving and now tolerating regular diet.  Assessment/Plan:  Colon obstruction secondary to incarcerated ventral hernia  -Status post emergent laparoscopic mesh repair and hernia reduction (05/03/12)  -Surgery closely following. Advanced to regular diet and now tolerating -Continue Pain control  -Leukocytosis on admission likely reactive and now resolved   Diabetes mellitus type 2  - hemoglobin A1c-7.9  -CBG will controlled on current Lantus dose.  -Continue sliding scale insulin.   Hypertension  -Blood pressure fairly stable. On when necessary hydralazine.. Patient is on valsartan at home resume.  Leg edema  Patient has pitting edema bilaterally. She was on Demadex as outpatient which was held. I have resumed it  Dizziness and arm dysesthesias  EKG and serial troponins negative. Now resolved   Morbid obesity   Family Communication: Husband at bedside.  Disposition Plan: Likely tomorrow    Antibiotics Metronidazole 05/03/2012>>     HPI/Subjective: Patient seen and examined. Informs her abdominal pain to be slightly better and present when she ambulates for long time.  Objective: Filed Vitals:   05/05/12 0539 05/05/12 1400 05/05/12 2127 05/06/12 0658  BP:  122/55 137/52 140/60 145/67  Pulse: 66 83 79 80  Temp: 98.6 F (37 C) 98.7 F (37.1 C) 98.1 F (36.7 C) 98.3 F (36.8 C)  TempSrc: Oral Oral Oral Oral  Resp: 16 18 18 18   Height:      Weight:      SpO2: 95% 97% 95% 95%    Intake/Output Summary (Last 24 hours) at 05/06/12 1157 Last data filed at 05/06/12 1008  Gross per 24 hour  Intake   3743 ml  Output   7600 ml  Net  -3857 ml   Filed Weights   05/02/12 2102  Weight: 149.7 kg (330 lb 0.5 oz)    Exam:  General: Morbidly obese female  in no acute distress  HEENT: No pallor, moist oral mucosa  Cardiovascular: S1 and S2, no murmurs rub or gallop  Respiratory: Clear to auscultation bilaterally, no added sounds  Abdomen: Morbidly obese, soft, minimal tenderness over surgical site, nondistended, dressing stain with serous fluid from low incision. (Improved from yesterday) Musculoskeletal: Warm 1+ pitting edema bilaterally  CNS: AAO x3   Data Reviewed: Basic Metabolic Panel:  Recent Labs Lab 05/02/12 1604 05/03/12 0309 05/04/12 0405 05/05/12 0009  NA 134* 137 139 135  K 3.7 3.7 3.9 3.3*  CL 96 100 104 102  CO2 27 29 28 26   GLUCOSE 210* 222* 122* 141*  BUN 22 21 21 17   CREATININE 0.58 0.80 0.75 0.67  CALCIUM 9.4 8.8 8.2* 8.1*  MG  --   --  2.3  --    Liver Function Tests:  Recent Labs Lab 05/02/12 1604  AST 17  ALT 14  ALKPHOS 83  BILITOT 1.0  PROT 6.9  ALBUMIN 3.3*    Recent  Labs Lab 05/02/12 1604  LIPASE 10*   No results found for this basename: AMMONIA,  in the last 168 hours CBC:  Recent Labs Lab 05/02/12 1604 05/03/12 0309 05/04/12 0405  WBC 13.3* 19.6* 9.6  NEUTROABS 11.7*  --  6.6  HGB 13.7 12.3 10.6*  HCT 40.7 38.3 32.4*  MCV 80.3 82.5 82.2  PLT 276 221 165   Cardiac Enzymes:  Recent Labs Lab 05/04/12 1230 05/04/12 1842 05/05/12 0009  TROPONINI <0.30 <0.30 <0.30   BNP (last 3 results) No results found for this basename: PROBNP,  in the last 8760 hours CBG:  Recent  Labs Lab 05/05/12 1708 05/05/12 2330 05/06/12 0159 05/06/12 0559 05/06/12 1148  GLUCAP 105* 127* 131* 136* 156*    Recent Results (from the past 240 hour(s))  MRSA PCR SCREENING     Status: None   Collection Time    05/02/12  9:51 PM      Result Value Range Status   MRSA by PCR NEGATIVE  NEGATIVE Final   Comment:            The GeneXpert MRSA Assay (FDA     approved for NASAL specimens     only), is one component of a     comprehensive MRSA colonization     surveillance program. It is not     intended to diagnose MRSA     infection nor to guide or     monitor treatment for     MRSA infections.     Studies: No results found.  Scheduled Meds: . insulin aspart  0-15 Units Subcutaneous Q6H  . insulin glargine  20 Units Subcutaneous Daily  . polyethylene glycol  17 g Oral Daily  . torsemide  20 mg Oral Daily   Continuous Infusions: . sodium chloride 50 mL/hr at 05/06/12 1013     Time spent: 25 minutes   DHUNGEL, NISHANT  [facility] Pager [phone]. If 7PM-7AM, please contact night-coverage at [email or web address], password [facility] 05/06/2012, 11:57 AM  LOS: 4 days

## 2012-05-06 NOTE — Progress Notes (Signed)
Patient requested mirlax contacted dr Dwain Sarna stated they would be in soon to determine what is best for patient situation will advise patient. Auriel RN

## 2012-05-06 NOTE — Progress Notes (Signed)
Patient ID: [name] Bridges, female   DOB: 1940/06/27, 72 y.o.   MRN: [id number] 4 Days Post-Op  Subjective: Pt reports a rough night because she feels like she has a very hard stool getting ready to come out but wont, denies n/v, tolerating diet well, walking well, Objective: Vital signs in last 24 hours: Temp:  [98.1 F (36.7 C)-98.7 F (37.1 C)] 98.3 F (36.8 C) (03/13 0658) Pulse Rate:  [79-83] 80 (03/13 0658) Resp:  [18] 18 (03/13 0658) BP: (137-145)/(52-67) 145/67 mmHg (03/13 0658) SpO2:  [95 %-97 %] 95 % (03/13 0658) Last BM Date: 05/01/12  Intake/Output from previous day: 03/12 0701 - 03/13 0700 In: 4793 [P.O.:823; I.V.:3570; IV Piggyback:400] Out: 8400 [Urine:8400] Intake/Output this shift:    PE: Abd: obese, soft, incisions c/d/i, some pain with palp but no peritonitis, +BS, there serous fluid draining from the inferior incisions but less today, there is pitting edema present, no significant erythema General: awake, alert, NAD  Lab Results:   Recent Labs  05/04/12 0405  WBC 9.6  HGB 10.6*  HCT 32.4*  PLT 165   BMET  Recent Labs  05/04/12 0405 05/05/12 0009  NA 139 135  K 3.9 3.3*  CL 104 102  CO2 28 26  GLUCOSE 122* 141*  BUN 21 17  CREATININE 0.75 0.67  CALCIUM 8.2* 8.1*   PT/INR No results found for this basename: LABPROT, INR,  in the last 72 hours CMP     Component Value Date/Time   NA 135 05/05/2012 0009   K 3.3* 05/05/2012 0009   CL 102 05/05/2012 0009   CO2 26 05/05/2012 0009   GLUCOSE 141* 05/05/2012 0009   BUN 17 05/05/2012 0009   CREATININE 0.67 05/05/2012 0009   CALCIUM 8.1* 05/05/2012 0009   PROT 6.9 05/02/2012 1604   ALBUMIN 3.3* 05/02/2012 1604   AST 17 05/02/2012 1604   ALT 14 05/02/2012 1604   ALKPHOS 83 05/02/2012 1604   BILITOT 1.0 05/02/2012 1604   GFRNONAA 87* 05/05/2012 0009   GFRAA >90 05/05/2012 0009   Lipase     Component Value Date/Time   LIPASE 10* 05/02/2012 1604       Studies/Results: No results  found.  Anti-infectives: Anti-infectives   Start     Dose/Rate Route Frequency Ordered Stop   05/03/12 1000  ceFAZolin (ANCEF) IVPB 2 g/50 mL premix     2 g 100 mL/hr over 30 Minutes Intravenous Every 8 hours 05/03/12 0804 05/06/12 0301   05/03/12 0900  metroNIDAZOLE (FLAGYL) IVPB 1 g  Status:  Discontinued    Comments:  Pharmacy may adjust dosing strength, interval, or rate of medication as needed for optimal therapy for the patient Send with patient on call to the OR.  Anesthesia to complete antibiotic administration <64min prior to incision per [facility].   1 g 200 mL/hr over 60 Minutes Intravenous Every 8 hours 05/03/12 0740 05/03/12 0812   05/03/12 0900  metroNIDAZOLE (FLAGYL) IVPB 500 mg    Comments:  Pharmacy may adjust dosing strength, interval, or rate of medication as needed for optimal therapy for the patient Send with patient on call to the OR.  Anesthesia to complete antibiotic administration <57min prior to incision per [facility].   500 mg 100 mL/hr over 60 Minutes Intravenous Every 8 hours 05/03/12 0812 05/06/12 0206   05/03/12 0745  ceFAZolin (ANCEF) 3 g in dextrose 5 % 50 mL IVPB  Status:  Discontinued     3 g 160 mL/hr over  30 Minutes Intravenous 3 times per day 05/03/12 0740 05/03/12 0802   05/03/12 0600  [MAR Hold]  metroNIDAZOLE (FLAGYL) IVPB 500 mg     (On MAR Hold since 05/02/12 2235)  Comments:  Pharmacy may adjust dosing strength, interval, or rate of medication as needed for optimal therapy for the patient Send with patient on call to the OR.  Anesthesia to complete antibiotic administration <31min prior to incision per [facility].   500 mg 100 mL/hr over 60 Minutes Intravenous On call to O.R. 05/02/12 2215 05/02/12 2358   05/03/12 0000  Ampicillin-Sulbactam (UNASYN) 3 g in sodium chloride 0.9 % 100 mL IVPB  Status:  Discontinued     3 g 100 mL/hr over 60 Minutes Intravenous 4 times per day 05/02/12 2138 05/02/12 2215   05/02/12 2200  ceFAZolin  (ANCEF) 3 g in dextrose 5 % 50 mL IVPB     3 g 160 mL/hr over 30 Minutes Intravenous 60 min pre-op 05/02/12 2115 05/02/12 2330   05/02/12 2130  Ampicillin-Sulbactam (UNASYN) 3 g in sodium chloride 0.9 % 100 mL IVPB  Status:  Discontinued     3 g 100 mL/hr over 60 Minutes Intravenous 4 times per day 05/02/12 2122 05/02/12 2138   05/02/12 2101  ceFAZolin (ANCEF) IVPB 2 g/50 mL premix  Status:  Discontinued     2 g 100 mL/hr over 30 Minutes Intravenous 60 min pre-op 05/02/12 2101 05/02/12 2114       Assessment/Plan 1. POD#4-Lap ventral hernia reduction and repair with mesh: doing well, continue diet, will d/c fluids, change to PO abx, supp today, likely home tomorrow  --change to Augmentin  --dressing changes to inferior incisions  --OOB as tolerated   --likely home tomorrow.  LOS: 4 days    WHITE, ELIZABETH 05/06/2012

## 2012-05-07 MED ORDER — KETOROLAC TROMETHAMINE 10 MG PO TABS
10.0000 mg | ORAL_TABLET | Freq: Three times a day (TID) | ORAL | Status: DC | PRN
  Filled 2012-05-07: qty 1

## 2012-05-07 MED ORDER — AMOXICILLIN-POT CLAVULANATE 875-125 MG PO TABS: 1.0000 | ORAL_TABLET | Freq: Two times a day (BID) | ORAL | Status: AC

## 2012-05-07 MED ORDER — POLYETHYLENE GLYCOL 3350 17 G PO PACK: 17.0000 g | PACK | Freq: Every day | ORAL | Status: DC

## 2012-05-07 MED ORDER — KETOROLAC TROMETHAMINE 10 MG PO TABS: 10.0000 mg | ORAL_TABLET | Freq: Three times a day (TID) | ORAL | Status: AC | PRN

## 2012-05-07 MED ORDER — TRAMADOL HCL 50 MG PO TABS: 50.0000 mg | ORAL_TABLET | Freq: Three times a day (TID) | ORAL | Status: AC | PRN

## 2012-05-07 MED ORDER — INSULIN ASPART 100 UNIT/ML ~~LOC~~ SOLN
0.0000 [IU] | Freq: Three times a day (TID) | SUBCUTANEOUS | Status: DC
Start: 2012-05-07 — End: 2012-05-07
  Administered 2012-05-07: 3 [IU] via SUBCUTANEOUS

## 2012-05-07 NOTE — Discharge Summary (Addendum)
Physician Discharge Summary  [name] Bridges WGN:[id number] DOB: 02-Jun-1940 DOA: 05/02/2012  PCP: Herb Grays, MD  Admit date: 05/02/2012 Discharge date: 05/07/2012  Time spent: 40 minutes  Recommendations for Outpatient Follow-up:  1. Home with outpt PCP and surgery follow up   Discharge Diagnoses:  Principal Problem: Colon obstrcution due to incarcerated  umbilical hernia  Active Problems:   Severe obesity (BMI >= 40)   Type II or unspecified type diabetes mellitus with unspecified complication, uncontrolled   Cellulitis of leg   Nausea and vomiting   Abdominal  pain, other specified site   Hypertension   Hypokalemia   Discharge Condition: fair  Diet recommendation: DIABETIC  Filed Weights   05/02/12 2102  Weight: 149.7 kg (330 lb 0.5 oz)    History of present illness:  72 year old female with a history of known umbilical hernia presented with one-day history of severe nausea and intractable vomiting and abdominal pain. CT scan of the abdomen and pelvis in the emergency department showed a midline ventral hernia with a knuckle of transverse colon incarcerated with signs of partial colonic obstruction. WBC was 13.3 and lactic acid was 1.7. Surgery consult was called from the ED and was emergently taken for surgery.. The patient underwent reduction of her ventral hernia with laparoscopic mesh repair and umbilectomy. Postoperatively, patient slowly improving and now tolerating regular diet.  Hospital Course:     Colon obstruction secondary to incarcerated ventral hernia  -Status post emergent laparoscopic mesh repair and hernia reduction (05/03/12)  -Surgery closely following. Advanced to regular diet and now tolerating  -Continue Pain control  -Leukocytosis on admission likely reactive and now resolved.  -patient stable to be discharged home on a course of Augmentin per surgery and follow up with them in 1 week. Daily dressing will be done by her husband at  home.  Diabetes mellitus type 2  - hemoglobin A1c-7.9  -CBG will controlled on current Lantus dose.   Hypertension  -Blood pressure fairly stable.  Patient is on valsartan at home which is resumed.   Leg edema  Patient has pitting edema bilaterally. Resumed home dose demadex.  Dizziness and arm dysesthesias  EKG and serial troponins negative. Now resolved   Morbid obesity  Counseled on diet an weight reduction       Discharge Exam: Filed Vitals:   05/06/12 0658 05/06/12 1300 05/06/12 2200 05/07/12 0540  BP: 145/67 139/81 141/84 129/78  Pulse: 80 90 113 72  Temp: 98.3 F (36.8 C) 97.9 F (36.6 C) 98.9 F (37.2 C) 98.5 F (36.9 C)  TempSrc: Oral Oral Oral Oral  Resp: 18 18 18 18   Height:      Weight:      SpO2: 95% 97% 96% 95%     General: Morbidly obese female in no acute distress HEENT: No pallor, moist oral mucosa  Cardiovascular: S1 and S2, no murmurs rub or gallop  Respiratory: Clear to auscultation bilaterally, no added sounds  Abdomen: Morbidly obese, soft, minimal tenderness over surgical site, nondistended, dressing stain with serous fluid from low incision. (Improved )  Musculoskeletal: Warm 1+ pitting edema bilaterally  CNS: AAO x3   Discharge Instructions   Future Appointments [facility] Department Dept Phone   06/02/2012 9:15 AM Ardeth Sportsman, MD [facility], [place] [phone]       Medication List    STOP taking these medications       HYDROcodone-acetaminophen 5-325 MG per tablet  Commonly known as:  NORCO/VICODIN  TAKE these medications       amoxicillin-clavulanate 875-125 MG per tablet  Commonly known as:  AUGMENTIN  Take 1 tablet by mouth 2 (two) times daily.     insulin aspart 100 UNIT/ML injection  Commonly known as:  novoLOG  Inject into the skin 3 (three) times daily before meals. Sliding scale     insulin glargine 100 UNIT/ML injection  Commonly known as:  LANTUS  Inject 20-40 Units into the skin 2 (two)  times daily. Takes 20units qAM and 40units qhs.     ketorolac 10 MG tablet  Commonly known as:  TORADOL  Take 1 tablet (10 mg total) by mouth every 8 (eight) hours as needed for pain.     meloxicam 15 MG tablet  Commonly known as:  MOBIC  Take 15 mg by mouth daily.     polyethylene glycol packet  Commonly known as:  MIRALAX / GLYCOLAX  Take 17 g by mouth daily.     potassium gluconate 595 MG Tabs  Take 595 mg by mouth daily.     simvastatin 10 MG tablet  Commonly known as:  ZOCOR  Take 10 mg by mouth at bedtime.     torsemide 20 MG tablet  Commonly known as:  DEMADEX  Take 20 mg by mouth daily.     traMADol 50 MG tablet  Commonly known as:  ULTRAM  Take 1 tablet (50 mg total) by mouth every 8 (eight) hours as needed for pain.     valsartan 160 MG tablet  Commonly known as:  DIOVAN  Take 160 mg by mouth daily.           Follow-up Information   Follow up with GROSS,STEVEN C., MD. Schedule an appointment as soon as possible for a visit in 1 week. (Our office will contact you with your appt day and time)    Contact information:   [address] [place] [address] [phone]       Follow up with Ardeth Sportsman., MD In 3 weeks.   Contact information:   [address] [place] [address] [phone]       Follow up with Herb Grays, MD In 1 week.   Contact information:   [address] [place] [address] [phone]        The results of significant diagnostics from this hospitalization (including imaging, microbiology, ancillary and laboratory) are listed below for reference.    Significant Diagnostic Studies: Ct Abdomen Pelvis W Contrast  05/02/2012  *RADIOLOGY REPORT*  Clinical Data: Abdominal pain and vomiting for 1 day.  CT ABDOMEN AND PELVIS WITH CONTRAST  Technique:  Multidetector CT imaging of the abdomen and pelvis was performed following the standard protocol during bolus  administration of intravenous contrast.  Contrast: OMNIPAQUE IOHEXOL 300 MG/ML  SOLN  Comparison: CT abdomen pelvis 04/25/2010.  Findings: The lung bases are clear.  No pleural or pericardial effusion.  Cardiomegaly is noted.  The liver, gallbladder, adrenal glands, spleen, pancreas and kidneys appear normal.  As seen on comparison study, the patient has a midline ventral hernia.  Hernia opening measures 4.6 cm transverse by 3.7 cm cranial-caudal.  New since the prior exam, a knuckle of transverse colon is contained within the hernia as well as omental fat.  There is gas and stool in the colon distal to the hernia although distal colon appears relatively decompressed compared to the ascending and proximal  transverse colon which is markedly distended with a large volume of stool present.  No pneumatosis, free air, portal venous gas or fluid collection is identified. Sigmoid diverticulosis without diverticulitis is noted.  Stomach and small bowel appear normal.  The appendix is not visualized and may have been removed.  The urinary bladder is unremarkable.  No lymphadenopathy is identified. No focal bony abnormality is seen with multilevel thoracic and lumbar spondylosis noted.  IMPRESSION:  1.  Midline ventral hernia contains a knuckle of transverse colon which is new since the prior CT.  The ascending and proximal transverse colon are distended with a large volume of stool present. The colon distal to the hernia is relatively decompressed although there is some gas and stool present.  Findings suggest partial or early colonic obstruction due to the hernia. 2.  Diverticulosis without diverticulitis.   Original Report Authenticated By: Holley Dexter, M.D.     Microbiology: Recent Results (from the past 240 hour(s))  MRSA PCR SCREENING     Status: None   Collection Time    05/02/12  9:51 PM      Result Value Range Status   MRSA by PCR NEGATIVE  NEGATIVE Final   Comment:            The GeneXpert MRSA  Assay (FDA     approved for NASAL specimens     only), is one component of a     comprehensive MRSA colonization     surveillance program. It is not     intended to diagnose MRSA     infection nor to guide or     monitor treatment for     MRSA infections.     Labs: Basic Metabolic Panel:  Recent Labs Lab 05/02/12 1604 05/03/12 0309 05/04/12 0405 05/05/12 0009  NA 134* 137 139 135  K 3.7 3.7 3.9 3.3*  CL 96 100 104 102  CO2 27 29 28 26   GLUCOSE 210* 222* 122* 141*  BUN 22 21 21 17   CREATININE 0.58 0.80 0.75 0.67  CALCIUM 9.4 8.8 8.2* 8.1*  MG  --   --  2.3  --    Liver Function Tests:  Recent Labs Lab 05/02/12 1604  AST 17  ALT 14  ALKPHOS 83  BILITOT 1.0  PROT 6.9  ALBUMIN 3.3*    Recent Labs Lab 05/02/12 1604  LIPASE 10*   No results found for this basename: AMMONIA,  in the last 168 hours CBC:  Recent Labs Lab 05/02/12 1604 05/03/12 0309 05/04/12 0405  WBC 13.3* 19.6* 9.6  NEUTROABS 11.7*  --  6.6  HGB 13.7 12.3 10.6*  HCT 40.7 38.3 32.4*  MCV 80.3 82.5 82.2  PLT 276 221 165   Cardiac Enzymes:  Recent Labs Lab 05/04/12 1230 05/04/12 1842 05/05/12 0009  TROPONINI <0.30 <0.30 <0.30   BNP: BNP (last 3 results) No results found for this basename: PROBNP,  in the last 8760 hours CBG:  Recent Labs Lab 05/06/12 0559 05/06/12 1148 05/06/12 1715 05/06/12 2213 05/07/12 0717  GLUCAP 136* 156* 159* 188* 170*       Signed:  DHUNGEL, NISHANT  [facility] 05/07/2012, 10:19 AM

## 2012-05-07 NOTE — Discharge Summary (Signed)
error 

## 2012-05-07 NOTE — Progress Notes (Signed)
Discharge instruction reviewed with patient and spouse, patient to follow up with MD within 1 week, patient is tolerating her diet no complaints of nausea or vomiting, patient is passing flatus and has had a bowel movement, patient didn't want to take miralax or torsemide before discharge, incision within normal limits, prescription given Stanford Breed RN 05-07-2012 11:01am

## 2012-05-11 ENCOUNTER — Other Ambulatory Visit: Payer: BC Managed Care – PPO

## 2012-05-13 ENCOUNTER — Telehealth (INDEPENDENT_AMBULATORY_CARE_PROVIDER_SITE_OTHER): Payer: Self-pay

## 2012-05-13 ENCOUNTER — Ambulatory Visit (INDEPENDENT_AMBULATORY_CARE_PROVIDER_SITE_OTHER): Payer: BC Managed Care – PPO | Admitting: Surgery

## 2012-05-13 VITALS — BP 130/70 | HR 72 | Temp 97.2°F | Resp 20 | Ht 62.0 in | Wt 328.0 lb

## 2012-05-13 DIAGNOSIS — K436 Other and unspecified ventral hernia with obstruction, without gangrene: Secondary | ICD-10-CM

## 2012-05-13 HISTORY — DX: Other and unspecified ventral hernia with obstruction, without gangrene: K43.6

## 2012-05-13 NOTE — Patient Instructions (Addendum)
You have a small ribbon wick draining your abodminal central incision where your bellybutton was removed.  Please remove that on Sunday, 23rd of March.  Then leave the area unpacked.  Control drainage with Band-Aid or gauze as needed.  Eventually drainage should dry up.  If it worsens, please call us.  HERNIA REPAIR: POST OP INSTRUCTIONS  1. DIET: Follow a light bland diet the first 24 hours after arrival home, such as soup, liquids, crackers, etc.  Be sure to include lots of fluids daily.  Avoid fast food or heavy meals as your are more likely to get nauseated.  Eat a low fat the next few days after surgery. 2. Take your usually prescribed home medications unless otherwise directed. 3. PAIN CONTROL: a. Pain is best controlled by a usual combination of three different methods TOGETHER: i. Ice/Heat ii. Over the counter pain medication iii. Prescription pain medication b. Most patients will experience some swelling and bruising around the hernia(s) such as the bellybutton, groins, or old incisions.  Ice packs or heating pads (30-60 minutes up to 6 times a day) will help. Use ice for the first few days to help decrease swelling and bruising, then switch to heat to help relax tight/sore spots and speed recovery.  Some people prefer to use ice alone, heat alone, alternating between ice & heat.  Experiment to what works for you.  Swelling and bruising can take several weeks to resolve.   c. It is helpful to take an over-the-counter pain medication regularly for the first few weeks.  Choose one of the following that works best for you: i. Naproxen (Aleve, etc)  Two 220mg  tabs twice a day ii. Ibuprofen (Advil, etc) Three 200mg  tabs four times a day (every meal & bedtime) iii. Acetaminophen (Tylenol, etc) 325-650mg  four times a day (every meal & bedtime) d. A  prescription for pain medication should be given to you upon discharge.  Take your pain medication as prescribed.  i. If you are having  problems/concerns with the prescription medicine (does not control pain, nausea, vomiting, rash, itching, etc), please call us [phone] to see if we need to switch you to a different pain medicine that will work better for you and/or control your side effect better. ii. If you need a refill on your pain medication, please contact your pharmacy.  They will contact our office to request authorization. Prescriptions will not be filled after 5 pm or on week-ends. 4. Avoid getting constipated.  Between the surgery and the pain medications, it is common to experience some constipation.  Increasing fluid intake and taking a fiber supplement (such as Metamucil, Citrucel, FiberCon, MiraLax, etc) 1-2 times a day regularly will usually help prevent this problem from occurring.  A mild laxative (prune juice, Milk of Magnesia, MiraLax, etc) should be taken according to package directions if there are no bowel movements after 48 hours.   5. Wash / shower every day.  You may shower over the dressings as they are waterproof.   6. You may leave the incision open to air.  You may replace a dressing/Band-Aid to cover the incision for comfort if you wish.  Continue to shower over incision(s) after the dressing is off.    7. ACTIVITIES as tolerated:   a. You may resume regular (light) daily activities beginning the next day-such as daily self-care, walking, climbing stairs-gradually increasing activities as tolerated.  If you can walk 30 minutes without difficulty, it is safe to try more intense activity  such as jogging, treadmill, bicycling, low-impact aerobics, swimming, etc. b. Save the most intensive and strenuous activity for last such as sit-ups, heavy lifting, contact sports, etc  Refrain from any heavy lifting or straining until you are off narcotics for pain control.   c. DO NOT PUSH THROUGH PAIN.  Let pain be your guide: If it hurts to do something, don't do it.  Pain is your body warning you to avoid that  activity for another week until the pain goes down. d. You may drive when you are no longer taking prescription pain medication, you can comfortably wear a seatbelt, and you can safely maneuver your car and apply brakes. e. Bonita Quin may have sexual intercourse when it is comfortable.  8. FOLLOW UP in our office a. Please call [facility] at [phone] to set up an appointment to see your surgeon in the office for a follow-up appointment approximately 2-3 weeks after your surgery. b. Make sure that you call for this appointment the day you arrive home to insure a convenient appointment time. 9.  IF YOU HAVE DISABILITY OR FAMILY LEAVE FORMS, BRING THEM TO THE OFFICE FOR PROCESSING.  DO NOT GIVE THEM TO YOUR DOCTOR.  WHEN TO CALL us [phone]: 1. Poor pain control 2. Reactions / problems with new medications (rash/itching, nausea, etc)  3. Fever over 101.5 F (38.5 C) 4. Inability to urinate 5. Nausea and/or vomiting 6. Worsening swelling or bruising 7. Continued bleeding from incision. 8. Increased pain, redness, or drainage from the incision   The clinic staff is available to answer your questions during regular business hours (8:30am-5pm).  Please don't hesitate to call and ask to speak to one of our nurses for clinical concerns.   If you have a medical emergency, go to the nearest emergency room or call 911.  A surgeon from [facility] is always on call at the hospitals in [facility], [place] [address], [address], [place], [place]  [address] ?  [address] [address], [place], [place]   [address] MAIN: [phone] ? TOLL FREE: [phone] ? FAX: [phone] [email or web address]

## 2012-05-13 NOTE — Progress Notes (Signed)
Subjective:     Patient ID: [name] Bridges, female   DOB: [birth year], 72 y.o.   MRN: [id number]  HPI  [name] Bridges  1941-01-05 [id number]  Patient Care Team: Herb Grays, MD as PCP - General (Family Medicine) Ernestene Mention, MD as Consulting Physician (General Surgery)  This patient is a 72 y.o.female who presents today for surgical evaluation   POST-OPERATIVE DIAGNOSIS: Colon obstruction due to incarcerated ventral hernia   PROCEDURE: 05/02/2012  LAPAROSCOPIC VENTRAL HERNIA Reduction & Repair with mesh  Umbilectomy  Super morbidly obese female with periumbilical ventral hernia incarcerated with colon.  Required emergent laparoscopic reduction and repair of hernia.  Stayed in the hospital for a few days.  Now she is home.  The patient comes in today feeling okay.  Healing fine.  Regular bowel movements.  Soreness controlled pain pills.  Walking with cane.  Does not like to move much.  No dressings removed.  No fevers chills or sweats.  Cannot get into for a  shower/bath.  Trying to wash the abdomen.  Continuing on Augmentin antibiotics - a few more days to go.  Patient Active Problem List  Diagnosis  . Severe obesity (BMI >= 40)  . Type II or unspecified type diabetes mellitus with unspecified complication, uncontrolled  . Cellulitis  . Nausea and vomiting  . Abdominal  pain, other specified site  . Cellulitis of left lower extremity  . Hypertension  . Colon obstruction due to incarcerated umbilical ventral wall hernia   . Hypokalemia  . Incarcerated ventral hernia s/p lap repair w mesh    Past Medical History  Diagnosis Date  . S/P appy   . Diabetes mellitus   . Hypertension   . Hyperlipidemia   . Cellulitis of left lower extremity     Begin antibiotic 04/12/12  . Gout   . Arthritis     Past Surgical History  Procedure Laterality Date  . Vaginal hysterectomy    . Ectopic pregnancy surgery    . Colonoscopy  2003, 12/17/10    2003: small adenoma 2012: small  adenoma (6-70mm) and diverticulosis  . Appendectomy    . Tonsillectomy and adenoidectomy    . Ventral hernia repair N/A 05/02/2012    Procedure: LAPAROSCOPIC VENTRAL HERNIA Repair with mesh;  Surgeon: Ardeth Sportsman, MD;  Location: [facility];  Service: General;  Laterality: N/A;  Lysis of adhesions,   Umbillectomy    History   Social History  . Marital Status: Married    Spouse Name: N/A    Number of Children: N/A  . Years of Education: N/A   Occupational History  . Not on file.   Social History Main Topics  . Smoking status: Never Smoker   . Smokeless tobacco: Never Used  . Alcohol Use: No  . Drug Use: No  . Sexually Active: Not on file   Other Topics Concern  . Not on file   Social History Narrative  . No narrative on file    Family History  Problem Relation Age of Onset  . Diabetes Mother   . Stroke Father   . Diabetes Sister   . Diabetes Brother     Current Outpatient Prescriptions  Medication Sig Dispense Refill  . amoxicillin-clavulanate (AUGMENTIN) 875-125 MG per tablet Take 1 tablet by mouth 2 (two) times daily.  10 tablet  0  . insulin aspart (NOVOLOG) 100 UNIT/ML injection Inject into the skin 3 (three) times daily before meals. Sliding scale       .  insulin glargine (LANTUS) 100 UNIT/ML injection Inject 20-40 Units into the skin 2 (two) times daily. Takes 20units qAM and 40units qhs.      Marland Kitchen ketorolac (TORADOL) 10 MG tablet Take 1 tablet (10 mg total) by mouth every 8 (eight) hours as needed for pain.  20 tablet  0  . meloxicam (MOBIC) 15 MG tablet Take 15 mg by mouth daily.      . polyethylene glycol (MIRALAX / GLYCOLAX) packet Take 17 g by mouth daily.  14 each    . potassium gluconate 595 MG TABS Take 595 mg by mouth daily.        . simvastatin (ZOCOR) 10 MG tablet Take 10 mg by mouth at bedtime.        . torsemide (DEMADEX) 20 MG tablet Take 20 mg by mouth daily.        . traMADol (ULTRAM) 50 MG tablet Take 1 tablet (50 mg total) by mouth every 8 (eight)  hours as needed for pain.  60 tablet  0  . valsartan (DIOVAN) 160 MG tablet Take 160 mg by mouth daily.         No current facility-administered medications for this visit.     Allergies  Allergen Reactions  . Codeine Nausea And Vomiting  . Sulfonamide Derivatives Nausea And Vomiting    BP 130/70  Pulse 72  Temp(Src) 97.2 F (36.2 C)  Resp 20  Ht 5\' 2"  (1.575 m)  Wt 328 lb (148.78 kg)  BMI 59.98 kg/m2  Ct Abdomen Pelvis W Contrast  05/02/2012  *RADIOLOGY REPORT*  Clinical Data: Abdominal pain and vomiting for 1 day.  CT ABDOMEN AND PELVIS WITH CONTRAST  Technique:  Multidetector CT imaging of the abdomen and pelvis was performed following the standard protocol during bolus administration of intravenous contrast.  Contrast: OMNIPAQUE IOHEXOL 300 MG/ML  SOLN  Comparison: CT abdomen pelvis 04/25/2010.  Findings: The lung bases are clear.  No pleural or pericardial effusion.  Cardiomegaly is noted.  The liver, gallbladder, adrenal glands, spleen, pancreas and kidneys appear normal.  As seen on comparison study, the patient has a midline ventral hernia.  Hernia opening measures 4.6 cm transverse by 3.7 cm cranial-caudal.  New since the prior exam, a knuckle of transverse colon is contained within the hernia as well as omental fat.  There is gas and stool in the colon distal to the hernia although distal colon appears relatively decompressed compared to the ascending and proximal transverse colon which is markedly distended with a large volume of stool present.  No pneumatosis, free air, portal venous gas or fluid collection is identified. Sigmoid diverticulosis without diverticulitis is noted.  Stomach and small bowel appear normal.  The appendix is not visualized and may have been removed.  The urinary bladder is unremarkable.  No lymphadenopathy is identified. No focal bony abnormality is seen with multilevel thoracic and lumbar spondylosis noted.  IMPRESSION:  1.  Midline ventral hernia  contains a knuckle of transverse colon which is new since the prior CT.  The ascending and proximal transverse colon are distended with a large volume of stool present. The colon distal to the hernia is relatively decompressed although there is some gas and stool present.  Findings suggest partial or early colonic obstruction due to the hernia. 2.  Diverticulosis without diverticulitis.   Original Report Authenticated By: Holley Dexter, M.D.      Review of Systems  Constitutional: Negative for fever, chills, diaphoresis, appetite change and fatigue.  HENT:  Negative for ear pain, sore throat, trouble swallowing, neck pain and ear discharge.   Eyes: Negative for photophobia, discharge and visual disturbance.  Respiratory: Negative for cough, choking, chest tightness and shortness of breath.   Cardiovascular: Negative for chest pain and palpitations.  Gastrointestinal: Positive for abdominal pain. Negative for nausea, vomiting, diarrhea, constipation, anal bleeding and rectal pain.  Genitourinary: Negative for dysuria, frequency and difficulty urinating.  Musculoskeletal: Positive for myalgias, joint swelling and arthralgias. Negative for gait problem.  Skin: Negative for color change, pallor and rash.  Neurological: Negative for dizziness, speech difficulty, weakness and numbness.  Hematological: Negative for adenopathy.  Psychiatric/Behavioral: Negative for confusion and agitation. The patient is not nervous/anxious.        Objective:   Physical Exam  Constitutional: She is oriented to person, place, and time. She appears well-developed and well-nourished. No distress.  HENT:  Head: Normocephalic.  Mouth/Throat: Oropharynx is clear and moist. No oropharyngeal exudate.  Eyes: Conjunctivae and EOM are normal. Pupils are equal, round, and reactive to light. No scleral icterus.  Neck: Normal range of motion. Neck supple. No tracheal deviation present.  Cardiovascular: Normal rate, regular  rhythm and intact distal pulses.   Pulmonary/Chest: Effort normal and breath sounds normal. No respiratory distress. She exhibits no tenderness.  Abdominal: Soft. She exhibits no distension and no mass. There is no tenderness. Hernia confirmed negative in the right inguinal area and confirmed negative in the left inguinal area.    Genitourinary: No vaginal discharge found.  Musculoskeletal: Normal range of motion. She exhibits no tenderness.  Lymphadenopathy:    She has no cervical adenopathy.       Right: No inguinal adenopathy present.       Left: No inguinal adenopathy present.  Neurological: She is alert and oriented to person, place, and time. No cranial nerve deficit. She exhibits normal muscle tone. Coordination normal.  Skin: Skin is warm and dry. No rash noted. She is not diaphoretic. No erythema.  Psychiatric: She has a normal mood and affect. Her behavior is normal. Judgment and thought content normal.       Assessment:     Recovering relatively well so far status post a emergent laparoscopic reduction and repair of incarcerated ventral wall incisional hernia.    Plan:     Complete antibiotics for lower extremity cellulitis.  And removed all dressings and wicks.  I replaced one wick in the periumbilical incision.  I instructed her to remove it a few days and just keep the area of impact.  Use dry dressings to catch any drainage.  Keep the area clean and dry.  Soap and water.  Increase activity as tolerated to regular activity.  Do not push through pain.  Consider more aggressive pain control so that she can be more active.  She tells me she has not tramadol home and does not need a refill.  Diet as tolerated. Bowel regimen to avoid problems.  Return to clinic 3 weeks.   Instructions discussed.  Followup with primary care physician for other health issues as would normally be done.  Questions answered.  The patient expressed understanding and appreciation

## 2012-05-13 NOTE — Telephone Encounter (Signed)
LMOM giving pt her f/u appt with Dr Michaell Cowing for 06/02/12 arrive at 10:45 for 11:00.

## 2012-05-26 ENCOUNTER — Encounter (INDEPENDENT_AMBULATORY_CARE_PROVIDER_SITE_OTHER): Payer: BC Managed Care – PPO | Admitting: General Surgery

## 2012-06-02 ENCOUNTER — Encounter (INDEPENDENT_AMBULATORY_CARE_PROVIDER_SITE_OTHER): Payer: Self-pay | Admitting: Surgery

## 2012-06-02 ENCOUNTER — Ambulatory Visit (INDEPENDENT_AMBULATORY_CARE_PROVIDER_SITE_OTHER): Payer: BC Managed Care – PPO | Admitting: Surgery

## 2012-06-02 ENCOUNTER — Encounter (INDEPENDENT_AMBULATORY_CARE_PROVIDER_SITE_OTHER): Payer: Self-pay

## 2012-06-02 ENCOUNTER — Encounter (INDEPENDENT_AMBULATORY_CARE_PROVIDER_SITE_OTHER): Payer: BC Managed Care – PPO | Admitting: Surgery

## 2012-06-02 VITALS — BP 154/82 | HR 76 | Temp 97.4°F | Resp 20 | Ht 62.5 in | Wt 320.0 lb

## 2012-06-02 DIAGNOSIS — K436 Other and unspecified ventral hernia with obstruction, without gangrene: Secondary | ICD-10-CM

## 2012-06-02 DIAGNOSIS — K56609 Unspecified intestinal obstruction, unspecified as to partial versus complete obstruction: Secondary | ICD-10-CM

## 2012-06-02 NOTE — Progress Notes (Signed)
Subjective:     Patient ID: [name] Bridges, female   DOB: [birth year], 72 y.o.   MRN: [id number]  HPI   AMEILIA Bridges  November 17, 1940 [id number]  Patient Care Team: Herb Grays, MD as PCP - General (Family Medicine) Ernestene Mention, MD as Consulting Physician (General Surgery)  This patient is a 72 y.o.female who presents today for surgical evaluation   POST-OPERATIVE DIAGNOSIS: Colon obstruction due to incarcerated ventral hernia   PROCEDURE: 05/02/2012  LAPAROSCOPIC VENTRAL HERNIA Reduction & Repair with mesh  Umbilectomy  Super morbidly obese female with periumbilical ventral hernia incarcerated with colon.  Required emergent laparoscopic reduction and repair of hernia.  Removed her umbilicus as well.  Stayed in the hospital for a few days.  Now she is home.   The patient comes in today feeling much better.  Healing fine.  Regular bowel movements.  Soreness less and at LLQ only - tramadol 1x/day controlling.  Walking with cane.    Patient Active Problem List  Diagnosis  . Severe obesity (BMI >= 40)  . Type II or unspecified type diabetes mellitus with unspecified complication, uncontrolled  . Abdominal  pain, other specified site  . Cellulitis of left lower extremity  . Hypertension  . Colon obstruction due to incarcerated umbilical ventral wall hernia   . Hypokalemia  . Incarcerated ventral hernia s/p lap repair w mesh    Past Medical History  Diagnosis Date  . S/P appy   . Diabetes mellitus   . Hypertension   . Hyperlipidemia   . Cellulitis of left lower extremity     Begin antibiotic 04/12/12  . Gout   . Arthritis     Past Surgical History  Procedure Laterality Date  . Vaginal hysterectomy    . Ectopic pregnancy surgery    . Colonoscopy  2003, 12/17/10    2003: small adenoma 2012: small adenoma (6-9mm) and diverticulosis  . Appendectomy    . Tonsillectomy and adenoidectomy    . Ventral hernia repair N/A 05/02/2012    Procedure: LAPAROSCOPIC VENTRAL HERNIA  Repair with mesh;  Surgeon: Ardeth Sportsman, MD;  Location: [facility];  Service: General;  Laterality: N/A;  Lysis of adhesions,   Umbillectomy    History   Social History  . Marital Status: Married    Spouse Name: N/A    Number of Children: N/A  . Years of Education: N/A   Occupational History  . Not on file.   Social History Main Topics  . Smoking status: Never Smoker   . Smokeless tobacco: Never Used  . Alcohol Use: No  . Drug Use: No  . Sexually Active: Not on file   Other Topics Concern  . Not on file   Social History Narrative  . No narrative on file    Family History  Problem Relation Age of Onset  . Diabetes Mother   . Stroke Father   . Diabetes Sister   . Diabetes Brother     Current Outpatient Prescriptions  Medication Sig Dispense Refill  . insulin aspart (NOVOLOG) 100 UNIT/ML injection Inject into the skin 3 (three) times daily before meals. Sliding scale       . insulin glargine (LANTUS) 100 UNIT/ML injection Inject 20-40 Units into the skin 2 (two) times daily. Takes 20units qAM and 40units qhs.      Marland Kitchen ketorolac (TORADOL) 10 MG tablet Take 1 tablet (10 mg total) by mouth every 8 (eight) hours as needed for pain.  20 tablet  0  . meloxicam (MOBIC) 15 MG tablet Take 15 mg by mouth daily.      . polyethylene glycol (MIRALAX / GLYCOLAX) packet Take 17 g by mouth daily.  14 each    . potassium gluconate 595 MG TABS Take 595 mg by mouth daily.        . simvastatin (ZOCOR) 10 MG tablet Take 10 mg by mouth at bedtime.        . torsemide (DEMADEX) 20 MG tablet Take 20 mg by mouth daily.        . traMADol (ULTRAM) 50 MG tablet Take 1 tablet (50 mg total) by mouth every 8 (eight) hours as needed for pain.  60 tablet  0  . valsartan (DIOVAN) 160 MG tablet Take 160 mg by mouth daily.         No current facility-administered medications for this visit.     Allergies  Allergen Reactions  . Codeine Nausea And Vomiting  . Sulfonamide Derivatives Nausea And Vomiting     BP 154/82  Pulse 76  Temp(Src) 97.4 F (36.3 C) (Temporal)  Resp 20  Ht 5' 2.5" (1.588 m)  Wt 320 lb (145.151 kg)  BMI 57.56 kg/m2  Ct Abdomen Pelvis W Contrast  05/02/2012  *RADIOLOGY REPORT*  Clinical Data: Abdominal pain and vomiting for 1 day.  CT ABDOMEN AND PELVIS WITH CONTRAST  Technique:  Multidetector CT imaging of the abdomen and pelvis was performed following the standard protocol during bolus administration of intravenous contrast.  Contrast: OMNIPAQUE IOHEXOL 300 MG/ML  SOLN  Comparison: CT abdomen pelvis 04/25/2010.  Findings: The lung bases are clear.  No pleural or pericardial effusion.  Cardiomegaly is noted.  The liver, gallbladder, adrenal glands, spleen, pancreas and kidneys appear normal.  As seen on comparison study, the patient has a midline ventral hernia.  Hernia opening measures 4.6 cm transverse by 3.7 cm cranial-caudal.  New since the prior exam, a knuckle of transverse colon is contained within the hernia as well as omental fat.  There is gas and stool in the colon distal to the hernia although distal colon appears relatively decompressed compared to the ascending and proximal transverse colon which is markedly distended with a large volume of stool present.  No pneumatosis, free air, portal venous gas or fluid collection is identified. Sigmoid diverticulosis without diverticulitis is noted.  Stomach and small bowel appear normal.  The appendix is not visualized and may have been removed.  The urinary bladder is unremarkable.  No lymphadenopathy is identified. No focal bony abnormality is seen with multilevel thoracic and lumbar spondylosis noted.  IMPRESSION:  1.  Midline ventral hernia contains a knuckle of transverse colon which is new since the prior CT.  The ascending and proximal transverse colon are distended with a large volume of stool present. The colon distal to the hernia is relatively decompressed although there is some gas and stool present.  Findings  suggest partial or early colonic obstruction due to the hernia. 2.  Diverticulosis without diverticulitis.   Original Report Authenticated By: Holley Dexter, M.D.      Review of Systems  Constitutional: Negative for fever, chills, diaphoresis, appetite change and fatigue.  HENT: Negative for ear pain, sore throat, trouble swallowing, neck pain and ear discharge.   Eyes: Negative for photophobia, discharge and visual disturbance.  Respiratory: Negative for cough, choking, chest tightness and shortness of breath.   Cardiovascular: Negative for chest pain and palpitations.  Gastrointestinal: Positive for abdominal pain. Negative for  nausea, vomiting, diarrhea, constipation, anal bleeding and rectal pain.  Genitourinary: Negative for dysuria, frequency and difficulty urinating.  Musculoskeletal: Positive for myalgias, joint swelling and arthralgias. Negative for gait problem.  Skin: Negative for color change, pallor and rash.  Neurological: Negative for dizziness, speech difficulty, weakness and numbness.  Hematological: Negative for adenopathy.  Psychiatric/Behavioral: Negative for confusion and agitation. The patient is not nervous/anxious.        Objective:   Physical Exam  Constitutional: She is oriented to person, place, and time. She appears well-developed and well-nourished. No distress.  HENT:  Head: Normocephalic.  Mouth/Throat: Oropharynx is clear and moist. No oropharyngeal exudate.  Eyes: Conjunctivae and EOM are normal. Pupils are equal, round, and reactive to light. No scleral icterus.  Neck: Normal range of motion. Neck supple. No tracheal deviation present.  Cardiovascular: Normal rate, regular rhythm and intact distal pulses.   Pulmonary/Chest: Effort normal and breath sounds normal. No respiratory distress. She exhibits no tenderness.  Abdominal: Soft. She exhibits no distension and no mass. There is no tenderness. Hernia confirmed negative in the right inguinal area  and confirmed negative in the left inguinal area.    Genitourinary: No vaginal discharge found.  Musculoskeletal: Normal range of motion. She exhibits no tenderness.  Lymphadenopathy:    She has no cervical adenopathy.       Right: No inguinal adenopathy present.       Left: No inguinal adenopathy present.  Neurological: She is alert and oriented to person, place, and time. No cranial nerve deficit. She exhibits normal muscle tone. Coordination normal.  Skin: Skin is warm and dry. No rash noted. She is not diaphoretic. No erythema.  Psychiatric: She has a normal mood and affect. Her behavior is normal. Judgment and thought content normal.       Assessment:     Recovering relatively well 1 month status post a emergent laparoscopic reduction and repair of incarcerated ventral wall incisional hernia.    Plan:     Increase activity as tolerated to regular activity.  Do not push through pain.  Consider more aggressive pain control so that she can be more active.  She tells me she has tramadol at home and does not need a refill.  I stressed to her losing weight would be her best protection against a hernia recurrence and also way to improve her health overall.  Diet as tolerated. Bowel regimen to avoid problems.  Return to clinic PRN.   Instructions discussed.  Followup with primary care physician for other health issues as would normally be done.  Questions answered.  The patient expressed understanding and appreciation

## 2012-06-02 NOTE — Patient Instructions (Addendum)
Hernia A hernia occurs when an internal organ pushes out through a weak spot in the abdominal wall. Hernias most commonly occur in the groin and around the navel. Hernias often can be pushed back into place (reduced). Most hernias tend to get worse over time. Some abdominal hernias can get stuck in the opening (irreducible or incarcerated hernia) and cannot be reduced. An irreducible abdominal hernia which is tightly squeezed into the opening is at risk for impaired blood supply (strangulated hernia). A strangulated hernia is a medical emergency. Because of the risk for an irreducible or strangulated hernia, surgery may be recommended to repair a hernia. CAUSES   Heavy lifting.  Prolonged coughing.  Straining to have a bowel movement.  A cut (incision) made during an abdominal surgery. HOME CARE INSTRUCTIONS   Bed rest is not required. You may continue your normal activities.  Avoid lifting more than 10 pounds (4.5 kg) or straining.  Cough gently. If you are a smoker it is best to stop. Even the best hernia repair can break down with the continual strain of coughing. Even if you do not have your hernia repaired, a cough will continue to aggravate the problem.  Do not wear anything tight over your hernia. Do not try to keep it in with an outside bandage or truss. These can damage abdominal contents if they are trapped within the hernia sac.  Eat a normal diet.  Avoid constipation. Straining over long periods of time will increase hernia size and encourage breakdown of repairs. If you cannot do this with diet alone, stool softeners may be used. SEEK IMMEDIATE MEDICAL CARE IF:   You have a fever.  You develop increasing abdominal pain.  You feel nauseous or vomit.  Your hernia is stuck outside the abdomen, looks discolored, feels hard, or is tender.  You have any changes in your bowel habits or in the hernia that are unusual for you.  You have increased pain or swelling around the  hernia.  You cannot push the hernia back in place by applying gentle pressure while lying down. MAKE SURE YOU:   Understand these instructions.  Will watch your condition.  Will get help right away if you are not doing well or get worse. Document Released: 02/10/2005 Document Revised: 05/05/2011 Document Reviewed: 09/30/2007 [facility] Patient Information 2013 [place], [place].  Exercise to Lose Weight Exercise and a healthy diet may help you lose weight. Your doctor may suggest specific exercises. EXERCISE IDEAS AND TIPS  Choose low-cost things you enjoy doing, such as walking, bicycling, or exercising to workout videos.  Take stairs instead of the elevator.  Walk during your lunch break.  Park your car further away from work or school.  Go to a gym or an exercise class.  Start with 5 to 10 minutes of exercise each day. Build up to 30 minutes of exercise 4 to 6 days a week.  Wear shoes with good support and comfortable clothes.  Stretch before and after working out.  Work out until you breathe harder and your heart beats faster.  Drink extra water when you exercise.  Do not do so much that you hurt yourself, feel dizzy, or get very short of breath. Exercises that burn about 150 calories:  Running 1  miles in 15 minutes.  Playing volleyball for 45 to 60 minutes.  Washing and waxing a car for 45 to 60 minutes.  Playing touch football for 45 minutes.  Walking 1  miles in 35 minutes.  Pushing  a stroller 1  miles in 30 minutes.  Playing basketball for 30 minutes.  Raking leaves for 30 minutes.  Bicycling 5 miles in 30 minutes.  Walking 2 miles in 30 minutes.  Dancing for 30 minutes.  Shoveling snow for 15 minutes.  Swimming laps for 20 minutes.  Walking up stairs for 15 minutes.  Bicycling 4 miles in 15 minutes.  Gardening for 30 to 45 minutes.  Jumping rope for 15 minutes.  Washing windows or floors for 45 to 60 minutes. Document Released:  03/15/2010 Document Revised: 05/05/2011 Document Reviewed: 03/15/2010 [facility] Patient Information 2013 [place], [place].  Calorie Counting Diet A calorie counting diet requires you to eat the number of calories that are right for you in a day. Calories are the measurement of how much energy you get from the food you eat. Eating the right amount of calories is important for staying at a healthy weight. If you eat too many calories, your body will store them as fat and you may gain weight. If you eat too few calories, you may lose weight. Counting the number of calories you eat during a day will help you know if you are eating the right amount. A Registered Dietitian can determine how many calories you need in a day. The amount of calories needed varies from person to person. If your goal is to lose weight, you will need to eat fewer calories. Losing weight can benefit you if you are overweight or have health problems such as heart disease, high blood pressure, or diabetes. If your goal is to gain weight, you will need to eat more calories. Gaining weight may be necessary if you have a certain health problem that causes your body to need more energy. TIPS Whether you are increasing or decreasing the number of calories you eat during a day, it may be hard to get used to changes in what you eat and drink. The following are tips to help you keep track of the number of calories you eat.  Measure foods at home with measuring cups. This helps you know the amount of food and number of calories you are eating.  Restaurants often serve food in amounts that are larger than 1 serving. While eating out, estimate how many servings of a food you are given. For example, a serving of cooked rice is  cup or about the size of half of a fist. Knowing serving sizes will help you be aware of how much food you are eating at restaurants.  Ask for smaller portion sizes or child-size portions at restaurants.  Plan to eat half  of a meal at a restaurant. Take the rest home or share the other half with a friend.  Read the Nutrition Facts panel on food labels for calorie content and serving size. You can find out how many servings are in a package, the size of a serving, and the number of calories each serving has.  For example, a package might contain 3 cookies. The Nutrition Facts panel on that package says that 1 serving is 1 cookie. Below that, it will say there are 3 servings in the container. The calories section of the Nutrition Facts label says there are 90 calories. This means there are 90 calories in 1 cookie (1 serving). If you eat 1 cookie you have eaten 90 calories. If you eat all 3 cookies, you have eaten 270 calories (3 servings x 90 calories = 270 calories). The list below tells you how big  or small some common portion sizes are.  1 oz.........4 stacked dice.  3 oz........Marland KitchenDeck of cards.  1 tsp.......Marland KitchenTip of little finger.  1 tbs......Marland KitchenMarland KitchenThumb.  2 tbs.......Marland KitchenGolf ball.   cup......Marland KitchenHalf of a fist.  1 cup.......Marland KitchenA fist. KEEP A FOOD LOG Write down every food item you eat, the amount you eat, and the number of calories in each food you eat during the day. At the end of the day, you can add up the total number of calories you have eaten. It may help to keep a list like the one below. Find out the calorie information by reading the Nutrition Facts panel on food labels. Breakfast  Bran cereal (1 cup, 110 calories).  Fat-free milk ( cup, 45 calories). Snack  Apple (1 medium, 80 calories). Lunch  Spinach (1 cup, 20 calories).  Tomato ( medium, 20 calories).  Chicken breast strips (3 oz, 165 calories).  Shredded cheddar cheese ( cup, 110 calories).  Light [place] dressing (2 tbs, 60 calories).  Whole-wheat bread (1 slice, 80 calories).  Tub margarine (1 tsp, 35 calories).  Vegetable soup (1 cup, 160 calories). Dinner  Pork chop (3 oz, 190 calories).  Brown rice (1 cup, 215  calories).  Steamed broccoli ( cup, 20 calories).  Strawberries (1  cup, 65 calories).  Whipped cream (1 tbs, 50 calories). Daily Calorie Total: 1425 Document Released: 02/10/2005 Document Revised: 05/05/2011 Document Reviewed: 08/07/2006 [facility] Patient Information 2013 [place], [place].

## 2012-06-22 DIAGNOSIS — I872 Venous insufficiency (chronic) (peripheral): Secondary | ICD-10-CM | POA: Insufficient documentation

## 2012-07-26 DIAGNOSIS — M064 Inflammatory polyarthropathy: Secondary | ICD-10-CM | POA: Insufficient documentation

## 2012-07-30 ENCOUNTER — Encounter (INDEPENDENT_AMBULATORY_CARE_PROVIDER_SITE_OTHER): Payer: Self-pay | Admitting: Surgery

## 2012-07-30 ENCOUNTER — Ambulatory Visit (INDEPENDENT_AMBULATORY_CARE_PROVIDER_SITE_OTHER): Payer: BC Managed Care – PPO | Admitting: Surgery

## 2012-07-30 DIAGNOSIS — R1032 Left lower quadrant pain: Secondary | ICD-10-CM

## 2012-07-30 DIAGNOSIS — K436 Other and unspecified ventral hernia with obstruction, without gangrene: Secondary | ICD-10-CM

## 2012-07-30 NOTE — Progress Notes (Signed)
Subjective:     Patient ID: [name] Bridges, female   DOB: April 18, 1940, 72 y.o.   MRN: [id number]  HPI   [name] Bridges  1941/01/27 [id number]  Patient Care Team: Herb Grays, MD as PCP - General (Family Medicine) Ernestene Mention, MD as Consulting Physician (General Surgery)  This patient is a 72 y.o.female who presents today for surgical evaluation s/p surgery 05/02/2012   POST-OPERATIVE DIAGNOSIS: Colon obstruction due to incarcerated ventral hernia   PROCEDURE: 05/02/2012  LAPAROSCOPIC VENTRAL HERNIA Reduction & Repair with mesh  Umbilectomy  Super morbidly obese female with periumbilical ventral hernia incarcerated with colon.  Required emergent laparoscopic reduction and repair of hernia.  Removed her umbilicus as well.  Stayed in the hospital for a few days.  Now she is home.  The patient comes in today feeling OK.  Healing fine.  Regular bowel movements.  However, she has had some intermittent sharp pains at LLQ only - Worse with coughing and moving.  Usually fades away with the help of tramadol.  Walking with cane.    Patient Active Problem List   Diagnosis Date Noted  . Incarcerated ventral hernia s/p lap repair w mesh 05/13/2012  . Hypokalemia 05/06/2012  . Colon obstruction due to incarcerated umbilical ventral wall hernia  05/03/2012  . Abdominal  pain, other specified site 05/02/2012  . Cellulitis of left lower extremity 05/02/2012  . Hypertension 05/02/2012  . Severe obesity (BMI >= 40) 04/12/2012  . Type II or unspecified type diabetes mellitus with unspecified complication, uncontrolled 04/12/2012    Past Medical History  Diagnosis Date  . S/P appy   . Diabetes mellitus   . Hypertension   . Hyperlipidemia   . Cellulitis of left lower extremity     Begin antibiotic 04/12/12  . Gout   . Arthritis     Past Surgical History  Procedure Laterality Date  . Vaginal hysterectomy    . Ectopic pregnancy surgery    . Colonoscopy  2003, 12/17/10    2003: small  adenoma 2012: small adenoma (6-87mm) and diverticulosis  . Appendectomy    . Tonsillectomy and adenoidectomy    . Ventral hernia repair N/A 05/02/2012    Procedure: LAPAROSCOPIC VENTRAL HERNIA Repair with mesh;  Surgeon: Ardeth Sportsman, MD;  Location: [facility];  Service: General;  Laterality: N/A;  Lysis of adhesions,   Umbillectomy    History   Social History  . Marital Status: Married    Spouse Name: N/A    Number of Children: N/A  . Years of Education: N/A   Occupational History  . Not on file.   Social History Main Topics  . Smoking status: Never Smoker   . Smokeless tobacco: Never Used  . Alcohol Use: No  . Drug Use: No  . Sexually Active: Not on file   Other Topics Concern  . Not on file   Social History Narrative  . No narrative on file    Family History  Problem Relation Age of Onset  . Diabetes Mother   . Stroke Father   . Diabetes Sister   . Diabetes Brother     Current Outpatient Prescriptions  Medication Sig Dispense Refill  . insulin aspart (NOVOLOG) 100 UNIT/ML injection Inject into the skin 3 (three) times daily before meals. Sliding scale       . insulin glargine (LANTUS) 100 UNIT/ML injection Inject 20-40 Units into the skin 2 (two) times daily. Takes 20units qAM and 40units qhs.      Marland Kitchen  ketorolac (TORADOL) 10 MG tablet Take 1 tablet (10 mg total) by mouth every 8 (eight) hours as needed for pain.  20 tablet  0  . meloxicam (MOBIC) 15 MG tablet Take 15 mg by mouth daily.      . polyethylene glycol (MIRALAX / GLYCOLAX) packet Take 17 g by mouth daily.  14 each    . potassium gluconate 595 MG TABS Take 595 mg by mouth daily.        . simvastatin (ZOCOR) 10 MG tablet Take 10 mg by mouth at bedtime.        . torsemide (DEMADEX) 20 MG tablet Take 20 mg by mouth daily.        . traMADol (ULTRAM) 50 MG tablet Take 1 tablet (50 mg total) by mouth every 8 (eight) hours as needed for pain.  60 tablet  0  . valsartan (DIOVAN) 160 MG tablet Take 160 mg by mouth  daily.         No current facility-administered medications for this visit.     Allergies  Allergen Reactions  . Codeine Nausea And Vomiting  . Sulfonamide Derivatives Nausea And Vomiting    BP 134/80  Pulse 72  Temp(Src) 98.2 F (36.8 C) (Temporal)  Resp 16  Ht 5' 2.5" (1.588 m)  Wt 322 lb 3.2 oz (146.149 kg)  BMI 57.96 kg/m2  SpO2 98%  Ct Abdomen Pelvis W Contrast  05/02/2012  *RADIOLOGY REPORT*  Clinical Data: Abdominal pain and vomiting for 1 day.  CT ABDOMEN AND PELVIS WITH CONTRAST  Technique:  Multidetector CT imaging of the abdomen and pelvis was performed following the standard protocol during bolus administration of intravenous contrast.  Contrast: OMNIPAQUE IOHEXOL 300 MG/ML  SOLN  Comparison: CT abdomen pelvis 04/25/2010.  Findings: The lung bases are clear.  No pleural or pericardial effusion.  Cardiomegaly is noted.  The liver, gallbladder, adrenal glands, spleen, pancreas and kidneys appear normal.  As seen on comparison study, the patient has a midline ventral hernia.  Hernia opening measures 4.6 cm transverse by 3.7 cm cranial-caudal.  New since the prior exam, a knuckle of transverse colon is contained within the hernia as well as omental fat.  There is gas and stool in the colon distal to the hernia although distal colon appears relatively decompressed compared to the ascending and proximal transverse colon which is markedly distended with a large volume of stool present.  No pneumatosis, free air, portal venous gas or fluid collection is identified. Sigmoid diverticulosis without diverticulitis is noted.  Stomach and small bowel appear normal.  The appendix is not visualized and may have been removed.  The urinary bladder is unremarkable.  No lymphadenopathy is identified. No focal bony abnormality is seen with multilevel thoracic and lumbar spondylosis noted.  IMPRESSION:  1.  Midline ventral hernia contains a knuckle of transverse colon which is new since the prior  CT.  The ascending and proximal transverse colon are distended with a large volume of stool present. The colon distal to the hernia is relatively decompressed although there is some gas and stool present.  Findings suggest partial or early colonic obstruction due to the hernia. 2.  Diverticulosis without diverticulitis.   Original Report Authenticated By: Holley Dexter, M.D.      Review of Systems  Constitutional: Negative for fever, chills, diaphoresis, appetite change and fatigue.  HENT: Negative for ear pain, sore throat, trouble swallowing, neck pain and ear discharge.   Eyes: Negative for photophobia, discharge and visual  disturbance.  Respiratory: Negative for cough, choking, chest tightness and shortness of breath.   Cardiovascular: Negative for chest pain and palpitations.  Gastrointestinal: Positive for abdominal pain. Negative for nausea, vomiting, diarrhea, constipation, anal bleeding and rectal pain.  Genitourinary: Negative for dysuria, frequency and difficulty urinating.  Musculoskeletal: Positive for myalgias, joint swelling and arthralgias. Negative for gait problem.  Skin: Negative for color change, pallor and rash.  Neurological: Negative for dizziness, speech difficulty, weakness and numbness.  Hematological: Negative for adenopathy.  Psychiatric/Behavioral: Negative for confusion and agitation. The patient is not nervous/anxious.        Objective:   Physical Exam  Constitutional: She is oriented to person, place, and time. She appears well-developed and well-nourished. No distress.  HENT:  Head: Normocephalic.  Mouth/Throat: Oropharynx is clear and moist. No oropharyngeal exudate.  Eyes: Conjunctivae and EOM are normal. Pupils are equal, round, and reactive to light. No scleral icterus.  Neck: Normal range of motion. Neck supple. No tracheal deviation present.  Cardiovascular: Normal rate, regular rhythm and intact distal pulses.   Pulmonary/Chest: Effort normal  and breath sounds normal. No respiratory distress. She exhibits no tenderness.  Abdominal: Soft. She exhibits no distension and no mass. There is no tenderness. Hernia confirmed negative in the right inguinal area and confirmed negative in the left inguinal area.    Genitourinary: No vaginal discharge found.  Musculoskeletal: Normal range of motion. She exhibits no tenderness.  Lymphadenopathy:    She has no cervical adenopathy.       Right: No inguinal adenopathy present.       Left: No inguinal adenopathy present.  Neurological: She is alert and oriented to person, place, and time. No cranial nerve deficit. She exhibits normal muscle tone. Coordination normal.  Skin: Skin is warm and dry. No rash noted. She is not diaphoretic. No erythema.  Psychiatric: She has a normal mood and affect. Her behavior is normal. Judgment and thought content normal.       Assessment:     Recovering relatively well except for musculoskeletal pull left lower quadrant  2.5 month status post a emergent laparoscopic reduction and repair of incarcerated ventral wall incisional hernia.    Plan:     I believe this is just a strain, no evidence of recurrence hernia.  No evidence of infection or cellulitis.  I recommended nonsteroidals such as naproxen twice a day and heat six times a day.  See if that improves.  If no improvement by three weeks, add Neurontin or amitriptyline for neuropathic pain.  Reserve local block injection for persistent pain after three months.  Hopefully will not come to that  Increase activity as tolerated to regular activity.  Do not push through pain.  Consider more aggressive pain control so that she can be more active.  She tells me she has tramadol at home and does not need a refill.  I stressed to her losing weight would be her best protection against a hernia recurrence and also way to improve her health overall.  Diet as tolerated. Bowel regimen to avoid problems.  Return to  clinic PRN.   Instructions discussed.  Followup with primary care physician for other health issues as would normally be done.  Questions answered.  The patient expressed understanding and appreciation

## 2012-07-30 NOTE — Patient Instructions (Signed)
Managing Pain  Pain after surgery or related to activity is often due to strain/injury to muscle, tendon, nerves and/or incisions.  This pain is usually short-term and will improve in a few months.   Many people find it helpful to do the following things TOGETHER to help speed the process of healing and to get back to regular activity more quickly:  1. Avoid heavy physical activity a.  no lifting greater than 20 pounds b. Do not "push through" the pain.  Listen to your body and avoid positions and maneuvers than reproduce the pain c. Walking is okay as tolerated, but go slowly and stop when getting sore.  d. Remember: If it hurts to do it, then don't do it! 2. Take Anti-inflammatory medication  a. Take with food/snack around the clock for 1-2 weeks i. This helps the muscle and nerve tissues become less irritable and calm down faster b. Choose ONE of the following over-the-counter medications: i. Naproxen 220mg  tabs (ex. Aleve) 1-2 pills twice a day  ii. Ibuprofen 200mg  tabs (ex. Advil, Motrin) 3-4 pills with every meal and just before bedtime iii. Acetaminophen 500mg  tabs (Tylenol) 1-2 pills with every meal and just before bedtime 3. Use a Heating pad or Ice/Cold Pack a. 4-6 times a day b. May use warm bath/hottub  or showers 4. Try Gentle Massage and/or Stretching  a. at the area of pain many times a day b. stop if you feel pain - do not overdo it  Try these steps together to help you body heal faster and avoid making things get worse.  Doing just one of these things may not be enough.    If you are not getting better after two weeks or are noticing you are getting worse, contact our office for further advice; we may need to re-evaluate you & see what other things we can do to help.  Muscle Strain Muscle strain occurs when a muscle is stretched beyond its normal length. A small number of muscle fibers generally are torn. This is especially common in athletes. This happens when a  sudden, violent force placed on a muscle stretches it too far. Usually, recovery from muscle strain takes 1 to 2 weeks. Complete healing will take 5 to 6 weeks.  HOME CARE INSTRUCTIONS   While awake, apply ice to the sore muscle for the first 2 days after the injury.  Put ice in a plastic bag.  Place a towel between your skin and the bag.  Leave the ice on for 15-20 minutes each hour.  Do not use the strained muscle for several days, until you no longer have pain.  You may wrap the injured area with an elastic bandage for comfort. Be careful not to wrap it too tightly. This may interfere with blood circulation or increase swelling.  Only take over-the-counter or prescription medicines for pain, discomfort, or fever as directed by your caregiver. SEEK MEDICAL CARE IF:  You have increasing pain or swelling in the injured area. MAKE SURE YOU:   Understand these instructions.  Will watch your condition.  Will get help right away if you are not doing well or get worse. Document Released: 02/10/2005 Document Revised: 05/05/2011 Document Reviewed: 02/22/2011 [facility] Patient Information 2014 [place], [place].

## 2012-09-23 ENCOUNTER — Other Ambulatory Visit: Payer: Self-pay | Admitting: Orthopedic Surgery

## 2012-09-23 DIAGNOSIS — M545 Low back pain: Secondary | ICD-10-CM

## 2012-09-28 ENCOUNTER — Ambulatory Visit
Admission: RE | Admit: 2012-09-28 | Discharge: 2012-09-28 | Disposition: A | Discharge status: Home / Self Care | Payer: Medicare Other | Source: Ambulatory Visit | Attending: Orthopedic Surgery | Admitting: Orthopedic Surgery

## 2012-09-28 DIAGNOSIS — M545 Low back pain: Secondary | ICD-10-CM

## 2013-11-02 DIAGNOSIS — E785 Hyperlipidemia, unspecified: Secondary | ICD-10-CM

## 2013-11-02 DIAGNOSIS — E1169 Type 2 diabetes mellitus with other specified complication: Secondary | ICD-10-CM | POA: Insufficient documentation

## 2014-03-08 ENCOUNTER — Encounter (HOSPITAL_COMMUNITY): Payer: Self-pay | Admitting: Emergency Medicine

## 2014-03-08 ENCOUNTER — Inpatient Hospital Stay (HOSPITAL_COMMUNITY)
Admission: EM | Admit: 2014-03-08 | Discharge: 2014-03-13 | DRG: 603 | Disposition: A | Discharge status: Home / Self Care | Payer: Medicare HMO | Attending: Internal Medicine | Admitting: Internal Medicine

## 2014-03-08 DIAGNOSIS — S71102A Unspecified open wound, left thigh, initial encounter: Secondary | ICD-10-CM | POA: Diagnosis present

## 2014-03-08 DIAGNOSIS — I10 Essential (primary) hypertension: Secondary | ICD-10-CM | POA: Diagnosis present

## 2014-03-08 DIAGNOSIS — E785 Hyperlipidemia, unspecified: Secondary | ICD-10-CM | POA: Diagnosis present

## 2014-03-08 DIAGNOSIS — L03119 Cellulitis of unspecified part of limb: Secondary | ICD-10-CM

## 2014-03-08 DIAGNOSIS — Z6841 Body Mass Index (BMI) 40.0 and over, adult: Secondary | ICD-10-CM | POA: Diagnosis not present

## 2014-03-08 DIAGNOSIS — M1990 Unspecified osteoarthritis, unspecified site: Secondary | ICD-10-CM | POA: Diagnosis present

## 2014-03-08 DIAGNOSIS — M109 Gout, unspecified: Secondary | ICD-10-CM | POA: Diagnosis present

## 2014-03-08 DIAGNOSIS — I872 Venous insufficiency (chronic) (peripheral): Secondary | ICD-10-CM | POA: Diagnosis present

## 2014-03-08 DIAGNOSIS — E1151 Type 2 diabetes mellitus with diabetic peripheral angiopathy without gangrene: Secondary | ICD-10-CM | POA: Diagnosis present

## 2014-03-08 DIAGNOSIS — Z823 Family history of stroke: Secondary | ICD-10-CM | POA: Diagnosis not present

## 2014-03-08 DIAGNOSIS — E871 Hypo-osmolality and hyponatremia: Secondary | ICD-10-CM | POA: Diagnosis present

## 2014-03-08 DIAGNOSIS — Z833 Family history of diabetes mellitus: Secondary | ICD-10-CM

## 2014-03-08 DIAGNOSIS — R609 Edema, unspecified: Secondary | ICD-10-CM | POA: Diagnosis present

## 2014-03-08 DIAGNOSIS — E118 Type 2 diabetes mellitus with unspecified complications: Secondary | ICD-10-CM | POA: Insufficient documentation

## 2014-03-08 DIAGNOSIS — Z7401 Bed confinement status: Secondary | ICD-10-CM

## 2014-03-08 DIAGNOSIS — L0390 Cellulitis, unspecified: Secondary | ICD-10-CM | POA: Diagnosis present

## 2014-03-08 DIAGNOSIS — Z794 Long term (current) use of insulin: Secondary | ICD-10-CM | POA: Diagnosis not present

## 2014-03-08 DIAGNOSIS — L03116 Cellulitis of left lower limb: Secondary | ICD-10-CM | POA: Diagnosis not present

## 2014-03-08 DIAGNOSIS — E669 Obesity, unspecified: Secondary | ICD-10-CM | POA: Insufficient documentation

## 2014-03-08 LAB — BASIC METABOLIC PANEL
Anion gap: 3 — ABNORMAL LOW (ref 5–15)
BUN: 22 mg/dL (ref 6–23)
CO2: 28 mmol/L (ref 19–32)
Calcium: 8.9 mg/dL (ref 8.4–10.5)
Chloride: 99 mEq/L (ref 96–112)
Creatinine, Ser: 0.74 mg/dL (ref 0.50–1.10)
GFR calc Af Amer: >90 mL/min (ref >90)
GFR calc non Af Amer: 83 mL/min — ABNORMAL LOW (ref >90)
Glucose, Bld: 181 mg/dL — ABNORMAL HIGH (ref 70–99)
Potassium: 4.1 mmol/L (ref 3.5–5.1)
Sodium: 130 mmol/L — ABNORMAL LOW (ref 135–145)

## 2014-03-08 LAB — CBC
HCT: 40.8 % (ref 36.0–46.0)
Hemoglobin: 13 g/dL (ref 12.0–15.0)
MCH: 26.1 pg (ref 26.0–34.0)
MCHC: 31.9 g/dL (ref 30.0–36.0)
MCV: 81.8 fL (ref 78.0–100.0)
PLATELETS: 268 10*3/uL (ref 150–400)
RBC: 4.99 MIL/uL (ref 3.87–5.11)
RDW: 15 % (ref 11.5–15.5)
WBC: 10.6 10*3/uL — AB (ref 4.0–10.5)

## 2014-03-08 LAB — GLUCOSE, CAPILLARY: Glucose-Capillary: 77 mg/dL (ref 70–99)

## 2014-03-08 MED ORDER — POTASSIUM GLUCONATE 595 MG PO CAPS
ORAL_CAPSULE | ORAL | Status: DC
  Administered 2014-03-09 – 2014-03-11 (×2): via ORAL
  Filled 2014-03-08 (×3): qty 1

## 2014-03-08 MED ORDER — INSULIN ASPART PROT & ASPART (70-30 MIX) 100 UNIT/ML ~~LOC~~ SUSP
50.0000 [IU] | Freq: Every day | SUBCUTANEOUS | Status: DC
  Administered 2014-03-08 – 2014-03-10 (×3): 50 [IU] via SUBCUTANEOUS

## 2014-03-08 MED ORDER — TRAMADOL HCL 50 MG PO TABS
50.0000 mg | ORAL_TABLET | Freq: Two times a day (BID) | ORAL | Status: DC
  Administered 2014-03-08 – 2014-03-12 (×9): 50 mg via ORAL
  Filled 2014-03-08 (×9): qty 1

## 2014-03-08 MED ORDER — TORSEMIDE 20 MG PO TABS
40.0000 mg | ORAL_TABLET | ORAL | Status: DC
  Administered 2014-03-09 – 2014-03-13 (×3): 40 mg via ORAL
  Filled 2014-03-08 (×3): qty 2

## 2014-03-08 MED ORDER — DICLOFENAC SODIUM 50 MG PO TBEC
50.0000 mg | DELAYED_RELEASE_TABLET | Freq: Two times a day (BID) | ORAL | Status: DC
  Administered 2014-03-08 – 2014-03-13 (×10): 50 mg via ORAL
  Filled 2014-03-08 (×12): qty 1

## 2014-03-08 MED ORDER — POTASSIUM GLUCONATE 595 (99 K) MG PO TABS: 595.0000 mg | ORAL_TABLET | ORAL | Status: DC

## 2014-03-08 MED ORDER — INSULIN ASPART 100 UNIT/ML ~~LOC~~ SOLN: 0.0000 [IU] | Freq: Every day | SUBCUTANEOUS | Status: DC

## 2014-03-08 MED ORDER — OXYCODONE HCL 5 MG PO TABS: 5.0000 mg | ORAL_TABLET | ORAL | Status: DC | PRN

## 2014-03-08 MED ORDER — ONDANSETRON HCL 4 MG PO TABS: 4.0000 mg | ORAL_TABLET | Freq: Four times a day (QID) | ORAL | Status: DC | PRN

## 2014-03-08 MED ORDER — VANCOMYCIN HCL IN DEXTROSE 1-5 GM/200ML-% IV SOLN
1000.0000 mg | Freq: Two times a day (BID) | INTRAVENOUS | Status: DC
  Administered 2014-03-09 – 2014-03-11 (×5): 1000 mg via INTRAVENOUS
  Filled 2014-03-08 (×5): qty 200

## 2014-03-08 MED ORDER — INSULIN ASPART 100 UNIT/ML ~~LOC~~ SOLN
0.0000 [IU] | Freq: Three times a day (TID) | SUBCUTANEOUS | Status: DC
Start: 2014-03-09 — End: 2014-03-13
  Administered 2014-03-09: 2 [IU] via SUBCUTANEOUS
  Administered 2014-03-09: 1 [IU] via SUBCUTANEOUS
  Administered 2014-03-10 – 2014-03-11 (×2): 2 [IU] via SUBCUTANEOUS
  Administered 2014-03-12 – 2014-03-13 (×4): 1 [IU] via SUBCUTANEOUS

## 2014-03-08 MED ORDER — ONDANSETRON HCL 4 MG/2ML IJ SOLN: 4.0000 mg | Freq: Four times a day (QID) | INTRAMUSCULAR | Status: DC | PRN

## 2014-03-08 MED ORDER — IRBESARTAN 150 MG PO TABS
150.0000 mg | ORAL_TABLET | Freq: Every day | ORAL | Status: DC
  Administered 2014-03-09 – 2014-03-13 (×5): 150 mg via ORAL
  Filled 2014-03-08 (×5): qty 1

## 2014-03-08 MED ORDER — PIPERACILLIN-TAZOBACTAM 3.375 G IVPB
3.3750 g | Freq: Three times a day (TID) | INTRAVENOUS | Status: AC
  Administered 2014-03-08 – 2014-03-12 (×13): 3.375 g via INTRAVENOUS
  Filled 2014-03-08 (×13): qty 50

## 2014-03-08 MED ORDER — PIPERACILLIN-TAZOBACTAM 3.375 G IVPB 30 MIN
3.3750 g | Freq: Once | INTRAVENOUS | Status: AC
  Administered 2014-03-08: 3.375 g via INTRAVENOUS
  Filled 2014-03-08: qty 50

## 2014-03-08 MED ORDER — ENOXAPARIN SODIUM 40 MG/0.4ML ~~LOC~~ SOLN
40.0000 mg | SUBCUTANEOUS | Status: DC
  Administered 2014-03-08: 40 mg via SUBCUTANEOUS
  Filled 2014-03-08 (×2): qty 0.4

## 2014-03-08 MED ORDER — SODIUM CHLORIDE 0.9 % IV SOLN
INTRAVENOUS | Status: DC
  Administered 2014-03-08 – 2014-03-12 (×5): via INTRAVENOUS

## 2014-03-08 MED ORDER — TRAMADOL HCL 50 MG PO TABS: 50.0000 mg | ORAL_TABLET | Freq: Three times a day (TID) | ORAL | Status: DC | PRN

## 2014-03-08 MED ORDER — VANCOMYCIN HCL 10 G IV SOLR
2500.0000 mg | INTRAVENOUS | Status: DC
  Administered 2014-03-08: 2500 mg via INTRAVENOUS
  Filled 2014-03-08: qty 2500

## 2014-03-08 MED ORDER — INSULIN ASPART 100 UNIT/ML ~~LOC~~ SOLN
0.0000 [IU] | Freq: Three times a day (TID) | SUBCUTANEOUS | Status: DC
  Administered 2014-03-08: 3 [IU] via SUBCUTANEOUS

## 2014-03-08 MED ORDER — SALINE SPRAY 0.65 % NA SOLN: 1.0000 | NASAL | Status: DC | PRN

## 2014-03-08 MED ORDER — INSULIN ASPART PROT & ASPART (70-30 MIX) 100 UNIT/ML ~~LOC~~ SUSP
10.0000 [IU] | Freq: Two times a day (BID) | SUBCUTANEOUS | Status: DC
  Administered 2014-03-09 – 2014-03-13 (×9): 10 [IU] via SUBCUTANEOUS
  Filled 2014-03-08: qty 10

## 2014-03-08 MED ORDER — VANCOMYCIN HCL IN DEXTROSE 1-5 GM/200ML-% IV SOLN
1000.0000 mg | Freq: Once | INTRAVENOUS | Status: DC
  Administered 2014-03-08: 1000 mg via INTRAVENOUS
  Filled 2014-03-08: qty 200

## 2014-03-08 NOTE — Progress Notes (Addendum)
ANTIBIOTIC CONSULT NOTE - INITIAL  Pharmacy Consult for Vancomycin Indication: Cellulitis  Allergies  Allergen Reactions  . Statins Other (See Comments)    Muscle spasms   . Codeine Nausea And Vomiting  . Sulfonamide Derivatives Nausea And Vomiting    Patient Measurements: Height: 5\' 2"  (157.5 cm) Weight: (!) 310 lb (140.615 kg) IBW/kg (Calculated) : 50.1  Vital Signs: Temp: 98 F (36.7 C) (01/13 0910) Temp Source: Oral (01/13 0910) BP: 154/86 mmHg (01/13 0910) Pulse Rate: 77 (01/13 0910) Intake/Output from previous day:   Intake/Output from this shift:    Labs:  Recent Labs  03/08/14 0925  WBC 10.6*  HGB 13.0  PLT 268  CREATININE 0.74   Estimated Creatinine Clearance: 86.6 mL/min (by C-G formula based on Cr of 0.74). No results for input(s): VANCOTROUGH, VANCOPEAK, VANCORANDOM, GENTTROUGH, GENTPEAK, GENTRANDOM, TOBRATROUGH, TOBRAPEAK, TOBRARND, AMIKACINPEAK, AMIKACINTROU, AMIKACIN in the last 72 hours.   Microbiology: No results found for this or any previous visit (from the past 720 hour(s)).  Medical History: Past Medical History  Diagnosis Date  . S/P appy   . Diabetes mellitus   . Hypertension   . Hyperlipidemia   . Cellulitis of left lower extremity     Begin antibiotic 04/12/12  . Gout   . Arthritis   . Incarcerated ventral hernia s/p lap repair w mesh 05/13/2012    Medications:  Scheduled:   Infusions:  . vancomycin    . [START ON 03/09/2014] vancomycin     PRN:   Assessment: 74 yo diabetic female presents with BLE cellulitis. She has had issues with cellulitis for years. Has reportedly been on augmentin ancd keflex per PCP, and has been sent to ED for IV abx and Pharmacy is consulted to dose Vancomycin.  1/13 >> Vancomycin >>  Tmax: Afebrile WBC: 10.6k Renal: SCr 0.74, CrCl 86 ml/min CG, 77 ml/min Normlalized  No cultures order at this time  Goal of Therapy:  Vancomycin trough level 10-15 mcg/ml  Plan:   Vancomycin 2500mg  IV x 1  loading dose  Vancomycin 1g IV q12h Check trough at steady state Follow up renal function & cultures (if ordered)  Peggyann Juba, PharmD, BCPS Pager: [phone] 03/08/2014,10:43 AM   Addendum:   A/P: Md to add Zosyn to vancomycin already ordered for cellulitis. Start Zosyn 3.375g IV q8 (extended interval infusion) for CrCl > 20 ml/min  Adrian Saran, PharmD, BCPS Pager [phone] 03/08/2014 2:17 PM

## 2014-03-08 NOTE — H&P (Signed)
[facility] History and Physical  [name] Bridges LZJ:[id number] DOB: 1940/03/18 DOA: 03/08/2014  Referring physician: ED physician PCP: Florina Ou, MD   Chief Complaint: lower extremity erythema   HPI:  Pt is 74 yo female who presented to [facility] ED with main concern of progressively worsening lower extremity edema and erythema that started few weeks prior to this admission. Pt was treated by her PCP with keflex and Augmentin but her symptoms has not gotten better and she is now having fevers, chills, malaise. She describes pain as throbbing and constant, pressure like, non radiating and with no specific alleviating factors, worse with ambulation. She has noted some drainage as well from both legs. She has been mostly bed bound as a result.   In ED, pt is hemodynamically stable, VSS. Blood work notable for WBC 10.6. Pt started on Vancomycin and [facility] asked to admit for further evaluation.   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   Code Status: Full Family Communication: Pt at bedside Disposition Plan: Admit for further evaluation     Review of Systems:  Constitutional: Negative for diaphoresis.  HENT: Negative for hearing loss, ear pain, nosebleeds, congestion, sore throat, neck pain, tinnitus and ear discharge.   Eyes: Negative for blurred vision, double vision, photophobia, pain, discharge and redness.  Respiratory: Negative for cough, hemoptysis, sputum production, shortness of breath,  wheezing and stridor.   Cardiovascular: Negative for chest pain, palpitations, orthopnea, claudication and leg swelling.  Gastrointestinal: Negative for heartburn, constipation, blood in stool and melena.  Genitourinary: Negative for dysuria, urgency, frequency, hematuria and flank pain.  Musculoskeletal: Negative for joint pain and falls.  Skin: Negative for itching and rash.  Neurological: Negative for dizziness and weakness.  Endo/Heme/Allergies: Negative for environmental allergies and polydipsia. Does not bruise/bleed easily.  Psychiatric/Behavioral: Negative for suicidal ideas. The patient is not nervous/anxious.      Past Medical History  Diagnosis Date  . S/P appy   . Diabetes mellitus   . Hypertension   . Hyperlipidemia   . Cellulitis of left lower extremity     Begin antibiotic 04/12/12  . Gout   . Arthritis   . Incarcerated ventral hernia s/p lap repair w mesh 05/13/2012    Past Surgical History  Procedure Laterality Date  . Vaginal hysterectomy    . Ectopic pregnancy surgery    . Colonoscopy  2003, 12/17/10    2003: small adenoma 2012: small adenoma (6-57mm) and diverticulosis  . Appendectomy    . Tonsillectomy and adenoidectomy    . Ventral hernia repair N/A 05/02/2012    Procedure: LAPAROSCOPIC VENTRAL HERNIA Repair with mesh;  Surgeon: Adin Hector, MD;  Location: [facility];  Service: General;  Laterality: N/A;  Lysis of adhesions,   Umbillectomy    Social History:  reports that she has never smoked. She has never used smokeless tobacco. She reports that she does not drink alcohol or use illicit drugs.  Allergies  Allergen Reactions  . Statins Other (See Comments)  Muscle spasms   . Codeine Nausea And Vomiting  . Sulfonamide Derivatives Nausea And Vomiting    Family History  Problem Relation Age of Onset  . Diabetes Mother   . Stroke Father   . Diabetes Sister   . Diabetes Brother     Prior to Admission medications   Medication Sig Start Date End  Date Taking? Authorizing Provider  cephALEXin (KEFLEX) 500 MG capsule Take 500 mg by mouth 4 (four) times daily. For 10 days 03/03/14  Yes Historical Provider, MD  diclofenac (VOLTAREN) 50 MG EC tablet Take 50 mg by mouth 2 (two) times daily.   Yes Historical Provider, MD  Influenza Vac Split Quad (FLUZONE) 0.25 ML injection Inject 0.25 mLs into the muscle once.   Yes Historical Provider, MD  insulin NPH-regular Human (NOVOLIN 70/30) (70-30) 100 UNIT/ML injection Inject 10-50 Units into the skin 3 (three) times daily. Takes 10 units in the morning and lunch, then 50 units with supper   Yes Historical Provider, MD  potassium gluconate 595 MG TABS Take 595 mg by mouth every other day.    Yes Historical Provider, MD  Red Yeast Rice Extract (RED YEAST RICE PO) Take 120 mg by mouth every other day.   Yes Historical Provider, MD  sodium chloride (OCEAN) 0.65 % SOLN nasal spray Place 1 spray into both nostrils as needed for congestion.   Yes Historical Provider, MD  torsemide (DEMADEX) 20 MG tablet Take 40 mg by mouth every other day.    Yes Historical Provider, MD  traMADol (ULTRAM) 50 MG tablet Take 1 tablet (50 mg total) by mouth every 8 (eight) hours as needed for pain. Patient taking differently: Take 50 mg by mouth 2 (two) times daily as needed for moderate pain.  05/07/12  Yes Elizabeth White, PA-C  valsartan (DIOVAN) 160 MG tablet Take 160 mg by mouth daily with breakfast.    Yes Historical Provider, MD  ketorolac (TORADOL) 10 MG tablet Take 1 tablet (10 mg total) by mouth every 8 (eight) hours as needed for pain. Patient not taking: Reported on 03/08/2014 05/07/12   Eulogio Bear, PA-C  polyethylene glycol [facility] / Floria Raveling) packet Take 17 g by mouth daily. Patient not taking: Reported on 03/08/2014 05/07/12   Eulogio Bear, PA-C    Physical Exam: Filed Vitals:   03/08/14 0909 03/08/14 0910  BP: 154/86 154/86  Pulse: 78 77  Temp: 98 F (36.7 C) 98 F (36.7 C)  TempSrc: Oral Oral  Resp: 20  20  Height:  5\' 2"  (1.575 m)  Weight:  140.615 kg (310 lb)  SpO2: 99% 98%    Physical Exam  Constitutional: Appears well-developed and well-nourished. No distress.  HENT: Normocephalic. External right and left ear normal. Oropharynx is clear and moist.  Eyes: Conjunctivae and EOM are normal. PERRLA, no scleral icterus.  Neck: Normal ROM. Neck supple. No JVD. No tracheal deviation. No thyromegaly.  CVS: RRR, S1/S2 +, no murmurs, no gallops, no carotid bruit.  Pulmonary: Effort and breath sounds normal, no stridor, rhonchi, wheezes, rales.  Abdominal: Soft. BS +,  no distension, tenderness, rebound or guarding.  Musculoskeletal: Normal range of motion. LE swelling and erythema from ankles to mid shin area with few open wounds and purulent drainage  Lymphadenopathy: No lymphadenopathy noted, cervical, inguinal. Neuro: Alert. Normal reflexes, muscle tone coordination. No cranial nerve deficit. Skin: Skin is warm and dry. No rash noted. Not diaphoretic. No erythema. No pallor.  Psychiatric: Normal mood and affect. Behavior, judgment, thought content  normal.   Labs on Admission:  Basic Metabolic Panel:  Recent Labs Lab 03/08/14 0925  NA 130*  K 4.1  CL 99  CO2 28  GLUCOSE 181*  BUN 22  CREATININE 0.74  CALCIUM 8.9   CBC:  Recent Labs Lab 03/08/14 0925  WBC 10.6*  HGB 13.0  HCT 40.8  MCV 81.8  PLT 268    EKG: Normal sinus rhythm, no ST/T wave changes  Faye Ramsay, MD  [facility] Pager [phone]  If 7PM-7AM, please contact night-coverage [email or web address] Password [facility] 03/08/2014, 11:42 AM

## 2014-03-08 NOTE — Progress Notes (Signed)
Patient is requesting 50mg  Diclofenac BID, Red yeast rice 120mg  every other day on even days, Tramadol 50mg  BID, and  DC Oxy order. All medication orders were given except for the red yeast rice. MD asked about order for switching patient from regular diet to carb mod diet and about placing an order for CBGs. MD also placed patient on sensitive sliding scale and gave order for WOCN to look at the wounds on her legs.

## 2014-03-08 NOTE — Consult Note (Signed)
WOC wound consult note Reason for Consult:Patient known to me from the community; previously employed at [employer], now retired.  Presents with bilateral LE edema, erythema and several wounds on each LE (both full and partial thickness) Wound type: venous insufficiency vs lymphedema and infection Pressure Ulcer POA: No Measurement:Right LE:  Distal measures 2cm x 4cm x 0.2cm with crusted and dried serum (scab), medial thigh with fluid filled partial thickness blister measuring 1.5cm x 2.5cm oval.  Left LE: lateral LE:  1.5cm x 1.5cm fluid (serum) filled blister, medial thigh:  2.5cm x 3cm x 0.2cm ruptured blister with red, moistt base. Wound bed:As described above Drainage (amount, consistency, odor)  Scant serous; wounds have been open to air since admission. Periwound:intact, red, warm Dressing procedure/placement/frequency:Patient with chronic venous insufficiency and this is a recurring problem (cellulitis).  Systemic antibiotic coverage has started and patient is fearful of anything "sticking to the legs". She used a bed cradle at home to keep linen off of her legs.   Extended visit to explain the antimicrobial benefits of DermaTherapy linens and also to reassure her that the topical dressings ordered by this writer are non-adherent (xeroform),  act as an astringent and also have an antimicrobial property.  She is in agreement to try the recommended interventions. [place] nursing team will not follow, but will remain available to this patient, the nursing and medical teams.  Please re-consult if needed. Thanks, Maudie Flakes, MSN, RN, [place], [place], [place] [phone])

## 2014-03-08 NOTE — ED Provider Notes (Signed)
CSN: [id number]     Arrival date & time 03/08/14  0900 History   First MD Initiated Contact with Patient 03/08/14 0940     Chief Complaint  Patient presents with  . Wound Infection     (Consider location/radiation/quality/duration/timing/severity/associated sxs/prior Treatment) The history is provided by the patient.   patient with cellulitis of her bilateral lower extremity. Has been managed by her primary care doctor. Has a history of same. Has been on Augmentin and Keflex. Continues to worsen. She's had some chills. States she was told that she needs to come in to the ER for IV antibiotics. She is diabetic. Sugars have been between 80 and 130. She has had issues with cellulitis for years. There is been increased drainage.  Past Medical History  Diagnosis Date  . S/P appy   . Diabetes mellitus   . Hypertension   . Hyperlipidemia   . Cellulitis of left lower extremity     Begin antibiotic 04/12/12  . Gout   . Arthritis   . Incarcerated ventral hernia s/p lap repair w mesh 05/13/2012   Past Surgical History  Procedure Laterality Date  . Vaginal hysterectomy    . Ectopic pregnancy surgery    . Colonoscopy  2003, 12/17/10    2003: small adenoma 2012: small adenoma (6-19mm) and diverticulosis  . Appendectomy    . Tonsillectomy and adenoidectomy    . Ventral hernia repair N/A 05/02/2012    Procedure: LAPAROSCOPIC VENTRAL HERNIA Repair with mesh;  Surgeon: Adin Hector, MD;  Location: [facility];  Service: General;  Laterality: N/A;  Lysis of adhesions,   Umbillectomy   Family History  Problem Relation Age of Onset  . Diabetes Mother   . Stroke Father   . Diabetes Sister   . Diabetes Brother    History  Substance Use Topics  . Smoking status: Never Smoker   . Smokeless tobacco: Never Used  . Alcohol Use: No   OB History    No data available     Review of Systems  Constitutional: Positive for chills. Negative for activity change and appetite change.  Eyes: Negative for  pain.  Respiratory: Negative for chest tightness and shortness of breath.   Cardiovascular: Negative for chest pain and leg swelling.  Gastrointestinal: Negative for nausea, vomiting, abdominal pain and diarrhea.  Genitourinary: Negative for flank pain.  Musculoskeletal: Negative for back pain and neck stiffness.  Skin: Positive for color change and wound. Negative for rash.  Neurological: Negative for weakness, numbness and headaches.  Psychiatric/Behavioral: Negative for behavioral problems.      Allergies  Statins; Codeine; and Sulfonamide derivatives  Home Medications   Prior to Admission medications   Medication Sig Start Date End Date Taking? Authorizing Provider  cephALEXin (KEFLEX) 500 MG capsule Take 500 mg by mouth 4 (four) times daily. For 10 days 03/03/14  Yes Historical Provider, MD  diclofenac (VOLTAREN) 50 MG EC tablet Take 50 mg by mouth 2 (two) times daily.   Yes Historical Provider, MD  Influenza Vac Split Quad (FLUZONE) 0.25 ML injection Inject 0.25 mLs into the muscle once.   Yes Historical Provider, MD  insulin NPH-regular Human (NOVOLIN 70/30) (70-30) 100 UNIT/ML injection Inject 10-50 Units into the skin 3 (three) times daily. Takes 10 units in the morning and lunch, then 50 units with supper   Yes Historical Provider, MD  potassium gluconate 595 MG TABS Take 595 mg by mouth every other day.    Yes Historical Provider, MD  Red  Yeast Rice Extract (RED YEAST RICE PO) Take 120 mg by mouth every other day.   Yes Historical Provider, MD  sodium chloride (OCEAN) 0.65 % SOLN nasal spray Place 1 spray into both nostrils as needed for congestion.   Yes Historical Provider, MD  torsemide (DEMADEX) 20 MG tablet Take 40 mg by mouth every other day.    Yes Historical Provider, MD  traMADol (ULTRAM) 50 MG tablet Take 1 tablet (50 mg total) by mouth every 8 (eight) hours as needed for pain. Patient taking differently: Take 50 mg by mouth 2 (two) times daily as needed for moderate  pain.  05/07/12  Yes Elizabeth White, PA-C  valsartan (DIOVAN) 160 MG tablet Take 160 mg by mouth daily with breakfast.    Yes Historical Provider, MD  ketorolac (TORADOL) 10 MG tablet Take 1 tablet (10 mg total) by mouth every 8 (eight) hours as needed for pain. Patient not taking: Reported on 03/08/2014 05/07/12   Eulogio Bear, PA-C  polyethylene glycol [facility] / Floria Raveling) packet Take 17 g by mouth daily. Patient not taking: Reported on 03/08/2014 05/07/12   Eulogio Bear, PA-C   BP 154/86 mmHg  Pulse 77  Temp(Src) 98 F (36.7 C) (Oral)  Resp 20  Ht 5\' 2"  (1.575 m)  Wt 310 lb (140.615 kg)  BMI 56.69 kg/m2  SpO2 98% Physical Exam  Constitutional: She appears well-developed and well-nourished.  HENT:  Head: Normocephalic.  Cardiovascular: Normal rate.   Pulmonary/Chest: Effort normal and breath sounds normal.  Abdominal: Soft. There is no tenderness.  Musculoskeletal: She exhibits tenderness.  Neurological: She is alert.  Skin:  Apparently cellulitic areas to medial lower areas of bilateral thighs. No fluctuance. There is some induration. There is a central ulcer on the left side and some vesicular changes on the right side. Bilateral lower legs have some chronic venous changes with erythema and some drainage also. No fluctuance.    ED Course  Procedures (including critical care time) Labs Review Labs Reviewed  CBC - Abnormal; Notable for the following:    WBC 10.6 (*)    All other components within normal limits  BASIC METABOLIC PANEL - Abnormal; Notable for the following:    Sodium 130 (*)    Glucose, Bld 181 (*)    GFR calc non Af Amer 83 (*)    Anion gap 3 (*)    All other components within normal limits    Imaging Review No results found.   EKG Interpretation None      MDM   Final diagnoses:  Cellulitis of lower extremity, unspecified laterality    Patient with cellulitis of bilateral lower extremities. Has been on Keflex and Augmentin already.  Continued worsening. Sent in for admission. Admission seems reasonable with the failure of outpatient antibiotics. Will admit to internal medicine.    Jasper Riling. Alvino Chapel, MD 03/08/14 1208

## 2014-03-08 NOTE — ED Notes (Addendum)
Pt presents with open wounds to lower legs an thighs, minimal drainage noted but legs are reddened and swollen. Pain to legs as well.

## 2014-03-09 DIAGNOSIS — I10 Essential (primary) hypertension: Secondary | ICD-10-CM

## 2014-03-09 DIAGNOSIS — E871 Hypo-osmolality and hyponatremia: Secondary | ICD-10-CM

## 2014-03-09 LAB — CBC
HCT: 34.9 % — ABNORMAL LOW (ref 36.0–46.0)
Hemoglobin: 11.1 g/dL — ABNORMAL LOW (ref 12.0–15.0)
MCH: 26 pg (ref 26.0–34.0)
MCHC: 31.8 g/dL (ref 30.0–36.0)
MCV: 81.7 fL (ref 78.0–100.0)
Platelets: 236 10*3/uL (ref 150–400)
RBC: 4.27 MIL/uL (ref 3.87–5.11)
RDW: 15.2 % (ref 11.5–15.5)
WBC: 7.9 10*3/uL (ref 4.0–10.5)

## 2014-03-09 LAB — BASIC METABOLIC PANEL
Anion gap: 9 (ref 5–15)
BUN: 24 mg/dL — ABNORMAL HIGH (ref 6–23)
CO2: 27 mmol/L (ref 19–32)
Calcium: 8.8 mg/dL (ref 8.4–10.5)
Chloride: 104 mEq/L (ref 96–112)
Creatinine, Ser: 0.73 mg/dL (ref 0.50–1.10)
GFR calc non Af Amer: 83 mL/min — ABNORMAL LOW (ref >90)
Glucose, Bld: 108 mg/dL — ABNORMAL HIGH (ref 70–99)
Potassium: 4.2 mmol/L (ref 3.5–5.1)
Sodium: 140 mmol/L (ref 135–145)

## 2014-03-09 LAB — GLUCOSE, CAPILLARY
GLUCOSE-CAPILLARY: 144 mg/dL — AB (ref 70–99)
GLUCOSE-CAPILLARY: 130 mg/dL — AB (ref 70–99)
Glucose-Capillary: 156 mg/dL — ABNORMAL HIGH (ref 70–99)

## 2014-03-09 MED ORDER — ENOXAPARIN SODIUM 80 MG/0.8ML ~~LOC~~ SOLN
70.0000 mg | SUBCUTANEOUS | Status: DC
  Administered 2014-03-09 – 2014-03-12 (×4): 70 mg via SUBCUTANEOUS
  Filled 2014-03-09 (×5): qty 0.8

## 2014-03-09 NOTE — Progress Notes (Signed)
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 -low calorie diet and weight loss importance discussed with patient  DVT: lovenox  Code Status: Full Family Communication: no family at bedside Disposition Plan: to be determine    Consultants:  WOC  Procedures:  None   Antibiotics:  Vancomycin 1/13  Zosyn 1/13  HPI/Subjective: No fever, feeling ok; pain around wounds.   Objective: Filed Vitals:   03/09/14 1300  BP: 121/64  Pulse: 65  Temp: 98 F (36.7 C)  Resp: 16    Intake/Output Summary (Last 24 hours) at 03/09/14 1751 Last data filed at 03/09/14 1419  Gross per 24 hour  Intake   1290 ml  Output      0 ml  Net   1290 ml   Filed Weights   03/08/14 0910 03/08/14 1326 03/09/14 0528  Weight: 140.615 kg (310 lb) 140.615 kg (310 lb) 142.8 kg (314 lb 13.1 oz)    Exam:   General:  Afebrile, denies N/V/D; no CP or SOB.  Cardiovascular: S1 and S2, no rubs or gallops  Respiratory: CTA bilaterally  Abdomen: soft, NT, ND, positive BS  Musculoskeletal: bilateral cellulitis/erythema and open wound on distal aspect of her legs (there is not involvement of her feet). Positive drainage out of wounds, serosanguineous (R > L); no odor  Data Reviewed: Basic Metabolic Panel:  Recent Labs Lab 03/08/14 0925 03/09/14 0510  NA 130*  140  K 4.1 4.2  CL 99 104  CO2 28 27  GLUCOSE 181* 108*  BUN 22 24*  CREATININE 0.74 0.73  CALCIUM 8.9 8.8   CBC:  Recent Labs Lab 03/08/14 0925 03/09/14 0510  WBC 10.6* 7.9  HGB 13.0 11.1*  HCT 40.8 34.9*  MCV 81.8 81.7  PLT 268 236   CBG:  Recent Labs Lab 03/08/14 2127 03/09/14 1205 03/09/14 1721  GLUCAP 77 144* 156*    Recent Results (from the past 240 hour(s))  Culture, blood (routine x 2)     Status: None (Preliminary result)   Collection Time: 03/08/14  2:30 PM  Result Value Ref Range Status   Specimen Description BLOOD RIGHT HAND  Final   Special Requests BOTTLES DRAWN AEROBIC AND ANAEROBIC 10CC  Final   Culture   Final           BLOOD CULTURE RECEIVED NO GROWTH TO DATE CULTURE WILL BE HELD FOR 5 DAYS BEFORE ISSUING A FINAL NEGATIVE REPORT Performed at [facility]    Report Status PENDING  Incomplete  Culture, blood (routine x 2)     Status: None (Preliminary result)   Collection Time: 03/08/14  2:31 PM  Result Value Ref Range Status   Specimen Description BLOOD LEFT ARM  Final   Special Requests BOTTLES DRAWN AEROBIC AND ANAEROBIC 10CC  Final   Culture   Final           BLOOD CULTURE RECEIVED  NO GROWTH TO DATE CULTURE WILL BE HELD FOR 5 DAYS BEFORE ISSUING A FINAL NEGATIVE REPORT Performed at [facility]    Report Status PENDING  Incomplete     Studies: No results found.  Scheduled Meds: . diclofenac  50 mg Oral BID AC  . enoxaparin (LOVENOX) injection  70 mg Subcutaneous Q24H  . insulin aspart  0-9 Units Subcutaneous TID WC  . insulin aspart protamine- aspart  10 Units Subcutaneous BID WC  . insulin aspart protamine- aspart  50 Units Subcutaneous Q supper  . irbesartan  150 mg Oral Daily  . piperacillin-tazobactam (ZOSYN)  IV  3.375 g Intravenous Q8H  . Potassium Gluconate   Oral QODAY  . torsemide  40 mg Oral QODAY  . traMADol  50 mg Oral Q12H  . vancomycin  1,000 mg Intravenous Q12H   Continuous Infusions: . sodium  chloride 100 mL/hr at 03/09/14 1647    Active Problems:   Cellulitis of left lower extremity   Cellulitis    Time spent: 30 minutes    Barton Dubois  [facility] Pager [phone]. If 7PM-7AM, please contact night-coverage at [email or web address], password [facility] 03/09/2014, 5:51 PM  LOS: 1 day

## 2014-03-09 NOTE — Clinical Documentation Improvement (Addendum)
Supporting Information:  Abnormal Lab and/or Testing Results: Sodium: 03/08/14: 130.  Treatment provided: 03/08/14: 0.9% NaCl @ 100 ml/hr.   Possible Clinical Conditions: __X__Hyponatremia > Other  > Unable to determine.    Thank Sherian Maroon Documentation Specialist [phone] [email or web address]   Hyponatremia will be added to progress note and discharge  Barton Dubois [phone]

## 2014-03-10 DIAGNOSIS — L03119 Cellulitis of unspecified part of limb: Secondary | ICD-10-CM

## 2014-03-10 DIAGNOSIS — E871 Hypo-osmolality and hyponatremia: Secondary | ICD-10-CM | POA: Insufficient documentation

## 2014-03-10 DIAGNOSIS — L02419 Cutaneous abscess of limb, unspecified: Secondary | ICD-10-CM

## 2014-03-10 LAB — CBC
HEMATOCRIT: 36.1 % (ref 36.0–46.0)
HEMOGLOBIN: 11.5 g/dL — AB (ref 12.0–15.0)
MCH: 26 pg (ref 26.0–34.0)
MCHC: 31.9 g/dL (ref 30.0–36.0)
MCV: 81.7 fL (ref 78.0–100.0)
Platelets: 220 10*3/uL (ref 150–400)
RBC: 4.42 MIL/uL (ref 3.87–5.11)
RDW: 15.2 % (ref 11.5–15.5)
WBC: 8.1 10*3/uL (ref 4.0–10.5)

## 2014-03-10 LAB — GLUCOSE, CAPILLARY
GLUCOSE-CAPILLARY: 184 mg/dL — AB (ref 70–99)
GLUCOSE-CAPILLARY: 88 mg/dL (ref 70–99)
GLUCOSE-CAPILLARY: 70 mg/dL (ref 70–99)
Glucose-Capillary: 103 mg/dL — ABNORMAL HIGH (ref 70–99)
Glucose-Capillary: 109 mg/dL — ABNORMAL HIGH (ref 70–99)
Glucose-Capillary: 115 mg/dL — ABNORMAL HIGH (ref 70–99)
Glucose-Capillary: 152 mg/dL — ABNORMAL HIGH (ref 70–99)
Glucose-Capillary: 205 mg/dL — ABNORMAL HIGH (ref 70–99)
Glucose-Capillary: 48 mg/dL — ABNORMAL LOW (ref 70–99)

## 2014-03-10 LAB — HEMOGLOBIN A1C
Hgb A1c MFr Bld: 7.2 % — ABNORMAL HIGH (ref <5.7)
Mean Plasma Glucose: 160 mg/dL — ABNORMAL HIGH (ref <117)

## 2014-03-10 LAB — BASIC METABOLIC PANEL
Anion gap: 8 (ref 5–15)
BUN: 24 mg/dL — ABNORMAL HIGH (ref 6–23)
CALCIUM: 8.8 mg/dL (ref 8.4–10.5)
CO2: 26 mmol/L (ref 19–32)
Chloride: 105 mEq/L (ref 96–112)
Creatinine, Ser: 0.79 mg/dL (ref 0.50–1.10)
GFR calc Af Amer: >90 mL/min (ref >90)
GFR calc non Af Amer: 81 mL/min — ABNORMAL LOW (ref >90)
GLUCOSE: 110 mg/dL — AB (ref 70–99)
Potassium: 4 mmol/L (ref 3.5–5.1)
Sodium: 139 mmol/L (ref 135–145)

## 2014-03-10 MED ORDER — DIPHENHYDRAMINE-ZINC ACETATE 2-0.1 % EX CREA
TOPICAL_CREAM | Freq: Three times a day (TID) | CUTANEOUS | Status: DC | PRN
  Administered 2014-03-10: 21:00:00 via TOPICAL
  Filled 2014-03-10: qty 28

## 2014-03-10 MED FILL — Potassium Gluconate Tab 595 MG (99 MG Equiv K): ORAL | Qty: 1 | Status: AC

## 2014-03-10 NOTE — Progress Notes (Signed)
ANTIBIOTIC CONSULT NOTE  Pharmacy Consult for Vancomycin Indication: Cellulitis  Allergies  Allergen Reactions  . Statins Other (See Comments)    Muscle spasms   . Codeine Nausea And Vomiting  . Sulfonamide Derivatives Nausea And Vomiting    Patient Measurements: Height: 5\' 2"  (157.5 cm) Weight: (!) 313 lb 11.2 oz (142.293 kg) IBW/kg (Calculated) : 50.1  Assessment: 74 yo diabetic and obese female presents 1/13 with BLE cellulitis. She has had issues with cellulitis for years. Has reportedly been on augmentin ancd keflex per PCP, and was sent to ED for IV abx. Pharmacy is consulted to dose vancomycin and Zosyn for cellulitis.  Antiinfectives  1/13 >> Vancomycin >> 1/13 >> Zosyn >>  Labs / vitals Tmax: remains afebrile WBC: now remains WNL Renal: SCr 0.79- stable, CrCl 86CG, 77N  Microbiology 1/13 blood x2: ngtd  Goal of Therapy:  Vancomycin trough level 10-15 mcg/ml  Plan:  - continue Zosyn 3.375G IV q8h, each dose to be infused over 4 hours - continue vancomycin 1g IV q12h - check vancomycin trough tonight at 2300 prior to 5th 1g q12h dose - follow-up clinical course, culture results, renal function - follow-up antibiotic de-escalation and length of therapy  Thank you for the consult.  Currie Paris, PharmD, BCPS Pager: [phone] Pharmacy: [phone] 03/10/2014 2:07 PM

## 2014-03-10 NOTE — Progress Notes (Signed)
Patient's blood sugar noted to be 48.  Carb's administered and blood sugar increased to 103.  Will continue to monitor.

## 2014-03-10 NOTE — Progress Notes (Signed)
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 -low calorie diet and weight loss importance discussed with patient  DVT: lovenox  Code Status: Full Family Communication: no family at bedside Disposition Plan: to be determine    Consultants:  WOC  Procedures:  None   Antibiotics:  Vancomycin 1/13  Zosyn 1/13  HPI/Subjective: No fever, feeling ok; pain around wounds continue (R > L); there is some improvement in erythema especially of her left leg.  Objective: Filed Vitals:   03/10/14 1547  BP: 105/48  Pulse: 72  Temp: 98 F (36.7 C)  Resp: 16    Intake/Output Summary (Last 24 hours) at 03/10/14 1639 Last data filed at 03/10/14 1631  Gross per 24 hour  Intake   2350 ml  Output      0 ml  Net   2350 ml   Filed Weights   03/08/14 1326 03/09/14 0528 03/10/14 0535  Weight: 140.615 kg (310 lb) 142.8 kg (314 lb 13.1 oz) 142.293 kg (313 lb 11.2 oz)    Exam:   General:  Afebrile, denies N/V/D; no CP or SOB.  Cardiovascular: S1 and S2, no rubs or gallops  Respiratory: CTA bilaterally  Abdomen: soft, NT, ND, positive BS  Musculoskeletal: bilateral cellulitis/erythema and open wound on distal aspect of her legs (there is not involvement of her feet). Positive drainage out  of wounds, serosanguineous (R > L); no odor. Erythema is improving (L > R). Patient complaining of pain especially on her right leg.  Data Reviewed: Basic Metabolic Panel:  Recent Labs Lab 03/08/14 0925 03/09/14 0510 03/10/14 0510  NA 130* 140 139  K 4.1 4.2 4.0  CL 99 104 105  CO2 28 27 26   GLUCOSE 181* 108* 110*  BUN 22 24* 24*  CREATININE 0.74 0.73 0.79  CALCIUM 8.9 8.8 8.8   CBC:  Recent Labs Lab 03/08/14 0925 03/09/14 0510 03/10/14 0510  WBC 10.6* 7.9 8.1  HGB 13.0 11.1* 11.5*  HCT 40.8 34.9* 36.1  MCV 81.8 81.7 81.7  PLT 268 236 220   CBG:  Recent Labs Lab 03/09/14 2105 03/10/14 0016 03/10/14 0103 03/10/14 0712 03/10/14 1211  GLUCAP 130* 48* 103* 109* 184*    Recent Results (from the past 240 hour(s))  Culture, blood (routine x 2)     Status: None (Preliminary result)   Collection Time: 03/08/14  2:30 PM  Result Value Ref Range Status   Specimen Description BLOOD RIGHT HAND  Final   Special Requests BOTTLES DRAWN AEROBIC AND ANAEROBIC 10CC  Final   Culture   Final           BLOOD CULTURE RECEIVED NO GROWTH TO DATE CULTURE WILL BE HELD FOR 5 DAYS BEFORE ISSUING A FINAL NEGATIVE REPORT Performed at [facility]    Report Status PENDING  Incomplete  Culture, blood (routine x 2)  Status: None (Preliminary result)   Collection Time: 03/08/14  2:31 PM  Result Value Ref Range Status   Specimen Description BLOOD LEFT ARM  Final   Special Requests BOTTLES DRAWN AEROBIC AND ANAEROBIC 10CC  Final   Culture   Final           BLOOD CULTURE RECEIVED NO GROWTH TO DATE CULTURE WILL BE HELD FOR 5 DAYS BEFORE ISSUING A FINAL NEGATIVE REPORT Performed at [facility]    Report Status PENDING  Incomplete     Studies: No results found.  Scheduled Meds: . diclofenac  50 mg Oral BID AC  . enoxaparin (LOVENOX) injection  70 mg Subcutaneous Q24H  . insulin aspart  0-9 Units Subcutaneous TID WC  . insulin aspart protamine- aspart  10 Units  Subcutaneous BID WC  . insulin aspart protamine- aspart  50 Units Subcutaneous Q supper  . irbesartan  150 mg Oral Daily  . piperacillin-tazobactam (ZOSYN)  IV  3.375 g Intravenous Q8H  . Potassium Gluconate   Oral QODAY  . torsemide  40 mg Oral QODAY  . traMADol  50 mg Oral Q12H  . vancomycin  1,000 mg Intravenous Q12H   Continuous Infusions: . sodium chloride 50 mL/hr at 03/10/14 1020    Active Problems:   Cellulitis of left lower extremity   Cellulitis    Time spent: 30 minutes    Barton Dubois  [facility] Pager [phone]. If 7PM-7AM, please contact night-coverage at [email or web address], password [facility] 03/10/2014, 4:39 PM  LOS: 2 days

## 2014-03-11 DIAGNOSIS — E1169 Type 2 diabetes mellitus with other specified complication: Secondary | ICD-10-CM

## 2014-03-11 DIAGNOSIS — L03115 Cellulitis of right lower limb: Secondary | ICD-10-CM

## 2014-03-11 LAB — GLUCOSE, CAPILLARY
GLUCOSE-CAPILLARY: 111 mg/dL — AB (ref 70–99)
GLUCOSE-CAPILLARY: 133 mg/dL — AB (ref 70–99)
Glucose-Capillary: 77 mg/dL (ref 70–99)
Glucose-Capillary: 113 mg/dL — ABNORMAL HIGH (ref 70–99)
Glucose-Capillary: 162 mg/dL — ABNORMAL HIGH (ref 70–99)

## 2014-03-11 LAB — VANCOMYCIN, TROUGH: Vancomycin Tr: 17.3 ug/mL (ref 10.0–20.0)

## 2014-03-11 MED ORDER — INSULIN ASPART PROT & ASPART (70-30 MIX) 100 UNIT/ML ~~LOC~~ SUSP
35.0000 [IU] | Freq: Every day | SUBCUTANEOUS | Status: DC
Start: 2014-03-12 — End: 2014-03-13
  Administered 2014-03-11 – 2014-03-12 (×2): 35 [IU] via SUBCUTANEOUS

## 2014-03-11 MED ORDER — VANCOMYCIN HCL IN DEXTROSE 750-5 MG/150ML-% IV SOLN
750.0000 mg | Freq: Two times a day (BID) | INTRAVENOUS | Status: AC
  Administered 2014-03-11 – 2014-03-13 (×4): 750 mg via INTRAVENOUS
  Filled 2014-03-11 (×4): qty 150

## 2014-03-11 NOTE — Progress Notes (Signed)
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 -A1C  7.2  HTN -continue home medical regimen  -BP stable  Hyponatremia: due to use of demadex -improved/resolved with IVF's -will monitor  Obesity, morbid: Body mass index is 58.53 kg/(m^2). -low calorie diet and weight loss importance discussed with patient  DVT: lovenox  Code Status: Full Family Communication: no family at bedside Disposition Plan: to be determine    Consultants:  WOC  Procedures:  None   Antibiotics:  Vancomycin 1/13  Zosyn 1/13  HPI/Subjective: No fever, feeling ok. Erythema and pain are improving. Mild multiple episodes of hypoglycemia appreciated especially at night (despite good PO intake and not skipping meals)  Objective: Filed Vitals:   03/11/14 1340  BP: 126/64  Pulse: 71  Temp: 97.6 F (36.4 C)  Resp: 18    Intake/Output Summary (Last 24 hours) at 03/11/14 2123 Last data filed at 03/11/14 1833  Gross per 24 hour  Intake   1920 ml  Output      0 ml  Net   1920 ml   Filed Weights   03/09/14 0528 03/10/14 0535 03/11/14 0600  Weight: 142.8 kg (314 lb 13.1 oz) 142.293 kg (313 lb 11.2 oz) 145.2 kg (320 lb 1.7 oz)    Exam:   General:  Afebrile, denies N/V/D; no CP or SOB.  Cardiovascular: S1 and S2, no rubs or gallops  Respiratory: CTA bilaterally  Abdomen: soft, NT, ND, positive BS  Musculoskeletal: bilateral  cellulitis/erythema and open wound on distal aspect of her legs (there is not involvement of her feet). Positive drainage out of wounds, serosanguineous (R > L); no odor. Erythema continue improving (L > R). Patient reports pain is better   Data Reviewed: Basic Metabolic Panel:  Recent Labs Lab 03/08/14 0925 03/09/14 0510 03/10/14 0510  NA 130* 140 139  K 4.1 4.2 4.0  CL 99 104 105  CO2 28 27 26   GLUCOSE 181* 108* 110*  BUN 22 24* 24*  CREATININE 0.74 0.73 0.79  CALCIUM 8.9 8.8 8.8   CBC:  Recent Labs Lab 03/08/14 0925 03/09/14 0510 03/10/14 0510  WBC 10.6* 7.9 8.1  HGB 13.0 11.1* 11.5*  HCT 40.8 34.9* 36.1  MCV 81.8 81.7 81.7  PLT 268 236 220   CBG:  Recent Labs Lab 03/10/14 2158 03/11/14 0207 03/11/14 0711 03/11/14 1127 03/11/14 1653  GLUCAP 70 77 113* 162* 111*    Recent Results (from the past 240 hour(s))  Culture, blood (routine x 2)     Status: None (Preliminary result)   Collection Time: 03/08/14  2:30 PM  Result Value Ref Range Status   Specimen Description BLOOD RIGHT HAND  Final   Special Requests BOTTLES DRAWN AEROBIC AND ANAEROBIC 10CC  Final   Culture   Final           BLOOD CULTURE RECEIVED NO GROWTH TO DATE CULTURE WILL BE HELD FOR 5 DAYS BEFORE ISSUING A FINAL NEGATIVE REPORT Performed at  [facility]    Report Status PENDING  Incomplete  Culture, blood (routine x 2)     Status: None (Preliminary result)   Collection Time: 03/08/14  2:31 PM  Result Value Ref Range Status   Specimen Description BLOOD LEFT ARM  Final   Special Requests BOTTLES DRAWN AEROBIC AND ANAEROBIC 10CC  Final   Culture   Final           BLOOD CULTURE RECEIVED NO GROWTH TO DATE CULTURE WILL BE HELD FOR 5 DAYS BEFORE ISSUING A FINAL NEGATIVE REPORT Performed at [facility]    Report Status PENDING  Incomplete     Studies: No results found.  Scheduled Meds: . diclofenac  50 mg Oral BID AC  . enoxaparin (LOVENOX) injection  70 mg Subcutaneous  Q24H  . insulin aspart  0-9 Units Subcutaneous TID WC  . insulin aspart protamine- aspart  10 Units Subcutaneous BID WC  . [START ON 03/12/2014] insulin aspart protamine- aspart  35 Units Subcutaneous Q supper  . irbesartan  150 mg Oral Daily  . piperacillin-tazobactam (ZOSYN)  IV  3.375 g Intravenous Q8H  . Potassium Gluconate   Oral QODAY  . torsemide  40 mg Oral QODAY  . traMADol  50 mg Oral Q12H  . vancomycin  750 mg Intravenous Q12H   Continuous Infusions: . sodium chloride 50 mL/hr at 03/11/14 3154    Active Problems:   Cellulitis of left lower extremity   Cellulitis   Morbid obesity   Hyponatremia    Time spent: 30 minutes    Barton Dubois  [facility] Pager [phone]. If 7PM-7AM, please contact night-coverage at [email or web address], password [facility] 03/11/2014, 9:23 PM  LOS: 3 days

## 2014-03-11 NOTE — Progress Notes (Signed)
Rx Brief Antibiotic note:  IV Vancomycin  Assessement:  VT=17.3 mg/L  Above goal for cellulitis (10-15 mg/L)  Tmax=98  SCr stable  Plan:  Decrease Vancomycin to 750mg  IV q12h  F/U SCr/levels/cultures  Dorrene German 03/11/2014 1:51 AM

## 2014-03-11 NOTE — Plan of Care (Signed)
Problem: Phase II Progression Outcomes Goal: Progress activity as tolerated unless otherwise ordered Outcome: Completed/Met Date Met:  03/11/14 Patient ambulating per self in room.

## 2014-03-12 LAB — BASIC METABOLIC PANEL
ANION GAP: 11 (ref 5–15)
BUN: 20 mg/dL (ref 6–23)
CO2: 27 mmol/L (ref 19–32)
Calcium: 9.1 mg/dL (ref 8.4–10.5)
Chloride: 103 mEq/L (ref 96–112)
Creatinine, Ser: 0.89 mg/dL (ref 0.50–1.10)
GFR calc non Af Amer: 63 mL/min — ABNORMAL LOW (ref >90)
GFR, EST AFRICAN AMERICAN: 73 mL/min — AB (ref >90)
GLUCOSE: 140 mg/dL — AB (ref 70–99)
Potassium: 4.1 mmol/L (ref 3.5–5.1)
Sodium: 141 mmol/L (ref 135–145)

## 2014-03-12 LAB — GLUCOSE, CAPILLARY
GLUCOSE-CAPILLARY: 128 mg/dL — AB (ref 70–99)
GLUCOSE-CAPILLARY: 127 mg/dL — AB (ref 70–99)
Glucose-Capillary: 143 mg/dL — ABNORMAL HIGH (ref 70–99)
Glucose-Capillary: 140 mg/dL — ABNORMAL HIGH (ref 70–99)
Glucose-Capillary: 79 mg/dL (ref 70–99)

## 2014-03-12 MED ORDER — SACCHAROMYCES BOULARDII 250 MG PO CAPS
250.0000 mg | ORAL_CAPSULE | Freq: Two times a day (BID) | ORAL | Status: DC
  Administered 2014-03-12 – 2014-03-13 (×2): 250 mg via ORAL
  Filled 2014-03-12 (×3): qty 1

## 2014-03-12 MED ORDER — DOXYCYCLINE HYCLATE 100 MG PO TABS
100.0000 mg | ORAL_TABLET | Freq: Two times a day (BID) | ORAL | Status: DC
  Administered 2014-03-13: 100 mg via ORAL
  Filled 2014-03-12 (×2): qty 1

## 2014-03-12 NOTE — Progress Notes (Signed)
[facility] PROGRESS NOTE  [name] Bridges [id number] DOB: 10/01/1940 DOA: 03/08/2014 PCP: Florina Ou, MD  Assessment/Plan: Cellulitis of bilateral LE (distal aspect, with open wounds) -continue broad spectrum ABX Vancomycin and Zosyn as pt is diabetic; will treat for 1 more day with IV abx's and discharge in am if remains stable on doxycycline to complete 15 days of treatment. 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 -A1C  7.2  HTN -continue home medical regimen  -BP stable  Hyponatremia: due to use of demadex -improved/resolved with IVF's -will monitor  Obesity, morbid: Body mass index is 57.73 kg/(m^2). -low calorie diet and weight loss importance discussed with patient  DVT: lovenox  Code Status: Full Family Communication: no family at bedside Disposition Plan: to be determine    Consultants:  WOC  Procedures:  None   Antibiotics:  Vancomycin 1/13  Zosyn 1/13  HPI/Subjective: No fever, feeling ok. Erythema and pain continue improving  Objective: Filed Vitals:   03/12/14 1445  BP: 126/50  Pulse: 73  Temp: 98.2 F (36.8 C)  Resp: 20    Intake/Output Summary (Last 24 hours) at 03/12/14 1604 Last data filed at 03/12/14 1230  Gross per 24 hour  Intake   1320 ml  Output      0 ml  Net   1320 ml   Filed Weights   03/10/14 0535 03/11/14 0600 03/12/14 0555  Weight: 142.293 kg (313 lb 11.2 oz) 145.2 kg (320 lb 1.7 oz) 143.2 kg (315 lb 11.2 oz)    Exam:   General:  Afebrile, denies N/V/D; no CP or SOB. Feeling better. No hypoglycemic events at night.  Cardiovascular: S1 and S2, no rubs or gallops  Respiratory: CTA  bilaterally  Abdomen: soft, NT, ND, positive BS  Musculoskeletal: bilateral cellulitis/erythema and open wound on distal aspect of her legs (there is not involvement of her feet). Positive drainage out of wounds, serosanguineous (R > L); no odor. Erythema continue improving (L > R). Patient reports pain is better   Data Reviewed: Basic Metabolic Panel:  Recent Labs Lab 03/08/14 0925 03/09/14 0510 03/10/14 0510 03/12/14 0527  NA 130* 140 139 141  K 4.1 4.2 4.0 4.1  CL 99 104 105 103  CO2 28 27 26 27   GLUCOSE 181* 108* 110* 140*  BUN 22 24* 24* 20  CREATININE 0.74 0.73 0.79 0.89  CALCIUM 8.9 8.8 8.8 9.1   CBC:  Recent Labs Lab 03/08/14 0925 03/09/14 0510 03/10/14 0510  WBC 10.6* 7.9 8.1  HGB 13.0 11.1* 11.5*  HCT 40.8 34.9* 36.1  MCV 81.8 81.7 81.7  PLT 268 236 220   CBG:  Recent Labs Lab 03/11/14 1653 03/11/14 2216 03/12/14 0518 03/12/14 0748 03/12/14 1206  GLUCAP 111* 133* 128* 127* 143*    Recent Results (from the past 240 hour(s))  Culture, blood (routine x 2)     Status: None (Preliminary result)   Collection Time: 03/08/14  2:30 PM  Result Value Ref Range Status   Specimen Description BLOOD RIGHT HAND  Final   Special Requests BOTTLES DRAWN AEROBIC AND ANAEROBIC 10CC  Final   Culture   Final  BLOOD CULTURE RECEIVED NO GROWTH TO DATE CULTURE WILL BE HELD FOR 5 DAYS BEFORE ISSUING A FINAL NEGATIVE REPORT Performed at [facility]    Report Status PENDING  Incomplete  Culture, blood (routine x 2)     Status: None (Preliminary result)   Collection Time: 03/08/14  2:31 PM  Result Value Ref Range Status   Specimen Description BLOOD LEFT ARM  Final   Special Requests BOTTLES DRAWN AEROBIC AND ANAEROBIC 10CC  Final   Culture   Final           BLOOD CULTURE RECEIVED NO GROWTH TO DATE CULTURE WILL BE HELD FOR 5 DAYS BEFORE ISSUING A FINAL NEGATIVE REPORT Performed at [facility]    Report Status PENDING  Incomplete      Studies: No results found.  Scheduled Meds: . diclofenac  50 mg Oral BID AC  . enoxaparin (LOVENOX) injection  70 mg Subcutaneous Q24H  . insulin aspart  0-9 Units Subcutaneous TID WC  . insulin aspart protamine- aspart  10 Units Subcutaneous BID WC  . insulin aspart protamine- aspart  35 Units Subcutaneous Q supper  . irbesartan  150 mg Oral Daily  . piperacillin-tazobactam (ZOSYN)  IV  3.375 g Intravenous Q8H  . Potassium Gluconate   Oral QODAY  . torsemide  40 mg Oral QODAY  . traMADol  50 mg Oral Q12H  . vancomycin  750 mg Intravenous Q12H   Continuous Infusions: . sodium chloride 50 mL/hr at 03/11/14 9518    Active Problems:   Cellulitis of left lower extremity   Cellulitis   Morbid obesity   Hyponatremia    Time spent: 30 minutes    Barton Dubois  [facility] Pager [phone]. If 7PM-7AM, please contact night-coverage at [email or web address], password [facility] 03/12/2014, 4:04 PM  LOS: 4 days

## 2014-03-13 DIAGNOSIS — E1159 Type 2 diabetes mellitus with other circulatory complications: Secondary | ICD-10-CM

## 2014-03-13 LAB — GLUCOSE, CAPILLARY
GLUCOSE-CAPILLARY: 120 mg/dL — AB (ref 70–99)
Glucose-Capillary: 145 mg/dL — ABNORMAL HIGH (ref 70–99)

## 2014-03-13 MED ORDER — DOXYCYCLINE HYCLATE 100 MG PO TABS: 100.0000 mg | ORAL_TABLET | Freq: Two times a day (BID) | ORAL | Status: AC

## 2014-03-13 MED ORDER — POLYETHYLENE GLYCOL 3350 17 G PO PACK: 17.0000 g | PACK | Freq: Every day | ORAL | Status: AC | PRN

## 2014-03-13 MED ORDER — SACCHAROMYCES BOULARDII 250 MG PO CAPS: 250.0000 mg | ORAL_CAPSULE | Freq: Two times a day (BID) | ORAL | Status: AC

## 2014-03-13 MED FILL — Potassium Gluconate Tab 595 MG (99 MG Equiv K): ORAL | Qty: 1 | Status: AC

## 2014-03-13 NOTE — Care Management Note (Signed)
CARE MANAGEMENT NOTE 03/13/2014  Patient:  [name] Bridges, [name] Bridges   Account Number:  [id number]  Date Initiated:  03/13/2014  Documentation initiated by:  Marney Doctor  Subjective/Objective Assessment:   74 yo admitted with Cellulitis of left lower extremity     Action/Plan:   From home with spouse   Anticipated DC Date:  03/13/2014   Anticipated DC Plan:  [place]  CM consult      Choice offered to / List presented to:             Status of service:  Completed, signed off Medicare Important Message given?  YES (If response is "NO", the following Medicare IM given date fields will be blank) Date Medicare IM given:  03/13/2014 Medicare IM given by:  Marney Doctor Date Additional Medicare IM given:   Additional Medicare IM given by:    Discharge Disposition:    Per UR Regulation:  Reviewed for med. necessity/level of care/duration of stay  If discussed at [place] of Stay Meetings, dates discussed:    Comments:  03/13/14 Marney Doctor RN,BSN,NCM [phone] [facility] orders written.  Met with pt at bedside to offer choice.  Pt declines HHRN at this time.  She states she has had HH before and they only come one time and teach her and her husband the wound care and then they do it themselves. She states the RNs have been showing her how to do the wound care since she has been in the hospital so her and her husband will be able to take care of it at home.  Pt appreciaitive of CM involvement.  No other CM needs noted.

## 2014-03-13 NOTE — Progress Notes (Signed)
Patient discharged after instructions reviewed and signed.  Taken out via wheelchair with husband accompanying.

## 2014-03-13 NOTE — Discharge Summary (Signed)
Physician Discharge Summary  [name] Bridges [id number] DOB: September 07, 1940 DOA: 03/08/2014  PCP: Florina Ou, MD  Admit date: 03/08/2014 Discharge date: 03/13/2014  Time spent: 30 minutes  Recommendations for Outpatient Follow-up:  Repeat BMET to follow electrolytes Reassess LE cellulitis and need of longer antibiotic course  Discharge Diagnoses:  Bilaterally lower extremity Cellulitis/lymphadenitis  Morbid obesity Hyponatremia DM type 2 on insulin (A1C 7.2)  Discharge Condition: stable and improved. Discharge home with instruction to follow with PCP in 2 weeks and continue Wound care instructions and antibiotics as prescribed.  Diet recommendation: heart healthy and low carbohydrates   Filed Weights   03/11/14 0600 03/12/14 0555 03/13/14 0623  Weight: 145.2 kg (320 lb 1.7 oz) 143.2 kg (315 lb 11.2 oz) 144.5 kg (318 lb 9 oz)    History of present illness:  74 yo female who presented to [facility] ED with main concern of progressively worsening lower extremity edema and erythema that started few weeks prior to this admission. Pt was treated by her PCP with keflex and Augmentin but her symptoms has not gotten better and she is now having fevers, chills, malaise. She describes pain as throbbing and constant, pressure like, non radiating and with no specific alleviating factors, worse with ambulation. She has noted some drainage as well from both legs. She has been mostly bed bound as a result.   Hospital Course:  Cellulitis of bilateral LE (distal aspect, with open wounds) -after 5 days of IV antibiotics, her cellulitis improved and her open wounds started to get better; will discharge on doxycycline to complete 15 days of treatment and she has been advise to continue wound care instructions. 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 -low calorie diet and weight loss importance discussed with patient   Procedures:  See below for x-ray reports   Consultations:  [place] service  Discharge Exam: Filed Vitals:   03/13/14 1358  BP: 150/60  Pulse: 73  Temp: 98.4 F (36.9 C)  Resp: 16     General: Afebrile, denies N/V/D; no CP or SOB. Feeling better.   Cardiovascular: S1 and S2, no rubs or gallops  Respiratory: CTA bilaterally  Abdomen: soft, NT, ND, positive BS  Musculoskeletal: bilateral cellulitis/erythema and open wound on distal aspect of her legs (there is not involvement of her feet). Significant decrease in drainage out of wounds, no odor and her pain and erythema continue improving (L > R).   Discharge Instructions   Discharge Instructions    Diet - low sodium heart healthy    Complete by:  As directed      Discharge instructions    Complete by:  As directed   Take medications as prescribed Please arrange follow up with PCP in 2 weeks Maintain legs elevated Follow a low sodium/heart healthy diet          Current Discharge Medication List    START taking these medications   Details  doxycycline (VIBRA-TABS) 100 MG tablet Take 1 tablet (100 mg total) by mouth every 12 (twelve) hours. Qty: 20 tablet, Refills: 0    saccharomyces boulardii (FLORASTOR) 250 MG capsule  Take 1 capsule (250 mg total) by mouth 2 (two) times daily. Qty: 60 capsule, Refills: 0      CONTINUE these medications which have CHANGED   Details  polyethylene glycol (MIRALAX / GLYCOLAX) packet Take 17 g by mouth daily as needed. Qty: 14 each, Refills: 0      CONTINUE these medications which have NOT CHANGED   Details  diclofenac (VOLTAREN) 50 MG EC tablet  Take 50 mg by mouth 2 (two) times daily.    insulin NPH-regular Human (NOVOLIN 70/30) (70-30) 100 UNIT/ML injection Inject 10-50 Units into the skin 3 (three) times daily. Takes 10 units in the morning and lunch, then 50 units with supper    potassium gluconate 595 MG TABS Take 595 mg by mouth every other day.     Red Yeast Rice Extract (RED YEAST RICE PO) Take 120 mg by mouth every other day.    sodium chloride (OCEAN) 0.65 % SOLN nasal spray Place 1 spray into both nostrils as needed for congestion.    torsemide (DEMADEX) 20 MG tablet Take 40 mg by mouth every other day.     traMADol (ULTRAM) 50 MG tablet Take 1 tablet (50 mg total) by mouth every 8 (eight) hours as needed for pain. Qty: 60 tablet, Refills: 0    valsartan (DIOVAN) 160 MG tablet Take 160 mg by mouth daily with breakfast.     ketorolac (TORADOL) 10 MG tablet Take 1 tablet (10 mg total) by mouth every 8 (eight) hours as needed for pain. Qty: 20 tablet, Refills: 0      STOP taking these medications     cephALEXin (KEFLEX) 500 MG capsule      Influenza Vac Split Quad (FLUZONE) 0.25 ML injection        Allergies  Allergen Reactions  . Statins Other (See Comments)    Muscle spasms   . Codeine Nausea And Vomiting  . Sulfonamide Derivatives Nausea And Vomiting   Follow-up Information    Follow up with Florina Ou, MD. Schedule an appointment as soon as possible for a visit in 2 weeks.   Specialty:  Family Medicine   Contact information:   [address] [place] [address] [phone]       The results of significant diagnostics from this hospitalization (including imaging, microbiology, ancillary and laboratory) are listed below for reference.    Significant Diagnostic Studies: No results found.  Microbiology: Recent Results (from the past 240 hour(s))  Culture, blood (routine x 2)     Status: None (Preliminary result)   Collection Time: 03/08/14  2:30 PM  Result  Value Ref Range Status   Specimen Description BLOOD RIGHT HAND  Final   Special Requests BOTTLES DRAWN AEROBIC AND ANAEROBIC 10CC  Final   Culture   Final           BLOOD CULTURE RECEIVED NO GROWTH TO DATE CULTURE WILL BE HELD FOR 5 DAYS BEFORE ISSUING A FINAL NEGATIVE REPORT Performed at [facility]    Report Status PENDING  Incomplete  Culture, blood (routine x 2)     Status: None (Preliminary result)   Collection Time: 03/08/14  2:31 PM  Result Value Ref Range Status   Specimen Description BLOOD LEFT ARM  Final   Special Requests BOTTLES DRAWN AEROBIC AND ANAEROBIC 10CC  Final   Culture   Final           BLOOD CULTURE RECEIVED NO GROWTH TO DATE CULTURE WILL  BE HELD FOR 5 DAYS BEFORE ISSUING A FINAL NEGATIVE REPORT Performed at [facility]    Report Status PENDING  Incomplete     Labs: Basic Metabolic Panel:  Recent Labs Lab 03/08/14 0925 03/09/14 0510 03/10/14 0510 03/12/14 0527  NA 130* 140 139 141  K 4.1 4.2 4.0 4.1  CL 99 104 105 103  CO2 28 27 26 27   GLUCOSE 181* 108* 110* 140*  BUN 22 24* 24* 20  CREATININE 0.74 0.73 0.79 0.89  CALCIUM 8.9 8.8 8.8 9.1   CBC:  Recent Labs Lab 03/08/14 0925 03/09/14 0510 03/10/14 0510  WBC 10.6* 7.9 8.1  HGB 13.0 11.1* 11.5*  HCT 40.8 34.9* 36.1  MCV 81.8 81.7 81.7  PLT 268 236 220   CBG:  Recent Labs Lab 03/12/14 1206 03/12/14 1733 03/12/14 2202 03/13/14 0724 03/13/14 1137  GLUCAP 143* 140* 79 120* 145*    Signed:  Barton Dubois  [facility] 03/13/2014, 2:59 PM

## 2014-03-14 LAB — CULTURE, BLOOD (ROUTINE X 2)
CULTURE: NO GROWTH
Culture: NO GROWTH

## 2014-03-29 ENCOUNTER — Ambulatory Visit (HOSPITAL_COMMUNITY)
Admission: RE | Admit: 2014-03-29 | Discharge: 2014-03-29 | Disposition: A | Discharge status: Home / Self Care | Payer: Medicare HMO | Source: Ambulatory Visit | Attending: Vascular Surgery | Admitting: Vascular Surgery

## 2014-03-29 ENCOUNTER — Ambulatory Visit (INDEPENDENT_AMBULATORY_CARE_PROVIDER_SITE_OTHER): Payer: Medicare HMO | Admitting: Vascular Surgery

## 2014-03-29 ENCOUNTER — Other Ambulatory Visit (HOSPITAL_COMMUNITY): Payer: Self-pay | Admitting: Nurse Practitioner

## 2014-03-29 ENCOUNTER — Encounter: Payer: Self-pay | Admitting: Vascular Surgery

## 2014-03-29 VITALS — BP 137/71 | HR 74 | Temp 97.4°F | Resp 20 | Ht 63.0 in | Wt 320.0 lb

## 2014-03-29 DIAGNOSIS — I8390 Asymptomatic varicose veins of unspecified lower extremity: Secondary | ICD-10-CM

## 2014-03-29 DIAGNOSIS — I83209 Varicose veins of unspecified lower extremity with both ulcer of unspecified site and inflammation: Secondary | ICD-10-CM | POA: Insufficient documentation

## 2014-03-29 DIAGNOSIS — I868 Varicose veins of other specified sites: Secondary | ICD-10-CM

## 2014-03-29 NOTE — Progress Notes (Signed)
Patient name: [name] Bridges MRN: [id number] DOB: March 11, 1940 Sex: female   Referred by: Modena Morrow  Reason for referral:  Chief Complaint  Patient presents with  . Venous Insufficiency    Hx of cellulites on both legs x 7-8 years.  Ref- Dr. Florina Ou.  Venous reflux done today.     HISTORY OF PRESENT ILLNESS: Patient presents today for discussion of bilateral lower from the cellulitis and superficial ulceration. This is been present for several years and has been progressive. She has had multiple episodes of bilateral distal medial calf cellulitis. This is been treated in the past with oral antibiotics. She recently had admission to the hospital last month when she continues to have persistent cellulitis despite oral for treatment. She was hospitalized with IV antibiotics and had the resolution of her cellulitis. She is nearly healed the ulcerations as well. She did have some blistering on her distal thigh on this occasion the medial aspect as well. She is morbidly obese weighing 320 pounds and is 5 foot 2 inches tall. She does wear graduated compression garments 30-40 mmHg continues to have this difficulty despite this. She does have chronic discomfort over these areas of hemosiderin deposit and brawny edema. She has no history of DVT. She does have a lift chair and keeps her legs elevated when possible.  Past Medical History  Diagnosis Date  . S/P appy   . Diabetes mellitus   . Hypertension   . Hyperlipidemia   . Cellulitis of left lower extremity     Begin antibiotic 04/12/12  . Gout   . Arthritis   . Incarcerated ventral hernia s/p lap repair w mesh 05/13/2012    Past Surgical History  Procedure Laterality Date  . Vaginal hysterectomy    . Ectopic pregnancy surgery    . Colonoscopy  2003, 12/17/10    2003: small adenoma 2012: small adenoma (6-84mm) and diverticulosis  . Appendectomy    . Tonsillectomy and adenoidectomy    . Ventral hernia repair N/A 05/02/2012    Procedure:  LAPAROSCOPIC VENTRAL HERNIA Repair with mesh;  Surgeon: Adin Hector, MD;  Location: [facility];  Service: General;  Laterality: N/A;  Lysis of adhesions,   Umbillectomy    History   Social History  . Marital Status: Married    Spouse Name: N/A    Number of Children: N/A  . Years of Education: N/A   Occupational History  . Not on file.   Social History Main Topics  . Smoking status: Never Smoker   . Smokeless tobacco: Never Used  . Alcohol Use: No  . Drug Use: No  . Sexual Activity: Not on file   Other Topics Concern  . Not on file   Social History Narrative    Family History  Problem Relation Age of Onset  . Diabetes Mother   . Stroke Father   . Diabetes Sister   . Diabetes Brother     Allergies as of 03/29/2014 - Review Complete 03/29/2014  Allergen Reaction Noted  . Statins Other (See Comments) 03/08/2014  . Codeine Nausea And Vomiting 03/22/2010  . Sulfonamide derivatives Nausea And Vomiting     Current Outpatient Prescriptions on File Prior to Visit  Medication Sig Dispense Refill  . diclofenac (VOLTAREN) 50 MG EC tablet Take 50 mg by mouth 2 (two) times daily.    . insulin NPH-regular Human (NOVOLIN 70/30) (70-30) 100 UNIT/ML injection Inject 10-50 Units into the skin 3 (three) times daily. Takes 10  units in the morning and lunch, then 50 units with supper    . polyethylene glycol (MIRALAX / GLYCOLAX) packet Take 17 g by mouth daily as needed. 14 each 0  . potassium gluconate 595 MG TABS Take 595 mg by mouth every other day.     . Red Yeast Rice Extract (RED YEAST RICE PO) Take 120 mg by mouth every other day.    . saccharomyces boulardii (FLORASTOR) 250 MG capsule Take 1 capsule (250 mg total) by mouth 2 (two) times daily. 60 capsule 0  . sodium chloride (OCEAN) 0.65 % SOLN nasal spray Place 1 spray into both nostrils as needed for congestion.    . torsemide (DEMADEX) 20 MG tablet Take 40 mg by mouth every other day.     . traMADol (ULTRAM) 50 MG tablet Take  1 tablet (50 mg total) by mouth every 8 (eight) hours as needed for pain. (Patient taking differently: Take 50 mg by mouth 2 (two) times daily as needed for moderate pain. ) 60 tablet 0  . valsartan (DIOVAN) 160 MG tablet Take 160 mg by mouth daily with breakfast.     . doxycycline (VIBRA-TABS) 100 MG tablet Take 1 tablet (100 mg total) by mouth every 12 (twelve) hours. (Patient not taking: Reported on 03/29/2014) 20 tablet 0  . ketorolac (TORADOL) 10 MG tablet Take 1 tablet (10 mg total) by mouth every 8 (eight) hours as needed for pain. (Patient not taking: Reported on 03/29/2014) 20 tablet 0   No current facility-administered medications on file prior to visit.     REVIEW OF SYSTEMS:  Positives indicated with an "X"  CARDIOVASCULAR:  [ ]  chest pain   [ ]  chest pressure   [ ]  palpitations   [ ]  orthopnea   [ ]  dyspnea on exertion   [ ]  claudication   [ ]  rest pain   [ ]  DVT   [ ]  phlebitis PULMONARY:   [ ]  productive cough   [ ]  asthma   [ ]  wheezing NEUROLOGIC:   [ ]  weakness  [ ]  paresthesias  [ ]  aphasia  [ ]  amaurosis  [ ]  dizziness HEMATOLOGIC:   [ ]  bleeding problems   [ ]  clotting disorders MUSCULOSKELETAL:  [ ]  joint pain   [ ]  joint swelling GASTROINTESTINAL: [ ]   blood in stool  [ ]   hematemesis GENITOURINARY:  [ ]   dysuria  [ ]   hematuria PSYCHIATRIC:  [ ]  history of major depression INTEGUMENTARY:  [ ]  rashes  [x ] ulcers CONSTITUTIONAL:  [x ] fever   [x ] chills  PHYSICAL EXAMINATION:  General: The patient is a well-nourished female, in no acute distress. Vital signs are BP 137/71 mmHg  Pulse 74  Temp(Src) 97.4 F (36.3 C) (Oral)  Resp 20  Ht 5\' 3"  (1.6 m)  Wt 320 lb (145.151 kg)  BMI 56.70 kg/m2  SpO2 98% Pulmonary: There is a good air exchange  Abdomen: Obese, nontender. Musculoskeletal: There are no major deformities.  There is no significant extremity pain. Neurologic: No focal weakness or paresthesias are detected, Skin: She does have erythema on the medial  BILATERALLY and also has some healing ulcerations over her medial thighs bilaterally. Psychiatric: The patient has normal affect. Cardiovascular: 2+ dorsalis pedis pulses bilaterally   [facility] Vascular Lab Studies:  Ordered and Independently Reviewed shows marked enlargement in her great saphenous vein bilaterally with reflux throughout its course. No evidence of DVT seen.  Impression and Plan:  Had long  discussion with the patient. She is having chronic recurrent episodes of cellulitis in the medial aspect with venous ulceration as well. This is despite the use of 30 40 mmHg compression. I did explain the option of laser ablation of her saphenous vein. I reimaged her saphenous vein with SonoSite and this does show reflux throughout its course. She is morbidly obese and the vein does run rather deep. I told her that this would make things a bit more technically challenging his force of laser ablation of her great saphenous vein but do feel comfortable proceeding with this. I explained the procedure taking approximately 1 hour under local anesthesia. He worsen or left and therefore we would recommend right leg ablation first followed by a left leg ablation as long as she does well with her first procedure. She understands this and wished to proceed as soon as possible. I explained that the {her venous hypertension and reduce the frequency of her episodes of cellulitis and ulceration.    Tierre Gerard [facility] [place] Office: [phone]

## 2014-04-04 ENCOUNTER — Other Ambulatory Visit: Payer: Self-pay | Admitting: *Deleted

## 2014-04-04 DIAGNOSIS — I83224 Varicose veins of left lower extremity with both ulcer of heel and midfoot and inflammation: Principal | ICD-10-CM

## 2014-04-04 DIAGNOSIS — I83225 Varicose veins of left lower extremity with both ulcer other part of foot and inflammation: Principal | ICD-10-CM

## 2014-04-04 DIAGNOSIS — I83228 Varicose veins of left lower extremity with both ulcer of other part of lower extremity and inflammation: Principal | ICD-10-CM

## 2014-04-04 DIAGNOSIS — I83219 Varicose veins of right lower extremity with both ulcer of unspecified site and inflammation: Principal | ICD-10-CM

## 2014-04-04 DIAGNOSIS — I83221 Varicose veins of left lower extremity with both ulcer of thigh and inflammation: Secondary | ICD-10-CM

## 2014-04-04 DIAGNOSIS — I83223 Varicose veins of left lower extremity with both ulcer of ankle and inflammation: Principal | ICD-10-CM

## 2014-04-04 DIAGNOSIS — I83211 Varicose veins of right lower extremity with both ulcer of thigh and inflammation: Secondary | ICD-10-CM

## 2014-04-04 DIAGNOSIS — I83214 Varicose veins of right lower extremity with both ulcer of heel and midfoot and inflammation: Principal | ICD-10-CM

## 2014-04-04 DIAGNOSIS — I83215 Varicose veins of right lower extremity with both ulcer other part of foot and inflammation: Principal | ICD-10-CM

## 2014-04-04 DIAGNOSIS — I83213 Varicose veins of right lower extremity with both ulcer of ankle and inflammation: Principal | ICD-10-CM

## 2014-04-04 DIAGNOSIS — I83212 Varicose veins of right lower extremity with both ulcer of calf and inflammation: Principal | ICD-10-CM

## 2014-04-04 DIAGNOSIS — I83229 Varicose veins of left lower extremity with both ulcer of unspecified site and inflammation: Principal | ICD-10-CM

## 2014-04-04 DIAGNOSIS — I83218 Varicose veins of right lower extremity with both ulcer of other part of lower extremity and inflammation: Principal | ICD-10-CM

## 2014-04-04 DIAGNOSIS — I83222 Varicose veins of left lower extremity with both ulcer of calf and inflammation: Principal | ICD-10-CM

## 2014-04-12 ENCOUNTER — Encounter: Payer: Self-pay | Admitting: Vascular Surgery

## 2014-04-13 ENCOUNTER — Ambulatory Visit (INDEPENDENT_AMBULATORY_CARE_PROVIDER_SITE_OTHER): Payer: Medicare HMO | Admitting: Vascular Surgery

## 2014-04-13 ENCOUNTER — Encounter: Payer: Self-pay | Admitting: Vascular Surgery

## 2014-04-13 VITALS — BP 130/80 | HR 80 | Resp 16 | Ht 62.5 in | Wt 320.0 lb

## 2014-04-13 DIAGNOSIS — I83209 Varicose veins of unspecified lower extremity with both ulcer of unspecified site and inflammation: Secondary | ICD-10-CM

## 2014-04-13 DIAGNOSIS — I83229 Varicose veins of left lower extremity with both ulcer of unspecified site and inflammation: Secondary | ICD-10-CM

## 2014-04-13 DIAGNOSIS — I83219 Varicose veins of right lower extremity with both ulcer of unspecified site and inflammation: Secondary | ICD-10-CM | POA: Insufficient documentation

## 2014-04-13 DIAGNOSIS — L97929 Non-pressure chronic ulcer of unspecified part of left lower leg with unspecified severity: Secondary | ICD-10-CM

## 2014-04-13 DIAGNOSIS — L97919 Non-pressure chronic ulcer of unspecified part of right lower leg with unspecified severity: Secondary | ICD-10-CM

## 2014-04-13 HISTORY — PX: ENDOVENOUS ABLATION SAPHENOUS VEIN W/ LASER: SUR449

## 2014-04-13 NOTE — Progress Notes (Signed)
   Laser Ablation Procedure    Date: 04/13/2014    [name] Bridges DOB:Jul 29, 1940  Consent signed: Yes  SurgeonT.F. Melvia Matousek  Procedure: Laser Ablation: right Greater Saphenous Vein  There were no vitals taken for this visit.  Start:10;45    End time: 1140  Tumescent Anesthesia: 400 cc 0.9% NaCl with 50 cc Lidocaine HCL with 1% Epi and 15 cc 8.4% NaHCO3  Local Anesthesia: 4 cc Lidocaine HCL and NaHCO3 (ratio 2:1)  Pulsed mode:15 watts, 555ms delay, 1.0 duration and Total energy: 1645, Total pulses: 1, Total time: 1;48     Patient tolerated procedure well: Yes  Notes: Patient is morbidly obese. Her saphenous vein ran rather deep in her thigh. State below saphenofemoral junction since visualization was near impossible at this level. She tolerated the procedure quite well.  Description of Procedure:  After marking the course of the secondary varicosities, the patient was placed on the operating table in the supine position, and the right leg was prepped and draped in sterile fashion.   Local anesthetic was administered and under ultrasound guidance the saphenous vein was accessed with a micro needle and guide wire; then the mirco puncture sheath was place.  A guide wire was inserted saphenofemoral junction , followed by a 5 french sheath.  The position of the sheath and then the laser fiber below the junction was confirmed using the ultrasound.  Tumescent anesthesia was administered along the course of the saphenous vein using ultrasound guidance. The patient was placed in Trendelenburg position and protective laser glasses were placed on patient and staff, and the laser was fired at at 15 watt continuous mode for a total of 1645 joules.   Diamond Nickel strips were applied to the stab wounds and ABD pads and thigh high compression stockings were applied.  Ace wrap bandages were applied over the phlebectomy sites and at the top of the saphenofemoral junction. Blood loss was less than 15 cc.  The  patient ambulated out of the operating room having tolerated the procedure well.

## 2014-04-14 ENCOUNTER — Encounter: Payer: Self-pay | Admitting: Vascular Surgery

## 2014-04-18 ENCOUNTER — Telehealth: Payer: Self-pay | Admitting: *Deleted

## 2014-04-18 NOTE — Telephone Encounter (Signed)
    04/18/2014  Time: 9:31 AM   Patient Name: [name] Bridges  Patient of: T.F. Early  Procedure:Laser Ablation right greater saphenous vein 04-13-2014   Reached patient at home and checked  Her status  Yes    Comments/Actions Taken: Mrs. Lessner states she is experiencing no problem with right leg pain or swelling.  She states the Ace Wrap slips down her right thigh but she re-wraps it.  Reviewed post procedural instructions with her and reminded her of post LA duplex and VV FU with Dr. Donnetta Hutching on 04-20-2014.      @SIGNATURE @

## 2014-04-19 ENCOUNTER — Encounter: Payer: Self-pay | Admitting: Vascular Surgery

## 2014-04-20 ENCOUNTER — Ambulatory Visit (HOSPITAL_COMMUNITY)
Admission: RE | Admit: 2014-04-20 | Discharge: 2014-04-20 | Disposition: A | Discharge status: Home / Self Care | Payer: Medicare HMO | Source: Ambulatory Visit | Attending: Vascular Surgery | Admitting: Vascular Surgery

## 2014-04-20 ENCOUNTER — Encounter: Payer: Self-pay | Admitting: Vascular Surgery

## 2014-04-20 ENCOUNTER — Ambulatory Visit (INDEPENDENT_AMBULATORY_CARE_PROVIDER_SITE_OTHER): Payer: Medicare HMO | Admitting: Vascular Surgery

## 2014-04-20 VITALS — BP 148/75 | HR 75 | Resp 16 | Ht 62.5 in | Wt 310.0 lb

## 2014-04-20 DIAGNOSIS — I83213 Varicose veins of right lower extremity with both ulcer of ankle and inflammation: Secondary | ICD-10-CM

## 2014-04-20 DIAGNOSIS — I83218 Varicose veins of right lower extremity with both ulcer of other part of lower extremity and inflammation: Secondary | ICD-10-CM

## 2014-04-20 DIAGNOSIS — I83215 Varicose veins of right lower extremity with both ulcer other part of foot and inflammation: Secondary | ICD-10-CM

## 2014-04-20 DIAGNOSIS — I83219 Varicose veins of right lower extremity with both ulcer of unspecified site and inflammation: Secondary | ICD-10-CM

## 2014-04-20 DIAGNOSIS — I83212 Varicose veins of right lower extremity with both ulcer of calf and inflammation: Secondary | ICD-10-CM

## 2014-04-20 DIAGNOSIS — I83209 Varicose veins of unspecified lower extremity with both ulcer of unspecified site and inflammation: Secondary | ICD-10-CM

## 2014-04-20 DIAGNOSIS — I83211 Varicose veins of right lower extremity with both ulcer of thigh and inflammation: Secondary | ICD-10-CM

## 2014-04-20 DIAGNOSIS — I83214 Varicose veins of right lower extremity with both ulcer of heel and midfoot and inflammation: Secondary | ICD-10-CM

## 2014-04-20 NOTE — Progress Notes (Signed)
Here today for one-week follow-up after ablation of her right great saphenous vein. Minimal discomfort associated with this. She has been compliant with her wrapping.  On physical exam she does have some mild bruising at the insertion site below her knee.  Venous duplex today was reviewed with the patient. This shows closure of her saphenous vein from the knee to of 3.7 cm below the saphenofemoral junction. There is a area in the midportion thigh that appears only partially occluded.  Impression and plan: Good initial result with ablation of her right great saphenous vein. This was a difficult procedure due to her obesity and the fact that her vein runs quite deep. She does have a plan for similar treatment on her left leg on 05/11/2014. We will see her again at that time

## 2014-05-10 ENCOUNTER — Encounter: Payer: Self-pay | Admitting: Vascular Surgery

## 2014-05-11 ENCOUNTER — Encounter: Payer: Self-pay | Admitting: Vascular Surgery

## 2014-05-11 ENCOUNTER — Ambulatory Visit (INDEPENDENT_AMBULATORY_CARE_PROVIDER_SITE_OTHER): Payer: Medicare HMO | Admitting: Vascular Surgery

## 2014-05-11 VITALS — BP 146/58 | HR 81 | Resp 18 | Ht 62.5 in | Wt 313.0 lb

## 2014-05-11 DIAGNOSIS — I83209 Varicose veins of unspecified lower extremity with both ulcer of unspecified site and inflammation: Secondary | ICD-10-CM

## 2014-05-11 HISTORY — PX: ENDOVENOUS ABLATION SAPHENOUS VEIN W/ LASER: SUR449

## 2014-05-11 NOTE — Progress Notes (Signed)
.      Laser Ablation Procedure    Date: 05/11/2014   [name] Bridges [name] Bridges  Consent signed: Yes    Surgeon:  Dr. Sherren Mocha Dillan Lunden  Procedure: Laser Ablation: left Greater Saphenous Vein  BP 146/58 mmHg  Pulse 81  Resp 18  Ht 5' 2.5" (1.588 m)  Wt 313 lb (141.976 kg)  BMI 56.30 kg/m2  Tumescent Anesthesia: 400 cc 0.9% NaCl with 50 cc Lidocaine HCL with 1% Epi and 15 cc 8.4% NaHCO3  Local Anesthesia: 4 cc Lidocaine HCL and NaHCO3 (ratio 2:1)  15 watts continuous mode        Total energy: 2361 Joules   Total time: 2:36      Patient tolerated procedure well    Description of Procedure:  After marking the course of the secondary varicosities, the patient was placed on the operating table in the supine position, and the left leg was prepped and draped in sterile fashion.   Local anesthetic was administered and under ultrasound guidance the saphenous vein was accessed with a micro needle and guide wire; then the mirco puncture sheath was placed.  A guide wire was inserted saphenofemoral junction , followed by a 5 french sheath.  The position of the sheath and then the laser fiber below the junction was confirmed using the ultrasound.  Tumescent anesthesia was administered along the course of the saphenous vein using ultrasound guidance. The patient was placed in Trendelenburg position and protective laser glasses were placed on patient and staff, and the laser was fired at 15 watts continuous mode advancing 1-46mm/second for a total of 2361 joules.        Steri strips were applied to the stab wounds and ABD pads and thigh high compression stockings were applied.  Ace wrap bandages were applied over the phlebectomy sites and at the top of the saphenofemoral junction. Blood loss was less than 15 cc.  The patient ambulated out of the operating room having tolerated the procedure well.  Uneventful ablation from the knee to just below the saphenofemoral junction.

## 2014-05-16 ENCOUNTER — Telehealth: Payer: Self-pay | Admitting: *Deleted

## 2014-05-16 NOTE — Telephone Encounter (Signed)
    05/16/2014  Time: 9:02 AM   Patient Name: [name] Bridges  Patient of: T.F. Early  Procedure:Laser Ablation left greater saphenous vein 05-11-2014  Reached patient at home and checked  Her status  Yes    Comments/Actions Taken: No problems with left leg pain or swelling. Reviewed post procedural instructions with her and reminded her of post LA duplex and VV FU with Dr. Donnetta Hutching on 05-18-2014.       @SIGNATURE @

## 2014-05-17 ENCOUNTER — Encounter: Payer: Self-pay | Admitting: Vascular Surgery

## 2014-05-18 ENCOUNTER — Ambulatory Visit (INDEPENDENT_AMBULATORY_CARE_PROVIDER_SITE_OTHER): Payer: Medicare HMO | Admitting: Vascular Surgery

## 2014-05-18 ENCOUNTER — Encounter: Payer: Self-pay | Admitting: Vascular Surgery

## 2014-05-18 ENCOUNTER — Ambulatory Visit (HOSPITAL_COMMUNITY)
Admission: RE | Admit: 2014-05-18 | Discharge: 2014-05-18 | Disposition: A | Discharge status: Home / Self Care | Payer: Medicare HMO | Source: Ambulatory Visit | Attending: Vascular Surgery | Admitting: Vascular Surgery

## 2014-05-18 VITALS — BP 136/76 | HR 71 | Resp 16 | Ht 62.5 in | Wt 313.0 lb

## 2014-05-18 DIAGNOSIS — I83225 Varicose veins of left lower extremity with both ulcer other part of foot and inflammation: Secondary | ICD-10-CM

## 2014-05-18 DIAGNOSIS — I83223 Varicose veins of left lower extremity with both ulcer of ankle and inflammation: Secondary | ICD-10-CM

## 2014-05-18 DIAGNOSIS — I83221 Varicose veins of left lower extremity with both ulcer of thigh and inflammation: Secondary | ICD-10-CM | POA: Diagnosis not present

## 2014-05-18 DIAGNOSIS — I83209 Varicose veins of unspecified lower extremity with both ulcer of unspecified site and inflammation: Secondary | ICD-10-CM

## 2014-05-18 DIAGNOSIS — I83228 Varicose veins of left lower extremity with both ulcer of other part of lower extremity and inflammation: Secondary | ICD-10-CM

## 2014-05-18 DIAGNOSIS — I83224 Varicose veins of left lower extremity with both ulcer of heel and midfoot and inflammation: Secondary | ICD-10-CM | POA: Diagnosis not present

## 2014-05-18 DIAGNOSIS — I83222 Varicose veins of left lower extremity with both ulcer of calf and inflammation: Secondary | ICD-10-CM | POA: Diagnosis not present

## 2014-05-18 DIAGNOSIS — I83229 Varicose veins of left lower extremity with both ulcer of unspecified site and inflammation: Secondary | ICD-10-CM

## 2014-05-18 NOTE — Progress Notes (Signed)
Here for follow-up of her ablation of her left great saphenous vein one week ago. She has done extremely well. She reports slightly more bruising on her left leg than right. No significant discomfort as well.  She does feel that she is having less swelling around her right calf and ankle following ablation several weeks ago of her great saphenous vein.  On physical exam there is slight bruising in her medial thigh and no skin injury.  She did undergo duplex today and I reviewed this and discussed with the patient. This shows closure of the great saphenous vein throughout the thigh. No evidence of DVT.  Impression and plan: Successful staged laser ablation of great saphenous vein. She will continue elevation and compression when possible. Will see [place] again on an as-needed basis

## 2014-09-14 ENCOUNTER — Telehealth: Payer: Self-pay | Admitting: *Deleted

## 2014-09-14 NOTE — Telephone Encounter (Signed)
Returning Mrs. Mccoy's earlier telephone voice message.  Mrs. Radovich is s/p endovenous laser ablation right greater saphenous vein 04-13-2014 and endovenous left greater saphenous vein 05-11-2014 by Curt Jews MD.  Mrs. Hyden states that her lower extremity cellulitis has reoccurred L>R and her primary physician is treating her and she is currently on antibiotics.  Mrs. Riolo stated that her primary care physician wanted her to make Dr. Donnetta Hutching aware that cellulitis has reoccurred.  Offered to make appointment for Mrs. Cordy to see Dr. Donnetta Hutching.  He is not in office until 09-26-2014.  Mrs. Hiltner asked that I speak with Dr. Donnetta Hutching on 09-26-2014 and get his recommendation first before making an appointment.  I will follow up with Dr. Donnetta Hutching on 09-26-2014 and call Mrs. Meas back with his recommendations.

## 2014-09-26 ENCOUNTER — Telehealth: Payer: Self-pay | Admitting: *Deleted

## 2014-09-26 NOTE — Telephone Encounter (Signed)
Discussed [name] Bridges's reoccurance of cellulitis and [name] Bridges's concerns with [name] Bridges.  His recommendation was to have her come into [facility] for venous reflux study and for follow up visit with him.  Called [name] Bridges with [name] Bridges recommendation. [name] Bridges agrees with [name] Bridges recommendation and would like a follow up appointment with [name] Bridges and understands that a venous reflux study is needed diagnostically. She states she is still on antibiotics and "the cellulitis is 60-70% better." She states the symptoms are primarily on her left leg (left foot swelling, left ankle ulcer 5" inches above her left ankle, and blistering between her left ankle and shin). Notified scheduler to book venous reflux study (left leg) and [facility] follow up with [name] Bridges and to notify [name] Bridges of appointments.

## 2014-09-27 ENCOUNTER — Other Ambulatory Visit: Payer: Self-pay | Admitting: *Deleted

## 2014-09-27 DIAGNOSIS — L97929 Non-pressure chronic ulcer of unspecified part of left lower leg with unspecified severity: Principal | ICD-10-CM

## 2014-09-27 DIAGNOSIS — I83029 Varicose veins of left lower extremity with ulcer of unspecified site: Secondary | ICD-10-CM

## 2014-09-28 ENCOUNTER — Ambulatory Visit: Payer: Medicare HMO | Admitting: Vascular Surgery

## 2014-09-28 ENCOUNTER — Encounter (HOSPITAL_COMMUNITY): Payer: Medicare HMO

## 2014-10-24 ENCOUNTER — Encounter (HOSPITAL_COMMUNITY): Payer: Medicare HMO

## 2014-10-24 ENCOUNTER — Ambulatory Visit: Payer: Medicare HMO | Admitting: Vascular Surgery

## 2014-10-31 ENCOUNTER — Encounter: Payer: Self-pay | Admitting: Internal Medicine

## 2015-01-01 ENCOUNTER — Encounter: Payer: Self-pay | Admitting: Podiatry

## 2015-01-01 ENCOUNTER — Ambulatory Visit (INDEPENDENT_AMBULATORY_CARE_PROVIDER_SITE_OTHER): Payer: Medicare HMO

## 2015-01-01 ENCOUNTER — Ambulatory Visit (INDEPENDENT_AMBULATORY_CARE_PROVIDER_SITE_OTHER): Payer: Medicare HMO | Admitting: Podiatry

## 2015-01-01 DIAGNOSIS — L600 Ingrowing nail: Secondary | ICD-10-CM | POA: Diagnosis not present

## 2015-01-01 DIAGNOSIS — M79673 Pain in unspecified foot: Secondary | ICD-10-CM | POA: Diagnosis not present

## 2015-01-01 DIAGNOSIS — B351 Tinea unguium: Secondary | ICD-10-CM

## 2015-01-01 DIAGNOSIS — R52 Pain, unspecified: Secondary | ICD-10-CM | POA: Diagnosis not present

## 2015-01-01 NOTE — Progress Notes (Signed)
Subjective:    Patient ID: [name] Bridges, female    DOB: 12/16/1940, 74 y.o.   MRN: [id number]  HPI  74 year old female presents the office today for construction ingrown toenail along the left big toe along the medial nail border. She states that she was seen by Dr. Gershon Mussel any trimmed the end of the toenail but is remains sore over the last couple of months. She states that he did not numb the toe and did not cut out the corner of the toenail, he just trimmed the toenails. X-rays were taken which did reveal a bone spur and she was sent to me for surgical evaluation. She does have a history of reoccurring cellulitis to both of her legs. She is diabetic but she states her blood sugar has remained controlled. She denies any open sores. She denies any claudication symptoms. No tingling or numbness. No other complaints at this time.   Review of Systems  All other systems reviewed and are negative.      Objective:   Physical Exam General: AAO x3, NAD  Dermatological: Skin is warm, dry and supple bilateral. There is incurvation along the medial nail border of the left hallux toenail on the distal aspect. There is incurvation along the proximal nail border have there is no pain along this area. There is no edema, erythema, drainage/purulence, ascending cellulitis, fluctuance or crepitus. There is no clinical signs of infection. Remainder nails are also hypertrophic, dystrophic, brittle, discolored, elongated 10. There is tenderness to palpation over the nails 1-5 bilaterally. No surrounding erythema or drainage. There are no open sores, no preulcerative lesions, no rash or signs of infection present.  Vascular: Dorsalis Pedis artery and Posterior Tibial artery pedal pulses are 2/4 bilateral with immedate capillary fill time. Pedal hair growth present. No varicosities and no lower extremity edema present bilateral. There is no pain with calf compression, swelling, warmth, erythema.   Neruologic:  Grossly intact via light touch bilateral. Vibratory intact via tuning fork bilateral. Protective threshold with Semmes Wienstein monofilament intact to all pedal sites bilateral. Patellar and Achilles deep tendon reflexes 2+ bilateral. No Babinski or clonus noted bilateral.   Musculoskeletal: No gross boney pedal deformities bilateral. There is tenderness in the left medial hallux toenail distally on incurvation of the toenails. There is also mild diffuse tenderness the remainder the toenails as they are elongated and putting pressure in shoes. No pain, crepitus, or limitation noted with foot and ankle range of motion bilateral. Muscular strength 5/5 in all groups tested bilateral.  Gait: Unassisted, Nonantalgic.         Assessment & Plan:  74 year old female with ingrown toenail left medial hallux nail border as well as symptomatic onychomycosis -X-rays were obtained and reviewed with the patient. There is a very small dorsal exostosis off the distal aspect of the phalanx of the hallux. No definitive evidence of acute fracture or stress fracture. -Treatment options discussed including all alternatives, risks, and complications -I discussed with the patient surgical intervention however this is a very small bone spur. She has never had that nail border removed. I discussed with her nail debridement versus partial nail avulsion however she wishes to hold off on a nail avulsion and just debridement at this time. If in the future she continues to have recurring symptoms can perform a partial nail avulsion. If symptoms continue despite that may need surgical intervention however I recommended to hold off for now. Also given her strong history of week reoccurring cellulitis that  leg is noted increased chance of infection for elective foot surgery. -Nail sharply debrided 10 without complication/bleeding. -Follow-up in 3 months or sooner if any problems arise. In the meantime, encouraged to call the office  with any questions, concerns, change in symptoms.   Celesta Gentile, DPM

## 2015-01-02 ENCOUNTER — Ambulatory Visit: Payer: Medicare HMO | Admitting: Podiatry

## 2015-01-09 DIAGNOSIS — Z872 Personal history of diseases of the skin and subcutaneous tissue: Secondary | ICD-10-CM | POA: Insufficient documentation

## 2015-01-09 DIAGNOSIS — I83893 Varicose veins of bilateral lower extremities with other complications: Secondary | ICD-10-CM | POA: Insufficient documentation

## 2015-01-09 DIAGNOSIS — E782 Mixed hyperlipidemia: Secondary | ICD-10-CM | POA: Insufficient documentation

## 2015-01-09 DIAGNOSIS — M170 Bilateral primary osteoarthritis of knee: Secondary | ICD-10-CM | POA: Insufficient documentation

## 2015-04-16 ENCOUNTER — Ambulatory Visit (INDEPENDENT_AMBULATORY_CARE_PROVIDER_SITE_OTHER): Payer: Medicare HMO | Admitting: Podiatry

## 2015-04-16 ENCOUNTER — Encounter: Payer: Self-pay | Admitting: Podiatry

## 2015-04-16 DIAGNOSIS — L600 Ingrowing nail: Secondary | ICD-10-CM

## 2015-04-16 DIAGNOSIS — M79673 Pain in unspecified foot: Secondary | ICD-10-CM

## 2015-04-16 DIAGNOSIS — B351 Tinea unguium: Secondary | ICD-10-CM

## 2015-04-16 DIAGNOSIS — R52 Pain, unspecified: Secondary | ICD-10-CM

## 2015-04-16 NOTE — Progress Notes (Signed)
Patient ID: [name] Bridges, female   DOB: 05/27/1940, 75 y.o.   MRN: [id number]  Subjective: 75 y.o. returns the office today for painful, elongated, thickened toenails which she cannot trim herself. Denies any redness or drainage around the nails. Denies any acute changes since last appointment and no new complaints today. Denies any systemic complaints such as fevers, chills, nausea, vomiting.   Objective: AAO 3, NAD DP/PT pulses palpable, CRT less than 3 seconds Nails hypertrophic, dystrophic, elongated, brittle, discolored 10. There does appear to be some incurvation of both big toes on the distal aspect. There is no drainage or pus. No edema, erythema. There is tenderness overlying the nails 1-5 bilaterally. There is no surrounding erythema or drainage along the nail sites. No open lesions or pre-ulcerative lesions are identified. No other areas of tenderness bilateral lower extremities. No overlying edema, erythema, increased warmth. No pain with calf compression, swelling, warmth, erythema.  Assessment: Patient presents with symptomatic onychomycosis, ingrown toenail  Plan: -Treatment options including alternatives, risks, complications were discussed -Nails sharply debrided 10 without complication/bleeding. -Discussed daily foot inspection. If there are any changes, to call the office immediately.  -Follow-up in 3 months or sooner if any problems are to arise. In the meantime, encouraged to call the office with any questions, concerns, changes symptoms.  Celesta Gentile, DPM

## 2015-07-13 ENCOUNTER — Ambulatory Visit (INDEPENDENT_AMBULATORY_CARE_PROVIDER_SITE_OTHER): Payer: Medicare HMO | Admitting: Podiatry

## 2015-07-13 ENCOUNTER — Encounter: Payer: Self-pay | Admitting: Podiatry

## 2015-07-13 DIAGNOSIS — R52 Pain, unspecified: Secondary | ICD-10-CM

## 2015-07-13 DIAGNOSIS — M79676 Pain in unspecified toe(s): Secondary | ICD-10-CM | POA: Diagnosis not present

## 2015-07-13 DIAGNOSIS — B351 Tinea unguium: Secondary | ICD-10-CM

## 2015-07-13 NOTE — Progress Notes (Signed)
Patient ID: [name] Bridges, female   DOB: 05/27/1940, 75 y.o.   MRN: [id number]  Subjective: 75 y.o. returns the office today for painful, elongated, thickened toenails which she cannot trim herself. Denies any redness or drainage around the nails. Denies any acute changes since last appointment and no new complaints today. Denies any systemic complaints such as fevers, chills, nausea, vomiting.   Objective: AAO 3, NAD DP/PT pulses palpable, CRT less than 3 seconds Nails hypertrophic, dystrophic, elongated, brittle, discolored 10. There does appear to be some incurvation of both big toes on the distal aspect. There is no drainage or pus. No edema, erythema. There is tenderness overlying the nails 1-5 bilaterally. There is no surrounding erythema or drainage along the nail sites. No open lesions or pre-ulcerative lesions are identified. No other areas of tenderness bilateral lower extremities. No overlying edema, erythema, increased warmth. No pain with calf compression, swelling, warmth, erythema.  Assessment: Patient presents with symptomatic onychomycosis, ingrown toenail  Plan: -Treatment options including alternatives, risks, complications were discussed -Nails sharply debrided 10 without complication/bleeding. -Discussed daily foot inspection. If there are any changes, to call the office immediately.  -Follow-up in 3 months or sooner if any problems are to arise. In the meantime, encouraged to call the office with any questions, concerns, changes symptoms.  Celesta Gentile, DPM

## 2015-09-10 ENCOUNTER — Ambulatory Visit (INDEPENDENT_AMBULATORY_CARE_PROVIDER_SITE_OTHER): Payer: Medicare HMO | Admitting: Podiatry

## 2015-09-10 ENCOUNTER — Encounter: Payer: Self-pay | Admitting: Podiatry

## 2015-09-10 DIAGNOSIS — L600 Ingrowing nail: Secondary | ICD-10-CM

## 2015-09-10 MED ORDER — CEPHALEXIN 500 MG PO CAPS: 500.0000 mg | ORAL_CAPSULE | Freq: Three times a day (TID) | ORAL | Status: AC

## 2015-09-10 NOTE — Progress Notes (Signed)
Patient ID: [name] Bridges, female   DOB: 04-25-1940, 75 y.o.   MRN: [id number]  Subjective: 75 year old female presents the office today for concerns of ingrown toenail to the left big toe on the outside, lateral, nail border. She stairs painful with pressure in shoes. Denies a drainage or pus or any redness. She does get swelling on the toenail. No other concerns this time.Denies any systemic complaints such as fevers, chills, nausea, vomiting. No acute changes since last appointment, and no other complaints at this time.   Objective: AAO x3, NAD DP/PT pulses palpable bilaterally, CRT less than 3 seconds Incurvation along the medial and lateral nail borders left hallux toenail however there is only tenderness on the lateral nail border. There is localized edema to this area no surrounding erythema or ascending synovitis. No fluctuance or crepitus. There is no malodor. No pus or drainage. No areas of pinpoint bony tenderness or pain with vibratory sensation. MMT 5/5, ROM WNL. No edema, erythema, increase in warmth to bilateral lower extremities.  No open lesions or pre-ulcerative lesions.  No pain with calf compression, swelling, warmth, erythema  Assessment: Symptomatic Ingrown toenail left lateral hallux toenail.  Plan: -All treatment options discussed with the patient including all alternatives, risks, complications.  -At this time, the patient is requesting partial nail removal with chemical matricectomy to the symptomatic portion of the nail. She does not want to have the medial nail border removed. Risks and complications were discussed with the patient for which they understand and  verbally consent to the procedure. Under sterile conditions a total of 3 mL of a mixture of 2% lidocaine plain and 0.5% Marcaine plain was infiltrated in a hallux block fashion. Once anesthetized, the skin was prepped in sterile fashion. A tourniquet was then applied. Next the lateral aspect of hallux nail  border was then sharply excised making sure to remove the entire offending nail border. Once the nails were ensured to be removed area was debrided and the underlying skin was intact. There is no purulence identified in the procedure. Next phenol was then applied under standard conditions and copiously irrigated. Silvadene was applied. A dry sterile dressing was applied. After application of the dressing the tourniquet was removed and there is found to be an immediate capillary refill time to the digit. The patient tolerated the procedure well any complications. Post procedure instructions were discussed the patient for which he verbally understood. Follow-up in one week for nail check or sooner if any problems are to arise. Discussed signs/symptoms of infection and directed to call the office immediately should any occur or go directly to the emergency room. In the meantime, encouraged to call the office with any questions, concerns, changes symptoms. -Keflex -Patient encouraged to call the office with any questions, concerns, change in symptoms.   Celesta Gentile, DPM

## 2015-09-10 NOTE — Patient Instructions (Signed)

## 2015-09-24 ENCOUNTER — Encounter: Payer: Self-pay | Admitting: Podiatry

## 2015-09-24 ENCOUNTER — Ambulatory Visit (INDEPENDENT_AMBULATORY_CARE_PROVIDER_SITE_OTHER): Payer: Medicare HMO | Admitting: Podiatry

## 2015-09-24 DIAGNOSIS — L600 Ingrowing nail: Secondary | ICD-10-CM | POA: Diagnosis not present

## 2015-09-24 NOTE — Patient Instructions (Signed)

## 2015-09-24 NOTE — Progress Notes (Signed)
Subjective: 75 year old female presents the office today for follow-up evaluation of her left big toenail status post lateral partial nail avulsion. She said this is doing well however she started to have pain to the medial side is Somewhat Swollen. Denies Any Drainage or Pus. Sharp to Go Ahead and Have the Medial Side Removal She Did Lateral. She Also States That Her Right Big Toe Started Become Somewhat Painful As It Is Ingrown to the Tip but This Is Minimal Tenderness. She'll to Have a Right Big Toenail from the This Time. No Drainage or Pus, the Toenail. Denies any systemic complaints such as fevers, chills, nausea, vomiting. No acute changes since last appointment, and no other complaints at this time.   Objective: AAO x3, NAD DP/PT pulses palpable bilaterally, CRT less than 3 seconds There's incurvation along the medial aspect of the left hallux toenail tenderness palpation. There is localized edema and some faint erythema without any ascending synovitis. No fluctuance or crepitus. There is no malodor. Status post lateral partial nail avulsion left hallux which is healing well the scab. No tenderness or drainage. On the right hallux toe nail is incurvation of both the medial lateral nail borders. No edema, erythema, drainage or other signs of infection. No edema, erythema, increase in warmth to bilateral lower extremities.  No open lesions or pre-ulcerative lesions.  No pain with calf compression, swelling, warmth, erythema  Assessment: Left medial hallux ingrown toenail; right hallux ingrown toenail  Plan: -All treatment options discussed with the patient including all alternatives, risks, complications.  -At this time, the patient is requesting partial nail removal with chemical matricectomy to the symptomatic portion of the nail. Risks and complications were discussed with the patient for which they understand and  verbally consent to the procedure. Under sterile conditions a total of 3 mL  of a mixture of 2% lidocaine plain and 0.5% Marcaine plain was infiltrated in a hallux block fashion. Once anesthetized, the skin was prepped in sterile fashion. A tourniquet was then applied. Next the medial aspect of hallux nail border was then sharply excised making sure to remove the entire offending nail border. Once the nails were ensured to be removed area was debrided and the underlying skin was intact. There is no purulence identified in the procedure. Next phenol was then applied under standard conditions and copiously irrigated. Silvadene was applied. A dry sterile dressing was applied. After application of the dressing the tourniquet was removed and there is found to be an immediate capillary refill time to the digit. The patient tolerated the procedure well any complications. Post procedure instructions were discussed the patient for which he verbally understood. Follow-up in 2 weeks for nail check or sooner if any problems are to arise. Discussed signs/symptoms of infection and directed to call the office immediately should any occur or go directly to the emergency room. In the meantime, encouraged to call the office with any questions, concerns, changes symptoms. -Right hallux toenail debrided without couple complications or bleeding. If is any increasing pain or symptoms partial nail avulsion in the future. -Patient encouraged to call the office with any questions, concerns, change in symptoms.   Celesta Gentile, DPM

## 2015-10-19 ENCOUNTER — Ambulatory Visit (INDEPENDENT_AMBULATORY_CARE_PROVIDER_SITE_OTHER): Payer: Medicare HMO | Admitting: Podiatry

## 2015-10-19 ENCOUNTER — Encounter: Payer: Self-pay | Admitting: Podiatry

## 2015-10-19 DIAGNOSIS — R52 Pain, unspecified: Secondary | ICD-10-CM

## 2015-10-19 DIAGNOSIS — M79676 Pain in unspecified toe(s): Secondary | ICD-10-CM | POA: Diagnosis not present

## 2015-10-19 DIAGNOSIS — B351 Tinea unguium: Secondary | ICD-10-CM | POA: Diagnosis not present

## 2015-10-21 NOTE — Progress Notes (Signed)
Patient ID: [name] Bridges, female   DOB: October 08, 1940, 75 y.o.   MRN: [id number]  Subjective: 75 y.o. returns the office today for painful, elongated, thickened toenails which she cannot trim herself. Denies any redness or drainage around the nails. She also presents today for follow-up evaluation status post left partial nail avulsion. She states that she is doing well she's had no drainage, pus or any redness or any pain. She's been soaking in Epson salts currently in about ointment and a Band-Aid up into the last week. Denies any acute changes since last appointment and no new complaints today. Denies any systemic complaints such as fevers, chills, nausea, vomiting.   Objective: AAO 3, NAD DP/PT pulses palpable, CRT less than 3 seconds Nails hypertrophic, dystrophic, elongated, brittle, discolored 9. There is tenderness overlying the nails 1-5 bilaterally except for the left hallux. Left hallux toenail status post partial nail avulsion which is healing well and scab is formed to the area and there is no drainage or pus or any surrounding edema, erythema. No clinical signs of infection.. There is no surrounding erythema or drainage along the nail sites. No open lesions or pre-ulcerative lesions are identified. No other areas of tenderness bilateral lower extremities. No overlying edema, erythema, increased warmth. No pain with calf compression, swelling, warmth, erythema.  Assessment: Patient presents with symptomatic onychomycosis, ingrown toenail  Plan: -Treatment options including alternatives, risks, complications were discussed -Nails sharply debrided 9 without complication/bleeding. -Continue antibiotic ointment and a bandage to the nail procedure site. At any time there is any issues to call the office. Much for infection -Discussed daily foot inspection. If there are any changes, to call the office immediately.  -Follow-up in 3 months or sooner if any problems are to arise. In the  meantime, encouraged to call the office with any questions, concerns, changes symptoms.  Celesta Gentile, DPM

## 2016-01-25 ENCOUNTER — Encounter: Payer: Self-pay | Admitting: Podiatry

## 2016-01-25 ENCOUNTER — Ambulatory Visit (INDEPENDENT_AMBULATORY_CARE_PROVIDER_SITE_OTHER): Payer: Medicare HMO | Admitting: Podiatry

## 2016-01-25 DIAGNOSIS — B351 Tinea unguium: Secondary | ICD-10-CM

## 2016-01-25 DIAGNOSIS — R52 Pain, unspecified: Secondary | ICD-10-CM

## 2016-01-28 NOTE — Progress Notes (Signed)
Patient ID: [name] Bridges, female   DOB: 08/24/1940, 75 y.o.   MRN: [id number]  Subjective: 75 y.o. returns the office today for painful, elongated, thickened toenails which she cannot trim herself. Denies any redness or drainage around the nails.Denies any acute changes since last appointment and no new complaints today. Denies any systemic complaints such as fevers, chills, nausea, vomiting.   Objective: AAO 3, NAD DP/PT pulses palpable, CRT less than 3 seconds Nails hypertrophic, dystrophic, elongated, brittle, discolored 10. There is tenderness overlying the nails 1-5 bilaterally. Left hallux toenail status post partial nail avulsion is doing well and is healed without clinical signs of infection.There is no surrounding erythema or drainage along the nail sites. No open lesions or pre-ulcerative lesions are identified. No other areas of tenderness bilateral lower extremities. No overlying edema, erythema, increased warmth. No pain with calf compression, swelling, warmth, erythema.  Assessment: Patient presents with symptomatic onychomycosis  Plan: -Treatment options including alternatives, risks, complications were discussed -Nails sharply debrided 10 without complication/bleeding. -Discussed daily foot inspection. If there are any changes, to call the office immediately.  -Follow-up in 3 months or sooner if any problems are to arise. In the meantime, encouraged to call the office with any questions, concerns, changes symptoms.  Celesta Gentile, DPM

## 2016-04-25 ENCOUNTER — Ambulatory Visit (INDEPENDENT_AMBULATORY_CARE_PROVIDER_SITE_OTHER): Payer: Medicare HMO | Admitting: Podiatry

## 2016-04-25 ENCOUNTER — Encounter: Payer: Self-pay | Admitting: Podiatry

## 2016-04-25 VITALS — BP 167/70 | HR 85 | Resp 18

## 2016-04-25 DIAGNOSIS — R52 Pain, unspecified: Secondary | ICD-10-CM | POA: Diagnosis not present

## 2016-04-25 DIAGNOSIS — B351 Tinea unguium: Secondary | ICD-10-CM

## 2016-04-25 NOTE — Progress Notes (Signed)
Patient ID: [name] Bridges, female   DOB: 1941/01/06, 76 y.o.   MRN: [id number]  Subjective: 76 y.o. returns the office today for painful, elongated, thickened toenails which she cannot trim herself. Denies any redness or drainage around the nails.Denies any acute changes since last appointment and no new complaints today. Denies any systemic complaints such as fevers, chills, nausea, vomiting.   Objective: AAO 3, NAD DP/PT pulses palpable, CRT less than 3 seconds Nails hypertrophic, dystrophic, elongated, brittle, discolored 10. There is tenderness overlying the nails 1-5 bilaterally. Left hallux toenail status post partial nail avulsion is doing well and is healed without clinical signs of infection.There is no surrounding erythema or drainage along the nail sites. No open lesions or pre-ulcerative lesions are identified. No other areas of tenderness bilateral lower extremities. No overlying edema, erythema, increased warmth. No pain with calf compression, swelling, warmth, erythema.  Assessment: Patient presents with symptomatic onychomycosis  Plan: -Treatment options including alternatives, risks, complications were discussed -Nails sharply debrided 10 without complication/bleeding. -Discussed daily foot inspection. If there are any changes, to call the office immediately.  -Follow-up in 3 months or sooner if any problems are to arise. In the meantime, encouraged to call the office with any questions, concerns, changes symptoms.  Celesta Gentile, DPM

## 2016-07-24 ENCOUNTER — Encounter: Payer: Self-pay | Admitting: Podiatry

## 2016-07-24 ENCOUNTER — Ambulatory Visit (INDEPENDENT_AMBULATORY_CARE_PROVIDER_SITE_OTHER): Payer: Medicare HMO | Admitting: Podiatry

## 2016-07-24 DIAGNOSIS — R52 Pain, unspecified: Secondary | ICD-10-CM | POA: Diagnosis not present

## 2016-07-24 DIAGNOSIS — L600 Ingrowing nail: Secondary | ICD-10-CM

## 2016-07-24 DIAGNOSIS — B351 Tinea unguium: Secondary | ICD-10-CM

## 2016-07-24 NOTE — Progress Notes (Signed)
Patient ID: [name] Bridges, female   DOB: [birth year], 76 y.o.   MRN: [id number]  Subjective: 76 y.o. returns the office today for painful, elongated, thickened toenails which she cannot trim herself. Denies any redness or drainage around the nails.Denies any acute changes since last appointment and no new complaints today. Denies any systemic complaints such as fevers, chills, nausea, vomiting.   Objective: AAO 3, NAD DP/PT pulses palpable, CRT less than 3 seconds Nails hypertrophic, dystrophic, elongated, brittle, discolored 10. There is tenderness overlying the nails 1-5 bilaterally. No surrounding redness or drainage. There is mild incurvation of the right hallux toenail, lateral border without any drainage, or signs of infection.  No other areas of tenderness bilateral lower extremities. No overlying edema, erythema, increased warmth. No pain with calf compression, swelling, warmth, erythema.  Assessment: Patient presents with symptomatic onychomycosis, ingrown toenail  Plan: -Treatment options including alternatives, risks, complications were discussed -Nails sharply debrided 10 without complication/bleeding. Able to remove part of the right hallux toenail that was causing issues.  -If symptoms continue may need partial nail avulsion on the right hallux -Discussed daily foot inspection. If there are any changes, to call the office immediately.  -Follow-up in 3 months or sooner if any problems are to arise. In the meantime, encouraged to call the office with any questions, concerns, changes symptoms.  Celesta Gentile, DPM

## 2016-10-23 ENCOUNTER — Encounter: Payer: Self-pay | Admitting: Podiatry

## 2016-10-23 ENCOUNTER — Ambulatory Visit (INDEPENDENT_AMBULATORY_CARE_PROVIDER_SITE_OTHER): Payer: Medicare HMO | Admitting: Podiatry

## 2016-10-23 DIAGNOSIS — M79675 Pain in left toe(s): Secondary | ICD-10-CM

## 2016-10-23 DIAGNOSIS — M79674 Pain in right toe(s): Secondary | ICD-10-CM | POA: Diagnosis not present

## 2016-10-23 DIAGNOSIS — B351 Tinea unguium: Secondary | ICD-10-CM | POA: Diagnosis not present

## 2016-10-28 NOTE — Progress Notes (Signed)
Patient ID: [name] Bridges, female   DOB: Feb 15, 1941, 76 y.o.   MRN: [id number] Subjective: 76 y.o. returns the office today for painful, elongated, thickened toenails which she cannot trim herself. Denies any redness or drainage around the nails. Denies any acute changes since last appointment and no new complaints today. Denies any systemic complaints such as fevers, chills, nausea, vomiting.   Objective: AAO 3, NAD DP/PT pulses palpable, CRT less than 3 seconds Nails hypertrophic, dystrophic, elongated, brittle, discolored 10. There is tenderness overlying the nails 1-5 bilaterally. No surrounding redness or drainage. There is mild incurvation of the right hallux toenail, lateral border without any drainage, or signs of infection.  On the anterior aspect of the right leg there is a bandage. She states she occasionally gets a wound to her legs and that she is currently being treated by another physician for this and she does not wan to remove her stockings/bandage.  No other areas of tenderness bilateral lower extremities. No overlying edema, erythema, increased warmth. No pain with calf compression, swelling, warmth, erythema.  Assessment: Patient presents with symptomatic onychomycosis  Plan: -Treatment options including alternatives, risks, complications were discussed -Nails sharply debrided 10 without complications/bleeding. -Continue follow-up with other physician for the wound on the right leg.  -Discussed daily foot inspection. If there are any changes, to call the office immediately.  -Follow-up in 3 months or sooner if any problems are to arise. In the meantime, encouraged to call the office with any questions, concerns, changes symptoms.  Celesta Gentile, DPM

## 2017-01-22 ENCOUNTER — Ambulatory Visit: Payer: Medicare HMO | Admitting: Podiatry

## 2017-01-22 DIAGNOSIS — M79674 Pain in right toe(s): Secondary | ICD-10-CM | POA: Diagnosis not present

## 2017-01-22 DIAGNOSIS — B351 Tinea unguium: Secondary | ICD-10-CM | POA: Diagnosis not present

## 2017-01-22 DIAGNOSIS — L600 Ingrowing nail: Secondary | ICD-10-CM

## 2017-01-22 DIAGNOSIS — M79675 Pain in left toe(s): Secondary | ICD-10-CM | POA: Diagnosis not present

## 2017-01-22 NOTE — Progress Notes (Signed)
Patient ID: [name] Bridges, female   DOB: 01-Oct-1940, 76 y.o.   MRN: [id number] Subjective: 76 y.o. returns the office today for painful, elongated, thickened toenails which she cannot trim herself.she states that her right big toe she starting an ingrown toenail, points to the lateral aspect.  She states that she also did hit it in the toe did bleed some but denies any pus or redness.  The area is painful to pressure in shoes.  Denies any acute changes since last appointment and no new complaints today. Denies any systemic complaints such as fevers, chills, nausea, vomiting.   Objective: AAO 3, NAD DP/PT pulses palpable, CRT less than 3 seconds Nails hypertrophic, dystrophic, elongated, brittle, discolored 10.  There is incurvation present to the lateral aspect of the right hallux toenail to the distal aspect of the nail there is evidence of old dried blood but there is no pus.  After debridement of the area today there was resolution of pain and there is no drainage or pus or any other signs of infection noted.  There is tenderness overlying the nails 1-5 bilaterally. No surrounding redness or drainage. There is mild incurvation of the right hallux toenail, lateral border without any drainage, or signs of infection.   No other areas of tenderness bilateral lower extremities. No overlying edema, erythema, increased warmth. No pain with calf compression, swelling, warmth, erythema.  Assessment: Patient presents with symptomatic onychomycosis; right hallux ingrown toenail  Plan: -Treatment options including alternatives, risks, complications were discussed -Nails sharply debrided 10 without complications/bleeding. I sharply debrided the right hallux toenail to remove the symptomatic portion of the ingrowing nail.  After debridement there is resolution of symptoms but continue to monitor for any recurrence or signs of infection.- 9 weeks-Discussed daily foot inspection. If there are any changes,  to call the office immediately.  -Follow-up in 9 weeks or sooner if any problems are to arise. In the meantime, encouraged to call the office with any questions, concerns, changes symptoms.  Celesta Gentile, DPM

## 2017-04-02 ENCOUNTER — Encounter: Payer: Self-pay | Admitting: Podiatry

## 2017-04-02 ENCOUNTER — Ambulatory Visit: Payer: Medicare HMO | Admitting: Podiatry

## 2017-04-02 DIAGNOSIS — M79675 Pain in left toe(s): Secondary | ICD-10-CM

## 2017-04-02 DIAGNOSIS — M79674 Pain in right toe(s): Secondary | ICD-10-CM

## 2017-04-02 DIAGNOSIS — B351 Tinea unguium: Secondary | ICD-10-CM

## 2017-04-02 DIAGNOSIS — L600 Ingrowing nail: Secondary | ICD-10-CM

## 2017-04-02 NOTE — Progress Notes (Signed)
Patient ID: [name] Bridges, female   DOB: 04-Mar-1940, 77 y.o.   MRN: [id number] Subjective: 77 y.o. returns the office today for painful, elongated, thickened toenails which she cannot trim herself.she states that her right big toe she starting an ingrown toenail, points to the lateral aspect.  After trimming the nail last appointment it looked better but since it has come back..  Denies any acute changes since last appointment and no new complaints today. Denies any systemic complaints such as fevers, chills, nausea, vomiting.   Objective: AAO 3, NAD DP/PT pulses palpable, CRT less than 3 seconds Nails hypertrophic, dystrophic, elongated, brittle, discolored 10.  There is incurvation present to the lateral aspect of the right hallux toenail to the distal aspect of the nail there is evidence of old dried blood but there is no pus.  After debridement of the area today there was resolution of pain and there is no drainage or pus or any other signs of infection noted.  There is tenderness overlying the nails 1-5 bilaterally. No surrounding redness or drainage. There is mild incurvation of the right hallux toenail, lateral border without any drainage, or signs of infection.  This appears the same as last appointment.  No other areas of tenderness bilateral lower extremities. No overlying edema, erythema, increased warmth. No pain with calf compression, swelling, warmth, erythema.  Assessment: Patient presents with symptomatic onychomycosis; right hallux ingrown toenail  Plan: -Treatment options including alternatives, risks, complications were discussed -Nails sharply debrided 10 without complications/bleeding. -I sharply debrided the right hallux toenail to remove the symptomatic portion of the ingrowing nail.  After debridement there is resolution of symptoms but continue to monitor for any recurrence or signs of infection. Discussed AP nail and will consider this next appointment.  -Discussed  daily foot inspection. If there are any changes, to call the office immediately.  -Follow-up in 9 weeks or sooner if any problems are to arise. In the meantime, encouraged to call the office with any questions, concerns, changes symptoms.  Celesta Gentile, DPM

## 2017-05-12 DIAGNOSIS — G894 Chronic pain syndrome: Secondary | ICD-10-CM | POA: Insufficient documentation

## 2017-06-30 ENCOUNTER — Ambulatory Visit: Payer: Medicare HMO | Admitting: Podiatry

## 2017-06-30 ENCOUNTER — Encounter: Payer: Self-pay | Admitting: Podiatry

## 2017-06-30 DIAGNOSIS — M79675 Pain in left toe(s): Secondary | ICD-10-CM | POA: Diagnosis not present

## 2017-06-30 DIAGNOSIS — B351 Tinea unguium: Secondary | ICD-10-CM | POA: Diagnosis not present

## 2017-06-30 DIAGNOSIS — M79674 Pain in right toe(s): Secondary | ICD-10-CM | POA: Diagnosis not present

## 2017-07-01 NOTE — Progress Notes (Signed)
Patient ID: [name] Bridges, female   DOB: [birth year], 77 y.o.   MRN: [id number] Subjective: 77 y.o. returns the office today for painful, elongated, thickened toenails which she cannot trim herself.  She says the right big toe is not as ingrown as what it has been and she was to hold off on a nail procedure.  She also has a wound on the right leg which she is under the active care of another physician for this.  She does not want to change in mood today.  Denies any acute changes since last appointment and no new complaints today. Denies any systemic complaints such as fevers, chills, nausea, vomiting.   Objective: AAO 3, NAD DP/PT pulses palpable, CRT less than 3 seconds Nails hypertrophic, dystrophic, elongated, brittle, discolored 10.  There is minimal incurvation present to the lateral aspect of the right hallux toenail although does appear to be improved compared to last appointment.  There is no surrounding redness or drainage or any signs of infection.  Tenderness nails 1-5 bilaterally. There is a bandage on the right leg that she does not want removed.  Will defer treatment to the other physician for this.   No other areas of tenderness bilateral lower extremities. No overlying edema, erythema, increased warmth. No pain with calf compression, swelling, warmth, erythema.  Assessment: Patient presents with symptomatic onychomycosis  Plan: -Treatment options including alternatives, risks, complications were discussed -Nails sharply debrided 10 without complications/bleeding. -We will defer treatment of the wound to the other physician.  Continue with compression socks as well. -Discussed daily foot inspection. If there are any changes, to call the office immediately.  -Follow-up in 9 weeks or sooner if any problems are to arise. In the meantime, encouraged to call the office with any questions, concerns, changes symptoms.  Celesta Gentile, DPM

## 2017-10-06 ENCOUNTER — Encounter: Payer: Self-pay | Admitting: Podiatry

## 2017-10-06 ENCOUNTER — Ambulatory Visit: Payer: Medicare HMO | Admitting: Podiatry

## 2017-10-06 DIAGNOSIS — M79674 Pain in right toe(s): Secondary | ICD-10-CM

## 2017-10-06 DIAGNOSIS — B351 Tinea unguium: Secondary | ICD-10-CM

## 2017-10-06 DIAGNOSIS — M79675 Pain in left toe(s): Secondary | ICD-10-CM

## 2017-10-07 NOTE — Progress Notes (Signed)
Patient ID: [name] Bridges, female   DOB: 08-Jan-1941, 77 y.o.   MRN: [id number] Subjective: 77 y.o. returns the office today for painful, elongated, thickened toenails which she cannot trim herself. Denies any acute changes since last appointment and no new complaints today. Denies any systemic complaints such as fevers, chills, nausea, vomiting.   Objective: AAO 3, NAD DP/PT pulses palpable, CRT less than 3 seconds Nails hypertrophic, dystrophic, elongated, brittle, discolored 10.  There is minimal incurvation present to the lateral aspect of the right hallux toenail although does appear to be improved compared to last appointment.  There is no surrounding redness or drainage or any signs of infection.  Tenderness nails 1-5 bilaterally. Chronic bilateral swelling, unchanged.  No wounds on the feet.  No pain with calf compression, swelling, warmth, erythema.  Assessment: Patient presents with symptomatic onychomycosis  Plan: -Treatment options including alternatives, risks, complications were discussed -Nails sharply debrided 10 without complications/bleeding. -Continue compression stockings.  -Discussed daily foot inspection. If there are any changes, to call the office immediately.  -Follow-up in 9 weeks or sooner if any problems are to arise. In the meantime, encouraged to call the office with any questions, concerns, changes symptoms.  Celesta Gentile, DPM

## 2018-01-06 ENCOUNTER — Ambulatory Visit: Payer: Medicare HMO | Admitting: Podiatry

## 2018-01-06 ENCOUNTER — Encounter: Payer: Self-pay | Admitting: Podiatry

## 2018-01-06 DIAGNOSIS — M79674 Pain in right toe(s): Secondary | ICD-10-CM

## 2018-01-06 DIAGNOSIS — B351 Tinea unguium: Secondary | ICD-10-CM

## 2018-01-06 DIAGNOSIS — M79675 Pain in left toe(s): Secondary | ICD-10-CM

## 2018-01-06 NOTE — Patient Instructions (Signed)

## 2018-01-24 NOTE — Progress Notes (Signed)
Subjective: [name] Bridges presents today with elongated, painful, thick toenails 1-5 b/l that she cannot cut and which interfere with daily activities.  Pain is aggravated when wearing enclosed shoe gear.  Objective: Vascular Examination: Capillary refill time less than 3 seconds to all 10 digits Dorsalis pedis and posterior tibial pulses palpable bilaterally Digital hair x 10 digits absent  Skin temperature gradient within normal limits bilaterally Chronic lower extremity edema with venous stasis changes noted bilaterally with no open wounds.  Dermatological Examination: Skin with normal turgor texture and tone bilaterally  Toenails 1-5 b/l discolored, thick, dystrophic with subungual debris and pain with palpation to nailbeds due to thickness of nails.  As noted above chronic venous stasis changes noted bilateral lower extremities  Musculoskeletal: Muscle strength 5/5 to all LE muscle groups  No pain with calf compression bilaterally  Neurological: Sensation intact with 10 gram monofilament. Vibratory sensation intact.  Assessment: Painful onychomycosis toenails 1-5 b/l   Plan: 1. Toenails 1-5 b/l were debrided in length and girth without iatrogenic bleeding. 2. Patient to continue soft, supportive shoe gear 3. Patient to report any pedal injuries to medical professional immediately. 4. Follow up 3 months. Patient/POA to call should there be a concern in the interim.

## 2018-03-22 ENCOUNTER — Encounter: Payer: Self-pay | Admitting: Internal Medicine

## 2018-04-08 ENCOUNTER — Ambulatory Visit: Payer: Medicare HMO | Admitting: Podiatry

## 2018-04-08 DIAGNOSIS — M79675 Pain in left toe(s): Secondary | ICD-10-CM

## 2018-04-08 DIAGNOSIS — M79674 Pain in right toe(s): Secondary | ICD-10-CM

## 2018-04-08 DIAGNOSIS — B351 Tinea unguium: Secondary | ICD-10-CM | POA: Diagnosis not present

## 2018-04-08 NOTE — Patient Instructions (Signed)

## 2018-04-12 ENCOUNTER — Encounter: Payer: Self-pay | Admitting: Podiatry

## 2018-04-12 NOTE — Progress Notes (Signed)
Subjective: [name] Bridges presents today with history of diabetes and chronic venous insufficiency with painful, thick toenails 1-5 b/l that she cannot cut and which interfere with daily activities.  Pain is aggravated when wearing enclosed shoe gear.  [name], MD is her PCP.   Current Outpatient Medications:  .  amoxicillin-clavulanate (AUGMENTIN) 875-125 MG per tablet, Take 1 tablet by mouth 2 (two) times daily., Disp: , Rfl:  .  aspirin EC 81 MG tablet, Take by mouth., Disp: , Rfl:  .  calcium carbonate (OSCAL) 1500 (600 Ca) MG TABS tablet, Take by mouth., Disp: , Rfl:  .  cephALEXin (KEFLEX) 500 MG capsule, Take 1 capsule (500 mg total) by mouth 3 (three) times daily., Disp: 30 capsule, Rfl: 2 .  diclofenac (CATAFLAM) 50 MG tablet, Take by mouth., Disp: , Rfl:  .  diclofenac (VOLTAREN) 50 MG EC tablet, Take 50 mg by mouth 2 (two) times daily., Disp: , Rfl:  .  doxycycline (VIBRA-TABS) 100 MG tablet, Take 1 tablet (100 mg total) by mouth every 12 (twelve) hours., Disp: 20 tablet, Rfl: 0 .  famotidine (PEPCID) 40 MG tablet, Take by mouth., Disp: , Rfl:  .  glucose blood test strip, CHECK GLUCOSE TOUR TIMES DAILY, Disp: , Rfl:  .  insulin NPH-regular Human (NOVOLIN 70/30) (70-30) 100 UNIT/ML injection, Inject 10-50 Units into the skin 3 (three) times daily. Takes 10 units in the morning and lunch, then 50 units with supper, Disp: , Rfl:  .  Insulin Syringe-Needle U-100 (INSULIN SYRINGE .5CC/30GX5/16") 30G X 5/16" 0.5 ML MISC, Use to inject 70/30 insulin bid   20 units qam and 50 units qpm, Disp: , Rfl:  .  ketorolac (TORADOL) 10 MG tablet, Take 1 tablet (10 mg total) by mouth every 8 (eight) hours as needed for pain., Disp: 20 tablet, Rfl: 0 .  Lancets MISC, Check glucose qid for frequent testing need for change of meds / insulin types to ensure no hypoglycemia or severe hyperglycemia, Disp: , Rfl:  .  meloxicam (MOBIC) 15 MG tablet, Take by mouth., Disp: , Rfl:  .  nystatin cream  (MYCOSTATIN), APPLY TO AFFECTED AREA TWICE A DAY, Disp: , Rfl:  .  olmesartan (BENICAR) 40 MG tablet, TAKE 1 TABLET BY MOUTH EVERY DAY, Disp: , Rfl:  .  polyethylene glycol (MIRALAX / GLYCOLAX) packet, Take 17 g by mouth daily as needed., Disp: 14 each, Rfl: 0 .  potassium gluconate 595 MG TABS, Take 595 mg by mouth every other day. , Disp: , Rfl:  .  Red Yeast Rice Extract (RED YEAST RICE PO), Take 120 mg by mouth every other day., Disp: , Rfl:  .  saccharomyces boulardii (FLORASTOR) 250 MG capsule, Take 1 capsule (250 mg total) by mouth 2 (two) times daily., Disp: 60 capsule, Rfl: 0 .  sodium chloride (OCEAN) 0.65 % SOLN nasal spray, Place 1 spray into both nostrils as needed for congestion., Disp: , Rfl:  .  torsemide (DEMADEX) 20 MG tablet, Take 40 mg by mouth every other day. , Disp: , Rfl:  .  traMADol (ULTRAM) 50 MG tablet, Take 1 tablet (50 mg total) by mouth every 8 (eight) hours as needed for pain., Disp: 60 tablet, Rfl: 0 .  valsartan (DIOVAN) 160 MG tablet, Take 160 mg by mouth daily with breakfast. , Disp: , Rfl:  .  VITAMIN D, ERGOCALCIFEROL, PO, Take by mouth., Disp: , Rfl:  .  water for irrigation, sterile SOLN, Use as directed for wound  care, Disp: , Rfl:   Allergies  Allergen Reactions  . Codeine Nausea And Vomiting and Other (See Comments)  . Other Nausea And Vomiting and Other (See Comments)  . Statins Other (See Comments)    Muscle spasms  Muscle spasms   . Sulfamethoxazole Other (See Comments), Hives and Nausea And Vomiting  . Sulfonamide Derivatives Nausea And Vomiting    Objective:  Vascular Examination: Capillary refill time less than 3 seconds x 10 digits  Dorsalis pedis and Posterior tibial pulses palpable b/l  Digital hair absent x 10 digits  Skin temperature gradient WNL b/l  Chronic lower extremity edema with venous stasis skin changes noted bilaterally.  No pain with calf compression.  Dermatological Examination: Skin with normal turgor, texture  and tone b/l  Toenails 1-5 b/l discolored, thick, dystrophic with subungual debris and pain with palpation to nailbeds due to thickness of nails.  Again chronic lower extremity edema with venous stasis skin changes noted bilaterally.  No open wounds.    No interdigital macerations noted.  Musculoskeletal: Muscle strength 5/5 to all LE muscle groups  No gross bony deformities b/l.  No pain, crepitus or joint limitation noted with ROM.   Neurological: Sensation intact with 10 gram monofilament.  Vibratory sensation intact.  Assessment: Painful onychomycosis toenails 1-5 b/l   Plan: 1. Toenails 1-5 b/l were debrided in length and girth without iatrogenic bleeding. 2. Patient to continue soft, supportive shoe gear. 3. Patient to report any pedal injuries to medical professional immediately. 4. Follow up 3 months.  5. Patient/POA to call should there be a concern in the interim.

## 2018-07-08 ENCOUNTER — Ambulatory Visit: Payer: Medicare HMO | Admitting: Podiatry

## 2018-07-28 ENCOUNTER — Ambulatory Visit: Payer: Medicare HMO | Admitting: Podiatry

## 2018-07-28 ENCOUNTER — Encounter: Payer: Self-pay | Admitting: Podiatry

## 2018-07-28 ENCOUNTER — Other Ambulatory Visit: Payer: Self-pay

## 2018-07-28 VITALS — Temp 97.3°F

## 2018-07-28 DIAGNOSIS — E1140 Type 2 diabetes mellitus with diabetic neuropathy, unspecified: Secondary | ICD-10-CM | POA: Diagnosis not present

## 2018-07-28 DIAGNOSIS — M79675 Pain in left toe(s): Secondary | ICD-10-CM

## 2018-07-28 DIAGNOSIS — M79674 Pain in right toe(s): Secondary | ICD-10-CM

## 2018-07-28 DIAGNOSIS — B351 Tinea unguium: Secondary | ICD-10-CM | POA: Diagnosis not present

## 2018-07-28 DIAGNOSIS — E119 Type 2 diabetes mellitus without complications: Secondary | ICD-10-CM

## 2018-07-28 NOTE — Patient Instructions (Signed)
Diabetes Mellitus and Foot Care  Foot care is an important part of your health, especially when you have diabetes. Diabetes may cause you to have problems because of poor blood flow (circulation) to your feet and legs, which can cause your skin to:   Become thinner and drier.   Break more easily.   Heal more slowly.   Peel and crack.  You may also have nerve damage (neuropathy) in your legs and feet, causing decreased feeling in them. This means that you may not notice minor injuries to your feet that could lead to more serious problems. Noticing and addressing any potential problems early is the best way to prevent future foot problems.  How to care for your feet  Foot hygiene   Wash your feet daily with warm water and mild soap. Do not use hot water. Then, pat your feet and the areas between your toes until they are completely dry. Do not soak your feet as this can dry your skin.   Trim your toenails straight across. Do not dig under them or around the cuticle. File the edges of your nails with an emery board or nail file.   Apply a moisturizing lotion or petroleum jelly to the skin on your feet and to dry, brittle toenails. Use lotion that does not contain alcohol and is unscented. Do not apply lotion between your toes.  Shoes and socks   Wear clean socks or stockings every day. Make sure they are not too tight. Do not wear knee-high stockings since they may decrease blood flow to your legs.   Wear shoes that fit properly and have enough cushioning. Always look in your shoes before you put them on to be sure there are no objects inside.   To break in new shoes, wear them for just a few hours a day. This prevents injuries on your feet.  Wounds, scrapes, corns, and calluses   Check your feet daily for blisters, cuts, bruises, sores, and redness. If you cannot see the bottom of your feet, use a mirror or ask someone for help.   Do not cut corns or calluses or try to remove them with medicine.   If you  find a minor scrape, cut, or break in the skin on your feet, keep it and the skin around it clean and dry. You may clean these areas with mild soap and water. Do not clean the area with peroxide, alcohol, or iodine.   If you have a wound, scrape, corn, or callus on your foot, look at it several times a day to make sure it is healing and not infected. Check for:  ? Redness, swelling, or pain.  ? Fluid or blood.  ? Warmth.  ? Pus or a bad smell.  General instructions   Do not cross your legs. This may decrease blood flow to your feet.   Do not use heating pads or hot water bottles on your feet. They may burn your skin. If you have lost feeling in your feet or legs, you may not know this is happening until it is too late.   Protect your feet from hot and cold by wearing shoes, such as at the beach or on hot pavement.   Schedule a complete foot exam at least once a year (annually) or more often if you have foot problems. If you have foot problems, report any cuts, sores, or bruises to your health care provider immediately.  Contact a health care provider if:     You have a medical condition that increases your risk of infection and you have any cuts, sores, or bruises on your feet.   You have an injury that is not healing.   You have redness on your legs or feet.   You feel burning or tingling in your legs or feet.   You have pain or cramps in your legs and feet.   Your legs or feet are numb.   Your feet always feel cold.   You have pain around a toenail.  Get help right away if:   You have a wound, scrape, corn, or callus on your foot and:  ? You have pain, swelling, or redness that gets worse.  ? You have fluid or blood coming from the wound, scrape, corn, or callus.  ? Your wound, scrape, corn, or callus feels warm to the touch.  ? You have pus or a bad smell coming from the wound, scrape, corn, or callus.  ? You have a fever.  ? You have a red line going up your leg.  Summary   Check your feet every day  for cuts, sores, red spots, swelling, and blisters.   Moisturize feet and legs daily.   Wear shoes that fit properly and have enough cushioning.   If you have foot problems, report any cuts, sores, or bruises to your health care provider immediately.   Schedule a complete foot exam at least once a year (annually) or more often if you have foot problems.  This information is not intended to replace advice given to you by your health care provider. Make sure you discuss any questions you have with your health care provider.  Document Released: 02/08/2000 Document Revised: 03/25/2017 Document Reviewed: 03/14/2016  Elsevier Interactive Patient Education  2019 [employer]

## 2018-08-01 NOTE — Progress Notes (Signed)
Subjective: [name] Bridges presents for preventative diabetic foot care today with painful, thick toenails 1-5 b/l that she cannot cut and which interfere with daily activities.  Pain is aggravated when wearing enclosed shoe gear.  [name] Noon, MD is her PCP.    Current Outpatient Medications:  .  amoxicillin-clavulanate (AUGMENTIN) 875-125 MG per tablet, Take 1 tablet by mouth 2 (two) times daily., Disp: , Rfl:  .  aspirin EC 81 MG tablet, Take by mouth., Disp: , Rfl:  .  calcium carbonate (OSCAL) 1500 (600 Ca) MG TABS tablet, Take by mouth., Disp: , Rfl:  .  cephALEXin (KEFLEX) 500 MG capsule, Take 1 capsule (500 mg total) by mouth 3 (three) times daily., Disp: 30 capsule, Rfl: 2 .  diclofenac (CATAFLAM) 50 MG tablet, Take by mouth., Disp: , Rfl:  .  diclofenac (VOLTAREN) 50 MG EC tablet, Take 50 mg by mouth 2 (two) times daily., Disp: , Rfl:  .  doxycycline (VIBRA-TABS) 100 MG tablet, Take 1 tablet (100 mg total) by mouth every 12 (twelve) hours., Disp: 20 tablet, Rfl: 0 .  famotidine (PEPCID) 40 MG tablet, Take by mouth., Disp: , Rfl:  .  glucose blood test strip, CHECK GLUCOSE TOUR TIMES DAILY, Disp: , Rfl:  .  insulin NPH-regular Human (NOVOLIN 70/30) (70-30) 100 UNIT/ML injection, Inject 10-50 Units into the skin 3 (three) times daily. Takes 10 units in the morning and lunch, then 50 units with supper, Disp: , Rfl:  .  Insulin Syringe-Needle U-100 (INSULIN SYRINGE .5CC/30GX5/16") 30G X 5/16" 0.5 ML MISC, Use to inject 70/30 insulin bid   20 units qam and 50 units qpm, Disp: , Rfl:  .  ketorolac (TORADOL) 10 MG tablet, Take 1 tablet (10 mg total) by mouth every 8 (eight) hours as needed for pain., Disp: 20 tablet, Rfl: 0 .  Lancets MISC, Check glucose qid for frequent testing need for change of meds / insulin types to ensure no hypoglycemia or severe hyperglycemia, Disp: , Rfl:  .  meloxicam (MOBIC) 15 MG tablet, Take by mouth., Disp: , Rfl:  .  nystatin cream (MYCOSTATIN), APPLY  TO AFFECTED AREA TWICE A DAY, Disp: , Rfl:  .  olmesartan (BENICAR) 40 MG tablet, TAKE 1 TABLET BY MOUTH EVERY DAY, Disp: , Rfl:  .  polyethylene glycol (MIRALAX / GLYCOLAX) packet, Take 17 g by mouth daily as needed., Disp: 14 each, Rfl: 0 .  potassium gluconate 595 MG TABS, Take 595 mg by mouth every other day. , Disp: , Rfl:  .  Red Yeast Rice Extract (RED YEAST RICE PO), Take 120 mg by mouth every other day., Disp: , Rfl:  .  saccharomyces boulardii (FLORASTOR) 250 MG capsule, Take 1 capsule (250 mg total) by mouth 2 (two) times daily., Disp: 60 capsule, Rfl: 0 .  sodium chloride (OCEAN) 0.65 % SOLN nasal spray, Place 1 spray into both nostrils as needed for congestion., Disp: , Rfl:  .  torsemide (DEMADEX) 20 MG tablet, Take 40 mg by mouth every other day. , Disp: , Rfl:  .  traMADol (ULTRAM) 50 MG tablet, Take 1 tablet (50 mg total) by mouth every 8 (eight) hours as needed for pain., Disp: 60 tablet, Rfl: 0 .  valsartan (DIOVAN) 160 MG tablet, Take 160 mg by mouth daily with breakfast. , Disp: , Rfl:  .  VITAMIN D, ERGOCALCIFEROL, PO, Take by mouth., Disp: , Rfl:  .  water for irrigation, sterile SOLN, Use as directed for wound care, Disp: ,  Rfl:   Allergies  Allergen Reactions  . Codeine Nausea And Vomiting and Other (See Comments)  . Other Nausea And Vomiting and Other (See Comments)  . Statins Other (See Comments)    Muscle spasms  Muscle spasms   . Sulfamethoxazole Other (See Comments), Hives and Nausea And Vomiting  . Sulfonamide Derivatives Nausea And Vomiting    Objective: Vitals:   07/28/18 1423  Temp: (!) 97.3 F (36.3 C)    Vascular Examination: Capillary refill time <3 seconds x 10 digits.  Dorsalis pedis and Posterior tibial pulses palpable b/l.  Digital hair present x 10 digits.  Skin temperature gradient WNL b/l.  Edema noted BLE. Wearing beige TED hose.  Dermatological Examination: Skin with normal turgor, texture and tone b/l.  Toenails 1-5 b/l  discolored, thick, dystrophic with subungual debris and pain with palpation to nailbeds due to thickness of nails.  Incurvated nailplate b/l great toes with tenderness to palpation. No erythema, no edema, no drainage noted.  Musculoskeletal: Muscle strength 5/5 to all LE muscle groups.  No gross bony deformities b/l.  No pain, crepitus or joint limitation noted with ROM.   Neurological: Sensation intact with 10 gram monofilament.  Vibratory sensation diminished b/l.  Proprioceptive sensation intact b/l.  Assessment: Painful onychomycosis toenails 1-5 b/l  NIDDM with sensory neuropathy Encounter for diabetic foot examination  Plan: 1. Encounter for diabetic foot examination today. 2. Toenails 1-5 b/l were debrided in length and girth without iatrogenic bleeding. Offending nail borders debrided and curretaged b/l great toes. Borders cleansed with alcohol. Antibiotic ointment applied. Light bleeding right great toe addressed with Lumicaine Hemostatic solution. Patient instructed to apply triple antibiotic ointment to digit once daily for one week.  3. Patient to continue soft, supportive shoe gear daily. 4. Patient to report any pedal injuries to medical professional immediately. 5. Follow up 3 months.  6. Patient/POA to call should there be a concern in the interim.

## 2018-09-08 DIAGNOSIS — N183 Chronic kidney disease, stage 3 unspecified: Secondary | ICD-10-CM | POA: Insufficient documentation

## 2018-09-08 DIAGNOSIS — G5622 Lesion of ulnar nerve, left upper limb: Secondary | ICD-10-CM | POA: Insufficient documentation

## 2018-09-08 DIAGNOSIS — Z79899 Other long term (current) drug therapy: Secondary | ICD-10-CM | POA: Insufficient documentation

## 2018-09-15 DIAGNOSIS — M180 Bilateral primary osteoarthritis of first carpometacarpal joints: Secondary | ICD-10-CM | POA: Insufficient documentation

## 2018-09-15 DIAGNOSIS — R200 Anesthesia of skin: Secondary | ICD-10-CM | POA: Insufficient documentation

## 2018-10-26 ENCOUNTER — Ambulatory Visit: Payer: Medicare HMO | Admitting: Podiatry

## 2018-10-26 ENCOUNTER — Encounter: Payer: Self-pay | Admitting: Podiatry

## 2018-10-26 ENCOUNTER — Other Ambulatory Visit: Payer: Self-pay

## 2018-10-26 DIAGNOSIS — B351 Tinea unguium: Secondary | ICD-10-CM | POA: Diagnosis not present

## 2018-10-26 DIAGNOSIS — M79675 Pain in left toe(s): Secondary | ICD-10-CM

## 2018-10-26 DIAGNOSIS — M79674 Pain in right toe(s): Secondary | ICD-10-CM

## 2018-10-26 NOTE — Patient Instructions (Signed)
Diabetes Mellitus and Foot Care Foot care is an important part of your health, especially when you have diabetes. Diabetes may cause you to have problems because of poor blood flow (circulation) to your feet and legs, which can cause your skin to:  Become thinner and drier.  Break more easily.  Heal more slowly.  Peel and crack. You may also have nerve damage (neuropathy) in your legs and feet, causing decreased feeling in them. This means that you may not notice minor injuries to your feet that could lead to more serious problems. Noticing and addressing any potential problems early is the best way to prevent future foot problems. How to care for your feet Foot hygiene  Wash your feet daily with warm water and mild soap. Do not use hot water. Then, pat your feet and the areas between your toes until they are completely dry. Do not soak your feet as this can dry your skin.  Trim your toenails straight across. Do not dig under them or around the cuticle. File the edges of your nails with an emery board or nail file.  Apply a moisturizing lotion or petroleum jelly to the skin on your feet and to dry, brittle toenails. Use lotion that does not contain alcohol and is unscented. Do not apply lotion between your toes. Shoes and socks  Wear clean socks or stockings every day. Make sure they are not too tight. Do not wear knee-high stockings since they may decrease blood flow to your legs.  Wear shoes that fit properly and have enough cushioning. Always look in your shoes before you put them on to be sure there are no objects inside.  To break in new shoes, wear them for just a few hours a day. This prevents injuries on your feet. Wounds, scrapes, corns, and calluses  Check your feet daily for blisters, cuts, bruises, sores, and redness. If you cannot see the bottom of your feet, use a mirror or ask someone for help.  Do not cut corns or calluses or try to remove them with medicine.  If you  find a minor scrape, cut, or break in the skin on your feet, keep it and the skin around it clean and dry. You may clean these areas with mild soap and water. Do not clean the area with peroxide, alcohol, or iodine.  If you have a wound, scrape, corn, or callus on your foot, look at it several times a day to make sure it is healing and not infected. Check for: ? Redness, swelling, or pain. ? Fluid or blood. ? Warmth. ? Pus or a bad smell. General instructions  Do not cross your legs. This may decrease blood flow to your feet.  Do not use heating pads or hot water bottles on your feet. They may burn your skin. If you have lost feeling in your feet or legs, you may not know this is happening until it is too late.  Protect your feet from hot and cold by wearing shoes, such as at the beach or on hot pavement.  Schedule a complete foot exam at least once a year (annually) or more often if you have foot problems. If you have foot problems, report any cuts, sores, or bruises to your health care provider immediately. Contact a health care provider if:  You have a medical condition that increases your risk of infection and you have any cuts, sores, or bruises on your feet.  You have an injury that is not   healing.  You have redness on your legs or feet.  You feel burning or tingling in your legs or feet.  You have pain or cramps in your legs and feet.  Your legs or feet are numb.  Your feet always feel cold.  You have pain around a toenail. Get help right away if:  You have a wound, scrape, corn, or callus on your foot and: ? You have pain, swelling, or redness that gets worse. ? You have fluid or blood coming from the wound, scrape, corn, or callus. ? Your wound, scrape, corn, or callus feels warm to the touch. ? You have pus or a bad smell coming from the wound, scrape, corn, or callus. ? You have a fever. ? You have a red line going up your leg. Summary  Check your feet every day  for cuts, sores, red spots, swelling, and blisters.  Moisturize feet and legs daily.  Wear shoes that fit properly and have enough cushioning.  If you have foot problems, report any cuts, sores, or bruises to your health care provider immediately.  Schedule a complete foot exam at least once a year (annually) or more often if you have foot problems. This information is not intended to replace advice given to you by your health care provider. Make sure you discuss any questions you have with your health care provider. Document Released: 02/08/2000 Document Revised: 03/25/2017 Document Reviewed: 03/14/2016 Elsevier Patient Education  2020 [employer]   Onychomycosis/Fungal Toenails  WHAT IS IT? An infection that lies within the keratin of your nail plate that is caused by a fungus.  WHY ME? Fungal infections affect all ages, sexes, races, and creeds.  There may be many factors that predispose you to a fungal infection such as age, coexisting medical conditions such as diabetes, or an autoimmune disease; stress, medications, fatigue, genetics, etc.  Bottom line: fungus thrives in a warm, moist environment and your shoes offer such a location.  IS IT CONTAGIOUS? Theoretically, yes.  You do not want to share shoes, nail clippers or files with someone who has fungal toenails.  Walking around barefoot in the same room or sleeping in the same bed is unlikely to transfer the organism.  It is important to realize, however, that fungus can spread easily from one nail to the next on the same foot.  HOW DO WE TREAT THIS?  There are several ways to treat this condition.  Treatment may depend on many factors such as age, medications, pregnancy, liver and kidney conditions, etc.  It is best to ask your doctor which options are available to you.  1. No treatment.   Unlike many other medical concerns, you can live with this condition.  However for many people this can be a painful condition and may lead to  ingrown toenails or a bacterial infection.  It is recommended that you keep the nails cut short to help reduce the amount of fungal nail. 2. Topical treatment.  These range from herbal remedies to prescription strength nail lacquers.  About 40-50% effective, topicals require twice daily application for approximately 9 to 12 months or until an entirely new nail has grown out.  The most effective topicals are medical grade medications available through physicians offices. 3. Oral antifungal medications.  With an 80-90% cure rate, the most common oral medication requires 3 to 4 months of therapy and stays in your system for a year as the new nail grows out.  Oral antifungal medications do require   blood work to make sure it is a safe drug for you.  A liver function panel will be performed prior to starting the medication and after the first month of treatment.  It is important to have the blood work performed to avoid any harmful side effects.  In general, this medication safe but blood work is required. 4. Laser Therapy.  This treatment is performed by applying a specialized laser to the affected nail plate.  This therapy is noninvasive, fast, and non-painful.  It is not covered by insurance and is therefore, out of pocket.  The results have been very good with a 80-95% cure rate.  The [facility] is the only practice in the area to offer this therapy. 5. Permanent Nail Avulsion.  Removing the entire nail so that a new nail will not grow back. 

## 2018-11-02 NOTE — Progress Notes (Signed)
Subjective: [name] Bridges is seen today for follow up painful, elongated, thickened toenails 1-5 b/l feet that she cannot cut. Pain interferes with daily activities. Aggravating factor includes wearing enclosed shoe gear and relieved with periodic debridement.  Objective:  Neurovascular status unchanged: CFT <3 seconds x 10 digits.  Palpable pedal pulses b/l.  Digital hair present x 10.   Skin temperature gradient WNL b/l.  Edema noted b/l with b/l intact compression hose.   Sensation intact 5/5 with 10 gram monofilament.   Dermatological Examination: Skin with normal turgor, texture and tone b/l.  Toenails 1-5 b/l discolored, thick, dystrophic with subungual debris and pain with palpation to nailbeds due to thickness of nails.  Musculoskeletal: Muscle strength 5/5 to all LE muscle groups b/l.  No gross bony deformities b/l.  No pain, crepitus or joint limitation noted with ROM.   Assessment: Painful onychomycosis toenails 1-5 b/l   Plan: 1. Toenails 1-5 b/l were debrided in length and girth without iatrogenic bleeding. 2. Patient to continue soft, supportive shoe gear 3. Patient to report any pedal injuries to medical professional immediately. 4. Follow up 3 months.  5. Patient/POA to call should there be a concern in the interim.

## 2018-11-17 DIAGNOSIS — G5622 Lesion of ulnar nerve, left upper limb: Secondary | ICD-10-CM | POA: Insufficient documentation

## 2019-02-01 ENCOUNTER — Other Ambulatory Visit: Payer: Self-pay

## 2019-02-01 ENCOUNTER — Encounter: Payer: Self-pay | Admitting: Podiatry

## 2019-02-01 ENCOUNTER — Ambulatory Visit: Payer: Medicare HMO | Admitting: Podiatry

## 2019-02-01 DIAGNOSIS — M79674 Pain in right toe(s): Secondary | ICD-10-CM | POA: Diagnosis not present

## 2019-02-01 DIAGNOSIS — M79675 Pain in left toe(s): Secondary | ICD-10-CM | POA: Diagnosis not present

## 2019-02-01 DIAGNOSIS — B351 Tinea unguium: Secondary | ICD-10-CM

## 2019-02-01 NOTE — Patient Instructions (Signed)
Diabetes Mellitus and Foot Care Foot care is an important part of your health, especially when you have diabetes. Diabetes may cause you to have problems because of poor blood flow (circulation) to your feet and legs, which can cause your skin to:  Become thinner and drier.  Break more easily.  Heal more slowly.  Peel and crack. You may also have nerve damage (neuropathy) in your legs and feet, causing decreased feeling in them. This means that you may not notice minor injuries to your feet that could lead to more serious problems. Noticing and addressing any potential problems early is the best way to prevent future foot problems. How to care for your feet Foot hygiene  Wash your feet daily with warm water and mild soap. Do not use hot water. Then, pat your feet and the areas between your toes until they are completely dry. Do not soak your feet as this can dry your skin.  Trim your toenails straight across. Do not dig under them or around the cuticle. File the edges of your nails with an emery board or nail file.  Apply a moisturizing lotion or petroleum jelly to the skin on your feet and to dry, brittle toenails. Use lotion that does not contain alcohol and is unscented. Do not apply lotion between your toes. Shoes and socks  Wear clean socks or stockings every day. Make sure they are not too tight. Do not wear knee-high stockings since they may decrease blood flow to your legs.  Wear shoes that fit properly and have enough cushioning. Always look in your shoes before you put them on to be sure there are no objects inside.  To break in new shoes, wear them for just a few hours a day. This prevents injuries on your feet. Wounds, scrapes, corns, and calluses  Check your feet daily for blisters, cuts, bruises, sores, and redness. If you cannot see the bottom of your feet, use a mirror or ask someone for help.  Do not cut corns or calluses or try to remove them with medicine.  If you  find a minor scrape, cut, or break in the skin on your feet, keep it and the skin around it clean and dry. You may clean these areas with mild soap and water. Do not clean the area with peroxide, alcohol, or iodine.  If you have a wound, scrape, corn, or callus on your foot, look at it several times a day to make sure it is healing and not infected. Check for: ? Redness, swelling, or pain. ? Fluid or blood. ? Warmth. ? Pus or a bad smell. General instructions  Do not cross your legs. This may decrease blood flow to your feet.  Do not use heating pads or hot water bottles on your feet. They may burn your skin. If you have lost feeling in your feet or legs, you may not know this is happening until it is too late.  Protect your feet from hot and cold by wearing shoes, such as at the beach or on hot pavement.  Schedule a complete foot exam at least once a year (annually) or more often if you have foot problems. If you have foot problems, report any cuts, sores, or bruises to your health care provider immediately. Contact a health care provider if:  You have a medical condition that increases your risk of infection and you have any cuts, sores, or bruises on your feet.  You have an injury that is not   healing.  You have redness on your legs or feet.  You feel burning or tingling in your legs or feet.  You have pain or cramps in your legs and feet.  Your legs or feet are numb.  Your feet always feel cold.  You have pain around a toenail. Get help right away if:  You have a wound, scrape, corn, or callus on your foot and: ? You have pain, swelling, or redness that gets worse. ? You have fluid or blood coming from the wound, scrape, corn, or callus. ? Your wound, scrape, corn, or callus feels warm to the touch. ? You have pus or a bad smell coming from the wound, scrape, corn, or callus. ? You have a fever. ? You have a red line going up your leg. Summary  Check your feet every day  for cuts, sores, red spots, swelling, and blisters.  Moisturize feet and legs daily.  Wear shoes that fit properly and have enough cushioning.  If you have foot problems, report any cuts, sores, or bruises to your health care provider immediately.  Schedule a complete foot exam at least once a year (annually) or more often if you have foot problems. This information is not intended to replace advice given to you by your health care provider. Make sure you discuss any questions you have with your health care provider. Document Released: 02/08/2000 Document Revised: 03/25/2017 Document Reviewed: 03/14/2016 Elsevier Patient Education  2020 [employer]  

## 2019-02-06 NOTE — Progress Notes (Signed)
Subjective: [name] Bridges is seen today for follow up painful, elongated, thickened toenails bilateral feet that she cannot cut. Pain interferes with daily activities. Aggravating factor includes wearing enclosed shoe gear and relieved with periodic debridement.  She has h/o chronic cellulitis and has developed blister on top of her left foot which is being treated by her PCP.   Medications reviewed in chart.  Allergies  Allergen Reactions  . Codeine Nausea And Vomiting and Other (See Comments)  . Other Nausea And Vomiting and Other (See Comments)  . Statins Other (See Comments)    Muscle spasms  Muscle spasms   . Sulfamethoxazole Other (See Comments), Hives and Nausea And Vomiting  . Sulfonamide Derivatives Nausea And Vomiting    Objective:  Vascular Examination: Capillary refill time to digits <3 seconds b/l.  Dorsalis pedis present b/l.  Posterior tibial pulses present b/l.  Digital hair present b/l.  Skin temperature gradient WNL b/l.   Compression hose noted b/l.   Dressing dorsum left foot clean, dry and intact.   Dermatological Examination: Skin with normal turgor, texture and tone b/l.  Toenails 1-5 b/l discolored, thick, dystrophic with subungual debris and pain with palpation to nailbeds due to thickness of nails. Incurvated nailplate right great toe lateral border with nail border hypertrophy. No erythema, no edema, no drainage noted.  Musculoskeletal: Muscle strength 5/5 to all LE muscle groups b/l.  Neurological Examination: Protective sensation intact 5/5 with 10 gram monofilament bilaterally.  Assessment: Painful onychomycosis toenails 1-5 b/l   Plan: 1. Toenails 1-5 b/l were debrided in length and girth without iatrogenic bleeding. Offending nail border debrided and curretaged right hallux. Border cleansed with alcohol and triple antibiotic applied. No further treatment required by patient/caregiver. 2. Patient to continue soft, supportive shoe  gear daily. 3. Patient to report any pedal injuries to medical professional immediately. 4. Follow up 3 months.  5. Patient/POA to call should there be a concern in the interim.

## 2019-05-03 ENCOUNTER — Ambulatory Visit: Payer: Medicare HMO | Admitting: Podiatry

## 2019-12-20 DIAGNOSIS — R319 Hematuria, unspecified: Secondary | ICD-10-CM | POA: Insufficient documentation

## 2020-07-17 DIAGNOSIS — R399 Unspecified symptoms and signs involving the genitourinary system: Secondary | ICD-10-CM | POA: Insufficient documentation

## 2020-10-18 DIAGNOSIS — M7989 Other specified soft tissue disorders: Secondary | ICD-10-CM | POA: Insufficient documentation

## 2020-11-27 DIAGNOSIS — I480 Paroxysmal atrial fibrillation: Secondary | ICD-10-CM | POA: Insufficient documentation

## 2020-11-28 DIAGNOSIS — Z79899 Other long term (current) drug therapy: Secondary | ICD-10-CM | POA: Insufficient documentation

## 2021-02-06 DIAGNOSIS — Z7901 Long term (current) use of anticoagulants: Secondary | ICD-10-CM | POA: Insufficient documentation

## 2021-07-01 ENCOUNTER — Ambulatory Visit (INDEPENDENT_AMBULATORY_CARE_PROVIDER_SITE_OTHER): Payer: Medicare HMO | Admitting: Podiatry

## 2021-07-01 DIAGNOSIS — M79675 Pain in left toe(s): Secondary | ICD-10-CM | POA: Diagnosis not present

## 2021-07-01 DIAGNOSIS — M79674 Pain in right toe(s): Secondary | ICD-10-CM

## 2021-07-01 DIAGNOSIS — Z794 Long term (current) use of insulin: Secondary | ICD-10-CM | POA: Diagnosis not present

## 2021-07-01 DIAGNOSIS — B351 Tinea unguium: Secondary | ICD-10-CM

## 2021-07-01 DIAGNOSIS — E119 Type 2 diabetes mellitus without complications: Secondary | ICD-10-CM

## 2021-07-09 ENCOUNTER — Encounter: Payer: Self-pay | Admitting: Podiatry

## 2021-07-09 NOTE — Progress Notes (Signed)
?  Subjective:  ?Patient ID: [name] Bridges, female    DOB: [birth year],  MRN: [id number] ? ?81 y.o. female presents preventative diabetic foot care and painful elongated mycotic toenails 1-5 bilaterally which are tender when wearing enclosed shoe gear. Pain is relieved with periodic professional debridement. ? ?Patient states blood glucose was 112 mg/dl today.  Last A1c was 6.4%. ? ?New problem(s): She is currently under the care of Wound Care for wound secondary to lymphedema of RLE. ? ?PCP is [name] Noon, MD , and last visit was 06/25/2021. ? ?Allergies  ?Allergen Reactions  ? Gabapentin Shortness Of Breath  ?  Tachycardia  ? Clindamycin/Lincomycin Diarrhea and Nausea And Vomiting  ? Codeine Nausea And Vomiting and Other (See Comments)  ? Other Nausea And Vomiting and Other (See Comments)  ? Statins Other (See Comments)  ?  Muscle spasms  ?Muscle spasms   ? Sulfamethoxazole Other (See Comments), Hives and Nausea And Vomiting  ? Sulfonamide Derivatives Nausea And Vomiting  ? ? ?Review of Systems: Negative except as noted in the HPI.  ? ?Objective:  ?[name] Bridges is a pleasant 81 y.o. female, morbidly obese, in NAD. AAO x 3. ? ?Vascular Examination: ?Vascular status intact b/l with palpable pedal pulses. CFT<3 seconds b/l. No pain with calf compression b/l. Skin temperature gradient WNL b/l. Lower extremity compression wrap noted RLE which is clean, dry and intact. Lymphedema present BLE. ? ?Neurological Examination: ?Sensation grossly intact b/l with 10 gram monofilament. ? ?Dermatological Examination: ?No new wounds. No interdigital maceration noted b/l. Toenails 1-5 b/l thick, discolored, elongated with subungual debris and pain on dorsal palpation. No hyperkeratotic lesions noted b/l.  ? ?Musculoskeletal Examination: ?Muscle strength 5/5 to b/l LE. Pes planus deformity noted bilateral LE. ? ?Radiographs: None ?  ?Assessment:  ? ?1. Pain due to onychomycosis of toenails of both feet   ?2. Type 2 diabetes  mellitus without complication, with long-term current use of insulin ([name])   ? ?Plan:  ?-Patient was evaluated and treated. All patient's and/or POA's questions/concerns answered on today's visit. ?-Mycotic toenails 1-5 bilaterally were debrided in length and girth with sterile nail nippers and dremel without incident. ?-Patient/POA to call should there be question/concern in the interim. ? ?Return in about 3 months (around 10/01/2021). ? ?Marzetta Board, DPM ?

## 2021-10-02 ENCOUNTER — Ambulatory Visit: Payer: Medicare HMO | Admitting: Podiatry

## 2021-10-18 NOTE — Progress Notes (Signed)
BETTY, DAIDONE ([id number]) Visit Report for 10/21/2021 Allergy List Details Patient Name: Date of Service: [name], [name] 10/21/2021 1:15 PM Medical Record Number: [id number] Patient Account Number: [id number] Date of Birth/Sex: Treating RN: Jan 29, 1941 (81 y.o. [name] Adelee Hannula: Other Clinician: Referring Shivansh Hardaway: Treating Juris Gosnell/Extender: Yaakov Guthrie in Treatment: 0 Allergies Active Allergies clindamycin Reaction: N/V, Diarrhea codeine Reaction: N/V gabapentin Reaction: shortness of breath Statins-HMG-CoA Reductase Inhibitors sulfamethoxazole Reaction: hives Allergy Notes Electronic Signature(s) Signed: 10/18/2021 7:46:41 AM By: Lorrin Jackson Entered By: Lorrin Jackson on 10/18/2021 07:46:41

## 2021-10-21 ENCOUNTER — Encounter (HOSPITAL_BASED_OUTPATIENT_CLINIC_OR_DEPARTMENT_OTHER): Payer: Medicare HMO | Attending: Internal Medicine | Admitting: Internal Medicine

## 2021-10-21 DIAGNOSIS — L97812 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d: Secondary | ICD-10-CM | POA: Insufficient documentation

## 2021-10-21 DIAGNOSIS — Z7901 Long term (current) use of anticoagulants: Secondary | ICD-10-CM | POA: Insufficient documentation

## 2021-10-21 DIAGNOSIS — I4891 Unspecified atrial fibrillation: Secondary | ICD-10-CM | POA: Insufficient documentation

## 2021-10-21 DIAGNOSIS — Z6841 Body Mass Index (BMI) 40.0 and over, adult: Secondary | ICD-10-CM | POA: Diagnosis not present

## 2021-10-21 DIAGNOSIS — Z79899 Other long term (current) drug therapy: Secondary | ICD-10-CM | POA: Diagnosis not present

## 2021-10-21 DIAGNOSIS — E11622 Type 2 diabetes mellitus with other skin ulcer: Secondary | ICD-10-CM | POA: Insufficient documentation

## 2021-10-21 DIAGNOSIS — I87311 Chronic venous hypertension (idiopathic) with ulcer of right lower extremity: Secondary | ICD-10-CM | POA: Insufficient documentation

## 2021-10-21 DIAGNOSIS — Z794 Long term (current) use of insulin: Secondary | ICD-10-CM | POA: Diagnosis not present

## 2021-10-21 NOTE — Progress Notes (Signed)
ANISSIA, WESSELLS ([id number]) Visit Report for 10/21/2021 Chief Complaint Document Details Patient Name: Date of Service: Jannifer Rodney 10/21/2021 1:15 PM Medical Record Number: [id number] Patient Account Number: [id number] Date of Birth/Sex: Treating RN: 24-Jun-1940 (81 y.o. [name] Primary Care Provider: [name] Other Clinician: Referring Provider: Treating Provider/Extender: Clydene Pugh in Treatment: 0 Information Obtained from: Patient Chief Complaint 10/21/2021; right lower extremity wound Electronic Signature(s) Signed: 10/21/2021 4:36:36 PM By: [name] DO Entered By: [name] on 10/21/2021 15:19:25 -------------------------------------------------------------------------------- HPI Details Patient Name: Date of Service: [name], [name] 10/21/2021 1:15 PM Medical Record Number: [id number] Patient Account Number: [id number] Date of Birth/Sex: Treating RN: Mar 13, 1940 (81 y.o. [name] Primary Care Provider: [name] Other Clinician: Referring Provider: Treating Provider/Extender: Clydene Pugh in Treatment: 0 History of Present Illness HPI Description: Admission 10/21/2021 Ms. Charlie Char is an 81 year old female with a past medical history of morbid obesity, insulin-dependent type 2 diabetes, atrial fibrillation on Eliquis and lymphedema/venous insufficiency that presents to the clinic for a right lower extremity wound that has been present for the past 2 months. She has a history of wounds that wax and wane in healing over the past 2 years. She has been following with [facility] for this issue. They have been using Kerlix and Ace bandage along with silver alginate. She is also on torsemide 40 mg daily. She has been taking this dose for the past 5 years. She states she has a lot of drainage from the wound beds and often requires several dressing  changes throughout the day. She has home health that comes out twice weekly. She has lymphedema pumps that she uses twice daily. Currently she denies signs of infection. Electronic Signature(s) Signed: 10/21/2021 4:36:36 PM By: [name] DO Entered By: [name] on 10/21/2021 15:24:23 -------------------------------------------------------------------------------- Physical Exam Details Patient Name: Date of Service: [name], [name] 10/21/2021 1:15 PM Medical Record Number: [id number] Patient Account Number: [id number] Date of Birth/Sex: Treating RN: 03-09-1940 (81 y.o. [name] Primary Care Provider: Other Clinician: [name] Referring Provider: Treating Provider/Extender: Clydene Pugh in Treatment: 0 Constitutional respirations regular, non-labored and within target range for patient.. Cardiovascular 2+ dorsalis pedis/posterior tibialis pulses. Psychiatric pleasant and cooperative. Notes Right lower extremity: T the posterior and medial aspect there are open wounds. T the periwound there is significant excoriation. Serous drainage. No signs of o o infection including increased warmth, erythema or purulent drainage. 2+ pitting edema to the knee. Electronic Signature(s) Signed: 10/21/2021 4:36:36 PM By: [name] DO Entered By: [name] on 10/21/2021 15:29:48 -------------------------------------------------------------------------------- Physician Orders Details Patient Name: Date of Service: [name], [name] 10/21/2021 1:15 PM Medical Record Number: [id number] Patient Account Number: [id number] Date of Birth/Sex: Treating RN: 1941-01-14 (81 y.o. [name] Primary Care Provider: [name] Other Clinician: Referring Provider: Treating Provider/Extender: Clydene Pugh in Treatment: 0 Verbal / Phone Orders: No Diagnosis Coding ICD-10 Coding Code  Description [phone] 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d I87.311 Chronic venous hypertension (idiopathic) with ulcer of right lower extremity E11.622 Type 2 diabetes mellitus with other skin ulcer I48.91 Unspecified atrial fibrillation Z79.01 Long term (current) use of anticoagulants Follow-up Appointments ppointment in 1 week. - 10/29/21 @ 2:45p with Dr. Heber Grimes and [name], [place] (Room 7) Return A Nurse Visit: - 10/23/21 @ 2:15pm for  wrap change Other: - Please call PCP regarding increasing Torsemide Anesthetic (In clinic) Topical Lidocaine 5% applied to wound bed (In clinic) Topical Lidocaine 4% applied to wound bed Bathing/ Shower/ Hygiene May shower with protection but do not get wound dressing(s) wet. - Can shower with cast protector bag from [facility] or [facility]. Edema Control - Lymphedema / SCD / Other Lymphedema Pumps. Use Lymphedema pumps on leg(s) 2-3 times a day for 45-60 minutes. If wearing any wraps or hose, do not remove them. Continue exercising as instructed. Elevate legs to the level of the heart or above for 30 minutes daily and/or when sitting, a frequency of: - throughout the day Avoid standing for long periods of time. Additional Orders / Instructions Follow Nutritious Diet - Monitor/Control Blood Sugar Home Health Dressing changes to be completed by [place] on Monday / Wednesday / Friday except when patient has scheduled visit at [facility]. - For next few weeks can [place] come 3x week until we get edema under control. Other Home Health Orders/Instructions: [facility]: Fax [phone] Wound Treatment Wound #1 - Lower Leg Wound Laterality: Right, Circumferential Cleanser: Soap and Water (Home Health) 1 x Per Day/30 Days Discharge Instructions: May shower and wash wound with dial antibacterial soap and water prior to dressing change. Cleanser: Wound Cleanser (Home Health) 1 x Per Day/30 Days Discharge Instructions:  Cleanse the wound with wound cleanser prior to applying a clean dressing using gauze sponges, not tissue or cotton balls. Peri-Wound Care: Triamcinolone 15 (g) (Home Health) 1 x Per Day/30 Days Discharge Instructions: Use triamcinolone 15 (g) as directed Peri-Wound Care: Sween Lotion (Moisturizing lotion) (Home Health) 1 x Per Day/30 Days Discharge Instructions: Apply moisturizing lotion as directed Prim Dressing: PolyMem Silver Non-Adhesive Dressing, 4.25x4.25 in (Home Health) 1 x Per Day/30 Days ary Discharge Instructions: Apply to wound bed as instructed Secondary Dressing: ABD Pad, 8x10 (Home Health) 1 x Per Day/30 Days Discharge Instructions: Apply over primary dressing as directed. Secondary Dressing: Zetuvit Plus 4x8 in [facility]) 1 x Per Day/30 Days Discharge Instructions: Apply over primary dressing as directed. Compression Wrap: Kerlix Roll 4.5x3.1 (in/yd) (Home Health) 1 x Per Day/30 Days Discharge Instructions: Apply Kerlix and Coban compression as directed. Compression Wrap: Coban Self-Adherent Wrap 4x5 (in/yd) (Home Health) 1 x Per Day/30 Days Discharge Instructions: Apply over Kerlix as directed. Electronic Signature(s) Signed: 10/21/2021 4:36:36 PM By: [name] DO Entered By: [name] on 10/21/2021 15:25:41 -------------------------------------------------------------------------------- Problem List Details Patient Name: Date of Service: [name], [name] 10/21/2021 1:15 PM Medical Record Number: [id number] Patient Account Number: [id number] Date of Birth/Sex: Treating RN: Mar 27, 1940 (81 y.o. [name] Primary Care Provider: [name] Other Clinician: Referring Provider: Treating Provider/Extender: Clydene Pugh in Treatment: 0 Active Problems ICD-10 Encounter Code Description Active Date MDM Diagnosis [phone] Non-pressure chronic ulcer of other part of right lower leg with fat layer 10/21/2021 No  Yes exposed I87.311 Chronic venous hypertension (idiopathic) with ulcer of right lower extremity 10/21/2021 No Yes E11.622 Type 2 diabetes mellitus with other skin ulcer 10/21/2021 No Yes I48.91 Unspecified atrial fibrillation 10/21/2021 No Yes Z79.01 Long term (current) use of anticoagulants 10/21/2021 No Yes Inactive Problems Resolved Problems Electronic Signature(s) Signed: 10/21/2021 4:36:36 PM By: [name] DO Entered By: [name] on 10/21/2021 15:19:05 -------------------------------------------------------------------------------- Progress Note Details Patient Name: Date of Service: [name], [name] 10/21/2021 1:15 PM Medical Record Number: [id number] Patient Account Number: [id number] Date of Birth/Sex: Treating  RN: 1940/04/15 (81 y.o. [name] Primary Care Provider: [name] Other Clinician: Referring Provider: Treating Provider/Extender: Clydene Pugh in Treatment: 0 Subjective Chief Complaint Information obtained from Patient 10/21/2021; right lower extremity wound History of Present Illness (HPI) Admission 10/21/2021 Ms. Lia Vigilante is an 81 year old female with a past medical history of morbid obesity, insulin-dependent type 2 diabetes, atrial fibrillation on Eliquis and lymphedema/venous insufficiency that presents to the clinic for a right lower extremity wound that has been present for the past 2 months. She has a history of wounds that wax and wane in healing over the past 2 years. She has been following with [facility] for this issue. They have been using Kerlix and Ace bandage along with silver alginate. She is also on torsemide 40 mg daily. She has been taking this dose for the past 5 years. She states she has a lot of drainage from the wound beds and often requires several dressing changes throughout the day. She has home health that comes out twice weekly. She has lymphedema pumps that she  uses twice daily. Currently she denies signs of infection. Patient History Information obtained from Patient. Allergies clindamycin (Reaction: N/V, Diarrhea), codeine (Reaction: N/V), gabapentin (Reaction: shortness of breath), Statins-HMG-CoA Reductase Inhibitors, sulfamethoxazole (Reaction: hives) Family History Diabetes - Siblings,Mother, Heart Disease - Mother, Hypertension - Mother, Kidney Disease - Mother, Stroke - Father. Social History Never smoker, Marital Status - Married, Alcohol Use - Never, Drug Use - No History, Caffeine Use - Rarely. Medical History Eyes Patient has history of Cataracts Hematologic/Lymphatic Patient has history of Lymphedema Cardiovascular Patient has history of Arrhythmia - A fib, Hypertension, Peripheral Venous Disease Endocrine Patient has history of Type II Diabetes Musculoskeletal Patient has history of Osteoarthritis Neurologic Patient has history of Neuropathy Patient is treated with Insulin. Blood sugar is tested. Medical A Surgical History Notes nd Genitourinary CKD Review of Systems (ROS) Eyes Complains or has symptoms of Glasses / Contacts. Ear/Nose/Mouth/Throat Denies complaints or symptoms of Chronic sinus problems or rhinitis. Respiratory Denies complaints or symptoms of Chronic or frequent coughs, Shortness of Breath. Gastrointestinal Denies complaints or symptoms of Frequent diarrhea, Nausea, Vomiting. Integumentary (Skin) Complains or has symptoms of Wounds. Psychiatric Denies complaints or symptoms of Claustrophobia, Suicidal. Objective Constitutional respirations regular, non-labored and within target range for patient.. Vitals Time Taken: 1:25 PM, Height: 62 in, Source: Stated, Weight: 298 lbs, Source: Stated, BMI: 54.5, Temperature: 98.4 F, Pulse: 65 bpm, Respiratory Rate: 20 breaths/min, Blood Pressure: 152/83 mmHg, Capillary Blood Glucose: 147 mg/dl. Cardiovascular 2+ dorsalis pedis/posterior tibialis  pulses. Psychiatric pleasant and cooperative. General Notes: Right lower extremity: T the posterior and medial aspect there are open wounds. T the periwound there is significant excoriation. Serous o o drainage. No signs of infection including increased warmth, erythema or purulent drainage. 2+ pitting edema to the knee. Integumentary (Hair, Skin) Wound #1 status is Open. Original cause of wound was Gradually Appeared. The date acquired was: 07/29/2021. The wound is located on the Right,Circumferential Lower Leg. The wound measures 16cm length x 13cm width x 0.2cm depth; 163.363cm^2 area and 32.673cm^3 volume. There is Fat Layer (Subcutaneous Tissue) exposed. There is no tunneling or undermining noted. There is a large amount of serous drainage noted. The wound margin is distinct with the outline attached to the wound base. There is large (67-100%) red, pink granulation within the wound bed. There is a small (1-33%) amount of necrotic tissue within the wound bed including Adherent Slough. Assessment Active  Problems ICD-10 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d Chronic venous hypertension (idiopathic) with ulcer of right lower extremity Type 2 diabetes mellitus with other skin ulcer Unspecified atrial fibrillation Long term (current) use of anticoagulants Patient presents with a 42-monthhistory of nonhealing ulcers to the right lower extremity secondary to lymphedema/venous insufficiency. She is currently taking 40 mg of torsemide but she still has significant pitting edema on exam. She would benefit from a higher dose of her diuretic and I recommended she talk with her primary care physician about increasing this. She does need compression therapy as well. She declined ABIs. I can palpate pedal pulses. We will start with Kerlix/Coban and use silver alginate under the wrap. Since she is having so much drainage she would benefit from daily wrap changes.  We can see her twice a week and hopefully home health can come out 3 times a week. We will try and arrange this. For now we will have her follow-up for nurse visit this week and I will see her back again next week. Continue lymphedema pumps. Plan Follow-up Appointments: Return Appointment in 1 week. - 10/29/21 @ 2:45p with Dr. HHeber Carolinaand JLeveda Anna RN (Room 7) Nurse Visit: - 10/23/21 @ 2:15pm for wrap change Other: - Please call PCP regarding increasing Torsemide Anesthetic: (In clinic) Topical Lidocaine 5% applied to wound bed (In clinic) Topical Lidocaine 4% applied to wound bed Bathing/ Shower/ Hygiene: May shower with protection but do not get wound dressing(s) wet. - Can shower with cast protector [facility] [place] Edema Control - Lymphedema / SCD / Other: Lymphedema Pumps. Use Lymphedema pumps on leg(s) 2-3 times a day for 45-60 minutes. If wearing any wraps or hose, do not remove them. Continue exercising as instructed. Elevate legs to the level of the heart or above for 30 minutes daily and/or when sitting, a frequency of: - throughout the day Avoid standing for long periods of time. Additional Orders / Instructions: Follow Nutritious Diet - Monitor/Control Blood Sugar Home Health: D[place] / Wednesday / Friday [facility] - For next few weeks can [place] 3x week until we get edema under control. Other Home Health Orders/Instructions: -[facility]: Fax [phone] #1: - Lower Leg Wound Laterality: Right, Circumferential Cleanser: Soap and Water (Home Health) 1 x Per Day/30 Days Discharge Instructions: May shower and wash wound with dial antibacterial soap and water prior to dressing change. Cleanser: Wound Cleanser (Home Health) 1 x Per Day/30 Days Discharge Instructions: Cleanse the wound with wound cleanser prior to applying a clean dressing using gauze sponges, not  tissue or cotton balls. Peri-Wound Care: Triamcinolone 15 (g) (Home Health) 1 x Per Day/30 Days Discharge Instructions: Use triamcinolone 15 (g) as directed Peri-Wound Care: Sween Lotion (Moisturizing lotion) (Home Health) 1 x Per Day/30 Days Discharge Instructions: Apply moisturizing lotion as directed Prim Dressing: PolyMem Silver Non-Adhesive Dressing, 4.25x4.25 in (Home Health) 1 x Per Day/30 Days ary Discharge Instructions: Apply to wound bed as instructed Secondary Dressing: ABD Pad, 8x10 (Home Health) 1 x Per Day/30 Days Discharge Instructions: Apply over primary dressing as directed. Secondary Dressing: Z[facility] 1 x Per Day/30 Days Discharge Instructions: Apply over primary dressing as directed. Com pression Wrap: Kerlix Roll 4.5x3.1 (in/yd) (Home Health) 1 x Per Day/30 Days Discharge Instructions: Apply Kerlix and Coban compression as directed.  Com pression Wrap: Coban Self-Adherent Wrap 4x5 (in/yd) (Home Health) 1 x Per Day/30 Days Discharge Instructions: Apply over Kerlix as directed. 1. Silver alginate under Kerlix/Coban 2. Nurse visit later this week 3. Follow-up with me in 1 week 4. Sent home health orders Electronic Signature(s) Signed: 10/21/2021 4:36:36 PM By: [name] DO Entered By: [name] on 10/21/2021 15:30:04 -------------------------------------------------------------------------------- HxROS Details Patient Name: Date of Service: [name], [name] 10/21/2021 1:15 PM Medical Record Number: [id number] Patient Account Number: [id number] Date of Birth/Sex: Treating RN: 11-26-1940 (81 y.o. [name] Primary Care Provider: [name] Other Clinician: Referring Provider: Treating Provider/Extender: Clydene Pugh in Treatment: 0 Information Obtained From Patient Eyes Complaints and Symptoms: Positive for: Glasses / Contacts Medical History: Positive for:  Cataracts Ear/Nose/Mouth/Throat Complaints and Symptoms: Negative for: Chronic sinus problems or rhinitis Respiratory Complaints and Symptoms: Negative for: Chronic or frequent coughs; Shortness of Breath Gastrointestinal Complaints and Symptoms: Negative for: Frequent diarrhea; Nausea; Vomiting Integumentary (Skin) Complaints and Symptoms: Positive for: Wounds Psychiatric Complaints and Symptoms: Negative for: Claustrophobia; Suicidal Hematologic/Lymphatic Medical History: Positive for: Lymphedema Cardiovascular Medical History: Positive for: Arrhythmia - A fib; Hypertension; Peripheral Venous Disease Endocrine Medical History: Positive for: Type II Diabetes Time with diabetes: 30 years Treated with: Insulin Blood sugar tested every day: Yes Tested : 3x day Genitourinary Medical History: Past Medical History Notes: CKD Immunological Musculoskeletal Medical History: Positive for: Osteoarthritis Neurologic Medical History: Positive for: Neuropathy Oncologic HBO Extended History Items Eyes: Cataracts Immunizations Pneumococcal Vaccine: Received Pneumococcal Vaccination: Yes Received Pneumococcal Vaccination On or After 60th Birthday: Yes Implantable Devices None Family and Social History Diabetes: Yes - Siblings,Mother; Heart Disease: Yes - Mother; Hypertension: Yes - Mother; Kidney Disease: Yes - Mother; Stroke: Yes - Father; Never smoker; Marital Status - Married; Alcohol Use: Never; Drug Use: No History; Caffeine Use: Rarely; Financial Concerns: No; Food, Clothing or Shelter Needs: No; Support System Lacking: No; Transportation Concerns: No Electronic Signature(s) Signed: 10/21/2021 4:36:36 PM By: [name] DO Signed: 10/21/2021 5:12:03 PM By: Lorrin Jackson Previous Signature: 10/21/2021 10:13:31 AM Version By: [name] DO Entered By: Lorrin Jackson on 10/21/2021  13:32:53 -------------------------------------------------------------------------------- SuperBill Details Patient Name: Date of Service: [name], [name] 10/21/2021 Medical Record Number: [id number] Patient Account Number: [id number] Date of Birth/Sex: Treating RN: [birth year] (81 y.o. [name] Primary Care Provider: [name] Other Clinician: Referring Provider: Treating Provider/Extender: Clydene Pugh in Treatment: 0 Diagnosis Coding ICD-10 Codes Code Description [phone] 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d I87.311 Chronic venous hypertension (idiopathic) with ulcer of right lower extremity E11.622 Type 2 diabetes mellitus with other skin ulcer I48.91 Unspecified atrial fibrillation Z79.01 Long term (current) use of anticoagulants Facility Procedures CPT4 Code: [id number] Description: 99214 - WOUND CARE VISIT-LEV 4 EST PT Modifier: 25 Quantity: 1 Physician Procedures : CPT4 Code Description Modifier [id number] - WC PHYS LEVEL 4 - NEW PT ICD-10 Diagnosis Description I87.311 Chronic venous hypertension (idiopathic) with ulcer of right lower extremity L97.812 Non-pressure chronic ulcer of other part of right lower  leg with fat layer exposed E11.622 Type 2 diabetes mellitus with other skin ulcer I48.91 Unspecified atrial fibrillation Quantity: 1 Electronic Signature(s) Signed: 10/21/2021 4:36:36 PM By: [name] DO Entered By: [name] on 10/21/2021 15:30:10

## 2021-10-21 NOTE — Progress Notes (Signed)
JAZARIA, JARECKI ([id number]) Visit Report for 10/21/2021 Abuse Risk Screen Details Patient Name: Date of Service: [name], [name] 10/21/2021 1:15 PM Medical Record Number: [id number] Patient Account Number: [id number] Date of Birth/Sex: Treating RN: 03-Sep-1940 (81 y.o. [name] Primary Care Ashtynn Berke: [name] Other Clinician: Referring Santanna Olenik: Treating Shanae Luo/Extender: Clydene Pugh in Treatment: 0 Abuse Risk Screen Items Answer ABUSE RISK SCREEN: Has anyone close to you tried to hurt or harm you recentlyo No Do you feel uncomfortable with anyone in your familyo No Has anyone forced you do things that you didnt want to doo No Electronic Signature(s) Signed: 10/21/2021 5:12:03 PM By: Lorrin Jackson Entered By: Lorrin Jackson on 10/21/2021 13:30:22 -------------------------------------------------------------------------------- Activities of Daily Living Details Patient Name: Date of ServiceOrlean Patten [name] 10/21/2021 1:15 PM Medical Record Number: [id number] Patient Account Number: [id number] Date of Birth/Sex: Treating RN: August 09, 1940 (81 y.o. [name] Primary Care Khadeja Abt: [name] Other Clinician: Referring Shaniya Tashiro: Treating Lacey Wallman/Extender: Clydene Pugh in Treatment: 0 Activities of Daily Living Items Answer Activities of Daily Living (Please select one for each item) Drive Automobile Need Assistance T Medications ake Completely Able Use T elephone Completely Able Care for Appearance Need Assistance Use T oilet Completely Able Bath / Shower Completely Able Dress Self Completely Able Feed Self Completely Able Walk Need Assistance Get In / Out Bed Need Assistance Housework Need Assistance Prepare Meals Need Assistance Handle Money Completely Able Shop for Self Need Assistance Electronic Signature(s) Signed: 10/21/2021 5:12:03 PM By: Lorrin Jackson Entered By:  Lorrin Jackson on 10/21/2021 13:34:38 -------------------------------------------------------------------------------- Education Screening Details Patient Name: Date of Service: [name], [name] 10/21/2021 1:15 PM Medical Record Number: [id number] Patient Account Number: [id number] Date of Birth/Sex: Treating RN: 1941-01-17 (81 y.o. [name] Primary Care Dennice Tindol: [name] Other Clinician: Referring Alonnie Bieker: Treating Yousuf Ager/Extender: Clydene Pugh in Treatment: 0 Primary Learner Assessed: Patient Learning Preferences/Education Level/Primary Language Learning Preference: Explanation, Demonstration, Printed Material Highest Education Level: High School Preferred Language: English Cognitive Barrier Language Barrier: No Translator Needed: No Memory Deficit: No Emotional Barrier: No Cultural/Religious Beliefs Affecting Medical Care: No Physical Barrier Impaired Vision: Yes Glasses Impaired Hearing: No Decreased Hand dexterity: No Knowledge/Comprehension Knowledge Level: High Comprehension Level: High Ability to understand written instructions: High Ability to understand verbal instructions: High Motivation Anxiety Level: Calm Cooperation: Cooperative Education Importance: Acknowledges Need Interest in Health Problems: Asks Questions Perception: Coherent Willingness to Engage in Self-Management High Activities: Readiness to Engage in Self-Management High Activities: Electronic Signature(s) Signed: 10/21/2021 5:12:03 PM By: Lorrin Jackson Entered By: Lorrin Jackson on 10/21/2021 13:35:08 -------------------------------------------------------------------------------- Fall Risk Assessment Details Patient Name: Date of Service: [name], [name] 10/21/2021 1:15 PM Medical Record Number: [id number] Patient Account Number: [id number] Date of Birth/Sex: Treating RN: 1940-04-15 (81 y.o. [name] Primary Care  Sebert Stollings: [name] Other Clinician: Referring Dreonna Hussein: Treating Merdis Snodgrass/Extender: Clydene Pugh in Treatment: 0 Fall Risk Assessment Items Have you had 2 or more falls in the last 12 monthso 0 Yes Have you had any fall that resulted in injury in the last 12 monthso 0 No FALLS RISK SCREEN History of falling - immediate or within 3 months 25 Yes Secondary diagnosis (Do you have 2 or more medical diagnoseso) 0 No Ambulatory aid None/bed rest/wheelchair/nurse 0 No Crutches/cane/walker 15 Yes Furniture 0 No Intravenous therapy Access/Saline/Heparin Lock 0 No Gait/Transferring Normal/ bed rest/ wheelchair 0 No Weak (  short steps with or without shuffle, stooped but able to lift head while walking, may seek 10 Yes support from furniture) Impaired (short steps with shuffle, may have difficulty arising from chair, head down, impaired 0 No balance) Mental Status Oriented to own ability 0 Yes Electronic Signature(s) Signed: 10/21/2021 5:12:03 PM By: Lorrin Jackson Entered By: Lorrin Jackson on 10/21/2021 13:35:49 -------------------------------------------------------------------------------- Foot Assessment Details Patient Name: Date of Service: [name], [name] 10/21/2021 1:15 PM Medical Record Number: [id number] Patient Account Number: [id number] Date of Birth/Sex: Treating RN: [birth year] (81 y.o. [name] Primary Care Garey Alleva: [name] Other Clinician: Referring Thomos Domine: Treating Cassidee Deats/Extender: Clydene Pugh in Treatment: 0 Foot Assessment Items Site Locations + = Sensation present, - = Sensation absent, C = Callus, U = Ulcer R = Redness, W = Warmth, M = Maceration, PU = Pre-ulcerative lesion F = Fissure, S = Swelling, D = Dryness Assessment Right: Left: Other Deformity: No No Prior Foot Ulcer: No No Prior Amputation: No No Charcot Joint: No No Ambulatory Status: Ambulatory With  Help Assistance Device: Walker Gait: Steady Electronic Signature(s) Signed: 10/21/2021 5:12:03 PM By: Lorrin Jackson Entered By: Lorrin Jackson on 10/21/2021 13:41:34 -------------------------------------------------------------------------------- Nutrition Risk Screening Details Patient Name: Date of ServiceJamie Kato [name] 10/21/2021 1:15 PM Medical Record Number: [id number] Patient Account Number: [id number] Date of Birth/Sex: Treating RN: [birth year] (81 y.o. [name] Primary Care Yasmina Chico: [name] Other Clinician: Referring Dex Blakely: Treating Marcellina Jonsson/Extender: Clydene Pugh in Treatment: 0 Height (in): 62 Weight (lbs): 298 Body Mass Index (BMI): 54.5 Nutrition Risk Screening Items Score Screening NUTRITION RISK SCREEN: I have an illness or condition that made me change the kind and/or amount of food I eat 0 No I eat fewer than two meals per day 0 No I eat few fruits and vegetables, or milk products 0 No I have three or more drinks of beer, liquor or wine almost every day 0 No I have tooth or mouth problems that make it hard for me to eat 0 No I don't always have enough money to buy the food I need 0 No I eat alone most of the time 0 No I take three or more different prescribed or over-the-counter drugs a day 1 Yes Without wanting to, I have lost or gained 10 pounds in the last six months 0 No I am not always physically able to shop, cook and/or feed myself 0 No Nutrition Protocols Good Risk Protocol 0 No interventions needed Moderate Risk Protocol High Risk Proctocol Risk Level: Good Risk Score: 1 Electronic Signature(s) Signed: 10/21/2021 5:12:03 PM By: Lorrin Jackson Entered By: Lorrin Jackson on 10/21/2021 13:35:58

## 2021-10-23 ENCOUNTER — Encounter (HOSPITAL_BASED_OUTPATIENT_CLINIC_OR_DEPARTMENT_OTHER): Payer: Medicare HMO | Admitting: Physician Assistant

## 2021-10-23 DIAGNOSIS — I87311 Chronic venous hypertension (idiopathic) with ulcer of right lower extremity: Secondary | ICD-10-CM | POA: Diagnosis not present

## 2021-10-23 NOTE — Progress Notes (Signed)
TAKIA, RUNYON ([id number]) Visit Report for 10/23/2021 SuperBill Details Patient Name: Date of Service: [name] Bridges 10/23/2021 Medical Record Number: [id number] Patient Account Number: [id number] Date of Birth/Sex: Treating RN: 1940/06/09 (81 y.o. F) Primary Care Provider: [name] Other Clinician: Referring Provider: Treating Provider/Extender: Cena Benton in Treatment: 0 Diagnosis Coding ICD-10 Codes Code Description [phone] 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d I87.311 Chronic venous hypertension (idiopathic) with ulcer of right lower extremity E11.622 Type 2 diabetes mellitus with other skin ulcer I48.91 Unspecified atrial fibrillation Z79.01 Long term (current) use of anticoagulants Facility Procedures CPT4 Code Description Modifier Quantity [id number] - WOUND CARE VISIT-LEV 3 EST PT 1 Electronic Signature(s) Signed: 10/23/2021 4:51:17 PM By: Erenest Blank Signed: 10/23/2021 6:03:16 PM By: Worthy Keeler PA-C Entered By: Erenest Blank on 10/23/2021 16:50:47

## 2021-10-23 NOTE — Progress Notes (Signed)
[name] Bridges ([id number]) Visit Report for 10/23/2021 Arrival Information Details Patient Name: Date of Service: [name] Bridges 10/23/2021 2:15 PM Medical Record Number: [id number] Patient Account Number: [id number] Date of Birth/Sex: Treating RN: [birth year] (81 y.o. F) Primary Care Kei Mcelhiney: [name] Other Clinician: Referring Pollyann Roa: Treating Lenice Koper/Extender: Cena Benton in Treatment: 0 Visit Information History Since Last Visit Added or deleted any medications: No Patient Arrived: [name] Bridges Any new allergies or adverse reactions: No Arrival Time: 14:28 Had a fall or experienced change in No Accompanied By: self activities of daily living that may affect Transfer Assistance: None risk of falls: Patient Identification Verified: Yes Signs or symptoms of abuse/neglect since last visito No Secondary Verification Process Completed: Yes Hospitalized since last visit: No Patient Requires Transmission-Based Precautions: No Implantable device outside of the clinic excluding No Patient Has Alerts: Yes cellular tissue based products placed in the center Patient Alerts: Patient on Blood Thinner since last visit: Has Compression in Place as Prescribed: No Pain Present Now: No Electronic Signature(s) Signed: 10/23/2021 4:51:17 PM By: Erenest Blank Entered By: Erenest Blank on 10/23/2021 14:28:41 -------------------------------------------------------------------------------- Clinic Level of Care Assessment Details Patient Name: Date of ServiceOrlean Patten [name] 10/23/2021 2:15 PM Medical Record Number: [id number] Patient Account Number: [id number] Date of Birth/Sex: Treating RN: [birth year] (81 y.o. F) Primary Care Marcus Schwandt: [name] Other Clinician: Referring Xzavian Semmel: Treating Tashanda Fuhrer/Extender: Cena Benton in Treatment: 0 Clinic Level of Care Assessment Items TOOL 4 Quantity Score X- 1 0 Use when  only an EandM is performed on FOLLOW-UP visit ASSESSMENTS - Nursing Assessment / Reassessment '[]'$  - 0 Reassessment of Co-morbidities (includes updates in patient status) X- 1 5 Reassessment of Adherence to Treatment Plan ASSESSMENTS - Wound and Skin A ssessment / Reassessment X - Simple Wound Assessment / Reassessment - one wound 1 5 '[]'$  - 0 Complex Wound Assessment / Reassessment - multiple wounds '[]'$  - 0 Dermatologic / Skin Assessment (not related to wound area) ASSESSMENTS - Focused Assessment '[]'$  - 0 Circumferential Edema Measurements - multi extremities '[]'$  - 0 Nutritional Assessment / Counseling / Intervention '[]'$  - 0 Lower Extremity Assessment (monofilament, tuning fork, pulses) '[]'$  - 0 Peripheral Arterial Disease Assessment (using hand held doppler) ASSESSMENTS - Ostomy and/or Continence Assessment and Care '[]'$  - 0 Incontinence Assessment and Management '[]'$  - 0 Ostomy Care Assessment and Management (repouching, etc.) PROCESS - Coordination of Care X - Simple Patient / Family Education for ongoing care 1 15 '[]'$  - 0 Complex (extensive) Patient / Family Education for ongoing care X- 1 10 Staff obtains Programmer, systems, Records, T Results / Process Orders est '[]'$  - 0 Staff telephones HHA, Nursing Homes / Clarify orders / etc '[]'$  - 0 Routine Transfer to another Facility (non-emergent condition) '[]'$  - 0 Routine Hospital Admission (non-emergent condition) '[]'$  - 0 New Admissions / Biomedical engineer / Ordering NPWT Apligraf, etc. , '[]'$  - 0 Emergency Hospital Admission (emergent condition) X- 1 10 Simple Discharge Coordination '[]'$  - 0 Complex (extensive) Discharge Coordination PROCESS - Special Needs '[]'$  - 0 Pediatric / Minor Patient Management '[]'$  - 0 Isolation Patient Management '[]'$  - 0 Hearing / Language / Visual special needs '[]'$  - 0 Assessment of Community assistance (transportation, D/C planning, etc.) '[]'$  - 0 Additional assistance / Altered mentation '[]'$  - 0 Support  Surface(s) Assessment (bed, cushion, seat, etc.) INTERVENTIONS - Wound Cleansing / Measurement X - Simple Wound Cleansing - one wound 1 5 '[]'$  - 0  Complex Wound Cleansing - multiple wounds '[]'$  - 0 Wound Imaging (photographs - any number of wounds) '[]'$  - 0 Wound Tracing (instead of photographs) '[]'$  - 0 Simple Wound Measurement - one wound '[]'$  - 0 Complex Wound Measurement - multiple wounds INTERVENTIONS - Wound Dressings '[]'$  - 0 Small Wound Dressing one or multiple wounds '[]'$  - 0 Medium Wound Dressing one or multiple wounds X- 1 20 Large Wound Dressing one or multiple wounds X- 1 5 Application of Medications - topical '[]'$  - 0 Application of Medications - injection INTERVENTIONS - Miscellaneous '[]'$  - 0 External ear exam '[]'$  - 0 Specimen Collection (cultures, biopsies, blood, body fluids, etc.) '[]'$  - 0 Specimen(s) / Culture(s) sent or taken to Lab for analysis '[]'$  - 0 Patient Transfer (multiple staff / Civil Service fast streamer / Similar devices) '[]'$  - 0 Simple Staple / Suture removal (25 or less) '[]'$  - 0 Complex Staple / Suture removal (26 or more) '[]'$  - 0 Hypo / Hyperglycemic Management (close monitor of Blood Glucose) '[]'$  - 0 Ankle / Brachial Index (ABI) - do not check if billed separately X- 1 5 Vital Signs Has the patient been seen at the hospital within the last three years: Yes Total Score: 80 Level Of Care: New/Established - Level 3 Electronic Signature(s) Signed: 10/23/2021 4:51:17 PM By: Erenest Blank Entered By: Erenest Blank on 10/23/2021 16:49:03 -------------------------------------------------------------------------------- Encounter Discharge Information Details Patient Name: Date of Service: [name] Bridges, [name] 10/23/2021 2:15 PM Medical Record Number: [id number] Patient Account Number: [id number] Date of Birth/Sex: Treating RN: January 27, 1941 (81 y.o. F) Primary Care Rome Echavarria: [name] Other Clinician: Referring Joeleen Wortley: Treating Linh Hedberg/Extender: Cena Benton in Treatment: 0 Encounter Discharge Information Items Discharge Condition: Stable Ambulatory Status: Walker Discharge Destination: Home Transportation: Private Auto Accompanied By: self Schedule Follow-up Appointment: No Clinical Summary of Care: Electronic Signature(s) Signed: 10/23/2021 4:51:17 PM By: Erenest Blank Entered By: Erenest Blank on 10/23/2021 16:50:00 -------------------------------------------------------------------------------- Patient/Caregiver Education Details Patient Name: Date of Service: [name] Bridges, [name] 8/30/2023andnbsp2:15 PM Medical Record Number: [id number] Patient Account Number: [id number] Date of Birth/Gender: Treating RN: [birth year] (81 y.o. F) Primary Care Physician: [name] Other Clinician: Referring Physician: Treating Physician/Extender: Cena Benton in Treatment: 0 Education Assessment Education Provided To: Caregiver Education Topics Provided Electronic Signature(s) Signed: 10/23/2021 4:51:17 PM By: Erenest Blank Entered By: Erenest Blank on 10/23/2021 16:49:47 -------------------------------------------------------------------------------- Wound Assessment Details Patient Name: Date of Service: [name] Bridges, [name] 10/23/2021 2:15 PM Medical Record Number: [id number] Patient Account Number: [id number] Date of Birth/Sex: Treating RN: [birth year] (81 y.o. F) Primary Care Bennye Nix: [name] Other Clinician: Referring Yanett Conkright: Treating Lucie Friedlander/Extender: Cena Benton in Treatment: 0 Wound Status Wound Number: 1 Primary Lymphedema Etiology: Wound Location: Right, Circumferential Lower Leg Wound Open Wounding Event: Gradually Appeared Status: Date Acquired: 07/29/2021 Comorbid Cataracts, Lymphedema, Arrhythmia, Hypertension, Peripheral Weeks Of Treatment: 0 History: Venous Disease, Type II Diabetes, Osteoarthritis, Neuropathy Clustered  Wound: No Wound Measurements Length: (cm) 16 Width: (cm) 13 Depth: (cm) 0.2 Area: (cm) 163.363 Volume: (cm) 32.673 % Reduction in Area: 0% % Reduction in Volume: 0% Epithelialization: None Wound Description Classification: Full Thickness Without Exposed Support Structures Wound Margin: Distinct, outline attached Exudate Amount: Large Exudate Type: Serous Exudate Color: amber Foul Odor After Cleansing: No Slough/Fibrino Yes Wound Bed Granulation Amount: Large (67-100%) Exposed Structure Granulation Quality: Red, Pink Fascia Exposed: No Necrotic Amount: Small (1-33%) Fat Layer (Subcutaneous Tissue) Exposed: Yes Necrotic Quality: Adherent  Slough Tendon Exposed: No Muscle Exposed: No Joint Exposed: No Bone Exposed: No Treatment Notes Wound #1 (Lower Leg) Wound Laterality: Right, Circumferential Cleanser Soap and Water Discharge Instruction: May shower and wash wound with dial antibacterial soap and water prior to dressing change. Wound Cleanser Discharge Instruction: Cleanse the wound with wound cleanser prior to applying a clean dressing using gauze sponges, not tissue or cotton balls. Peri-Wound Care Triamcinolone 15 (g) Discharge Instruction: Use triamcinolone 15 (g) as directed Sween Lotion (Moisturizing lotion) Discharge Instruction: Apply moisturizing lotion as directed Topical Primary Dressing PolyMem Silver Non-Adhesive Dressing, 4.25x4.25 in Discharge Instruction: Apply to wound bed as instructed Secondary Dressing ABD Pad, 8x10 Discharge Instruction: Apply over primary dressing as directed. Zetuvit Plus 4x8 in Discharge Instruction: Apply over primary dressing as directed. Secured With Compression Wrap Kerlix Roll 4.5x3.1 (in/yd) Discharge Instruction: Apply Kerlix and Coban compression as directed. Coban Self-Adherent Wrap 4x5 (in/yd) Discharge Instruction: Apply over Kerlix as directed. Compression Stockings Add-Ons Electronic Signature(s) Signed:  10/23/2021 4:51:17 PM By: Erenest Blank Entered By: Erenest Blank on 10/23/2021 14:29:31 -------------------------------------------------------------------------------- Vitals Details Patient Name: Date of Service: [name] Bridges, [name] 10/23/2021 2:15 PM Medical Record Number: [id number] Patient Account Number: [id number] Date of Birth/Sex: Treating RN: Aug 13, 1940 (81 y.o. F) Primary Care Ji Feldner: [name] Other Clinician: Referring Becker Christopher: Treating Arno Cullers/Extender: Cena Benton in Treatment: 0 Vital Signs Time Taken: 14:28 Temperature (F): 98.1 Height (in): 62 Pulse (bpm): 66 Weight (lbs): 298 Respiratory Rate (breaths/min): 18 Body Mass Index (BMI): 54.5 Blood Pressure (mmHg): 125/66 Reference Range: 80 - 120 mg / dl Electronic Signature(s) Signed: 10/23/2021 4:51:17 PM By: Erenest Blank Entered By: Erenest Blank on 10/23/2021 14:29:10

## 2021-10-29 ENCOUNTER — Encounter (HOSPITAL_BASED_OUTPATIENT_CLINIC_OR_DEPARTMENT_OTHER): Payer: Medicare HMO | Attending: Internal Medicine | Admitting: Internal Medicine

## 2021-10-29 DIAGNOSIS — Z6841 Body Mass Index (BMI) 40.0 and over, adult: Secondary | ICD-10-CM | POA: Diagnosis not present

## 2021-10-29 DIAGNOSIS — B372 Candidiasis of skin and nail: Secondary | ICD-10-CM | POA: Diagnosis not present

## 2021-10-29 DIAGNOSIS — Z79899 Other long term (current) drug therapy: Secondary | ICD-10-CM | POA: Diagnosis not present

## 2021-10-29 DIAGNOSIS — I4891 Unspecified atrial fibrillation: Secondary | ICD-10-CM | POA: Insufficient documentation

## 2021-10-29 DIAGNOSIS — E11622 Type 2 diabetes mellitus with other skin ulcer: Secondary | ICD-10-CM | POA: Diagnosis not present

## 2021-10-29 DIAGNOSIS — Z794 Long term (current) use of insulin: Secondary | ICD-10-CM | POA: Insufficient documentation

## 2021-10-29 DIAGNOSIS — L97812 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d: Secondary | ICD-10-CM | POA: Diagnosis present

## 2021-10-29 DIAGNOSIS — I87311 Chronic venous hypertension (idiopathic) with ulcer of right lower extremity: Secondary | ICD-10-CM | POA: Insufficient documentation

## 2021-10-29 DIAGNOSIS — Z7901 Long term (current) use of anticoagulants: Secondary | ICD-10-CM | POA: Diagnosis not present

## 2021-10-29 NOTE — Progress Notes (Signed)
LEE, KALT ([id number]) Visit Report for 10/29/2021 Chief Complaint Document Details Patient Name: Date of Service: [name], [place] [name] Bridges 10/29/2021 2:45 PM Medical Record Number: [id number] Patient Account Number: [id number] Date of Birth/Sex: Treating RN: 20-Sep-1940 (81 y.o. F) Primary Care Provider: [name] Other Clinician: Referring Provider: Treating Provider/Extender: Durwin Reges in Treatment: 1 Information Obtained from: Patient Chief Complaint 10/21/2021; right lower extremity wound Electronic Signature(s) Signed: 10/29/2021 4:32:10 PM By: [name] DO Entered By: [name] on 10/29/2021 15:32:06 -------------------------------------------------------------------------------- HPI Details Patient Name: Date of Service: [name] [place], [place] [name] [name] 10/29/2021 2:45 PM Medical Record Number: [id number] Patient Account Number: [id number] Date of Birth/Sex: Treating RN: [birth year] (81 y.o. F) Primary Care Provider: [name] Other Clinician: Referring Provider: Treating Provider/Extender: Durwin Reges in Treatment: 1 History of Present Illness HPI Description: Admission 10/21/2021 Ms. [name] is an 81 year old female with a past medical history of morbid obesity, insulin-dependent type 2 diabetes, atrial fibrillation on Eliquis and lymphedema/venous insufficiency that presents to the clinic for a right lower extremity wound that has been present for the past 2 months. She has a history of wounds that wax and wane in healing over the past 2 years. She has been following with [facility] for this issue. They have been using Kerlix and Ace bandage along with silver alginate. She is also on torsemide 40 mg daily. She has been taking this dose for the past 5 years. She states she has a lot of drainage from the wound beds and often requires several dressing changes throughout the day. She has home  health that comes out twice weekly. She has lymphedema pumps that she uses twice daily. Currently she denies signs of infection. 9/5; patient presents for follow-up. We have been using PolyMem silver under Kerlix/Coban. She states she tolerated the compression wrap well but felt like it was very tight. Home health has been coming out however they did not have any supplies for the patient and has been using ABD pads with an Ace bandage. She was able to follow-up last week for nurse visit. She is able to make it 24 hours without drainage coming completely through the wrap/ace bandage. She denies signs of infection. Electronic Signature(s) Signed: 10/29/2021 4:32:10 PM By: [name] DO Entered By: [name] on 10/29/2021 15:34:12 -------------------------------------------------------------------------------- Physical Exam Details Patient Name: Date of Service: [name], [place] [name] [name] 10/29/2021 2:45 PM Medical Record Number: [id number] Patient Account Number: [id number] Date of Birth/Sex: Treating RN: January 20, 1941 (81 y.o. F) Primary Care Provider: [name] Other Clinician: Referring Provider: Treating Provider/Extender: Durwin Reges in Treatment: 1 Constitutional respirations regular, non-labored and within target range for patient.. Cardiovascular 2+ dorsalis pedis/posterior tibialis pulses. Psychiatric pleasant and cooperative. Notes Right lower extremity: T the posterior and medial aspect there are open wounds. T the periwound there is significant excoriation. Minimal drainage noted on o o exam. No signs of infection including increased warmth, erythema or purulent drainage. 2+ pitting edema to the knee. Electronic Signature(s) Signed: 10/29/2021 4:32:10 PM By: [name] DO Entered By: [name] on 10/29/2021 15:34:57 -------------------------------------------------------------------------------- Physician Orders Details Patient  Name: Date of Service: [name] [place], [place] [name] [name] 10/29/2021 2:45 PM Medical Record Number: [id number] Patient Account Number: [id number] Date of Birth/Sex: Treating RN: 14-Mar-1940 (81 y.o. [name] Primary Care Provider: [name] Other Clinician: Referring Provider: Treating Provider/Extender: Durwin Reges in Treatment: 1 Verbal /  Phone Orders: No Diagnosis Coding ICD-10 Coding Code Description [phone] 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d I87.311 Chronic venous hypertension (idiopathic) with ulcer of right lower extremity E11.622 Type 2 diabetes mellitus with other skin ulcer I48.91 Unspecified atrial fibrillation Z79.01 Long term (current) use of anticoagulants Follow-up Appointments ppointment in 1 week. - 11/05/21 @ 10:15am with Dr. Heber [place] and [name], RN (Room 7) Return A Anesthetic (In clinic) Topical Lidocaine 5% applied to wound bed (In clinic) Topical Lidocaine 4% applied to wound bed Bathing/ Shower/ Hygiene May shower with protection but do not get wound dressing(s) wet. - Can shower with cast protector bag from [facility] or [place]. Edema Control - Lymphedema / SCD / Other Lymphedema Pumps. Use Lymphedema pumps on leg(s) 2-3 times a day for 45-60 minutes. If wearing any wraps or hose, do not remove them. Continue exercising as instructed. Elevate legs to the level of the heart or above for 30 minutes daily and/or when sitting, a frequency of: - throughout the day Avoid standing for long periods of time. Additional Orders / Instructions Follow Nutritious Diet - Monitor/Control Blood Sugar Home Health Dressing changes to be completed by [place] on Monday / Wednesday / Friday except when patient has scheduled visit at [facility]. - For next few weeks can [place] come 3x week until we get edema under control. Other Home Health Orders/Instructions: [name]: Fax [phone] Wound  Treatment Wound #1 - Lower Leg Wound Laterality: Right, Circumferential Cleanser: Soap and Water (Home Health) 1 x Per Day/30 Days Discharge Instructions: May shower and wash wound with dial antibacterial soap and water prior to dressing change. Cleanser: Wound Cleanser (Home Health) 1 x Per Day/30 Days Discharge Instructions: Cleanse the wound with wound cleanser prior to applying a clean dressing using gauze sponges, not tissue or cotton balls. Peri-Wound Care: Triamcinolone 15 (g) (Home Health) 1 x Per Day/30 Days Discharge Instructions: Use triamcinolone 15 (g) as directed Peri-Wound Care: Zinc Oxide Ointment 30g tube 1 x Per Day/30 Days Discharge Instructions: Apply to all red, excoriated area Peri-Wound Care: Sween Lotion (Moisturizing lotion) (Home Health) 1 x Per Day/30 Days Discharge Instructions: Apply moisturizing lotion as directed Prim Dressing: PolyMem Silver Non-Adhesive Dressing, 4.25x4.25 in (Home Health) 1 x Per Day/30 Days ary Discharge Instructions: Apply to wound bed as instructed Secondary Dressing: ABD Pad, 8x10 (Home Health) 1 x Per Day/30 Days Discharge Instructions: Apply over primary dressing as directed. Secondary Dressing: Zetuvit Plus 4x8 in [facility]) 1 x Per Day/30 Days Discharge Instructions: Apply over primary dressing as directed. Compression Wrap: Kerlix Roll 4.5x3.1 (in/yd) (Home Health) 1 x Per Day/30 Days Discharge Instructions: Apply Kerlix and Coban compression as directed. Compression Wrap: Coban Self-Adherent Wrap 4x5 (in/yd) (Home Health) 1 x Per Day/30 Days Discharge Instructions: Apply over Kerlix as directed. Electronic Signature(s) Signed: 10/29/2021 4:32:10 PM By: [name] DO Entered By: [name] on 10/29/2021 15:35:08 -------------------------------------------------------------------------------- Problem List Details Patient Name: Date of Service: [name] [place] [name] [name] 10/29/2021 2:45 PM Medical Record Number:  [id number] Patient Account Number: [id number] Date of Birth/Sex: Treating RN: 07-25-1940 (81 y.o. [name] Primary Care Provider: [name] Other Clinician: Referring Provider: Treating Provider/Extender: Durwin Reges in Treatment: 1 Active Problems ICD-10 Encounter Code Description Active Date MDM Diagnosis [phone] Non-pressure chronic ulcer of other part of right lower leg with fat layer 10/21/2021 No Yes exposed I87.311 Chronic venous hypertension (idiopathic) with ulcer of right  lower extremity 10/21/2021 No Yes E11.622 Type 2 diabetes mellitus with other skin ulcer 10/21/2021 No Yes I48.91 Unspecified atrial fibrillation 10/21/2021 No Yes Z79.01 Long term (current) use of anticoagulants 10/21/2021 No Yes Inactive Problems Resolved Problems Electronic Signature(s) Signed: 10/29/2021 4:32:10 PM By: [name] DO Entered By: [name] on 10/29/2021 15:31:50 -------------------------------------------------------------------------------- Progress Note Details Patient Name: Date of Service: [name] [name] [name] 10/29/2021 2:45 PM Medical Record Number: [id number] Patient Account Number: [id number] Date of Birth/Sex: Treating RN: December 13, 1940 (81 y.o. F) Primary Care Provider: [name] Other Clinician: Referring Provider: Treating Provider/Extender: Durwin Reges in Treatment: 1 Subjective Chief Complaint Information obtained from Patient 10/21/2021; right lower extremity wound History of Present Illness (HPI) Admission 10/21/2021 Ms. Aamna Mallozzi is an 81 year old female with a past medical history of morbid obesity, insulin-dependent type 2 diabetes, atrial fibrillation on Eliquis and lymphedema/venous insufficiency that presents to the clinic for a right lower extremity wound that has been present for the past 2 months. She has a history of wounds that wax and wane in healing over the past 2 years.  She has been following with [facility] for this issue. They have been using Kerlix and Ace bandage along with silver alginate. She is also on torsemide 40 mg daily. She has been taking this dose for the past 5 years. She states she has a lot of drainage from the wound beds and often requires several dressing changes throughout the day. She has home health that comes out twice weekly. She has lymphedema pumps that she uses twice daily. Currently she denies signs of infection. 9/5; patient presents for follow-up. We have been using PolyMem silver under Kerlix/Coban. She states she tolerated the compression wrap well but felt like it was very tight. Home health has been coming out however they did not have any supplies for the patient and has been using ABD pads with an Ace bandage. She was able to follow-up last week for nurse visit. She is able to make it 24 hours without drainage coming completely through the wrap/ace bandage. She denies signs of infection. Patient History Information obtained from Patient. Family History Diabetes - Siblings,Mother, Heart Disease - Mother, Hypertension - Mother, Kidney Disease - Mother, Stroke - Father. Social History Never smoker, Marital Status - Married, Alcohol Use - Never, Drug Use - No History, Caffeine Use - Rarely. Medical History Eyes Patient has history of Cataracts Hematologic/Lymphatic Patient has history of Lymphedema Cardiovascular Patient has history of Arrhythmia - A fib, Hypertension, Peripheral Venous Disease Endocrine Patient has history of Type II Diabetes Musculoskeletal Patient has history of Osteoarthritis Neurologic Patient has history of Neuropathy Medical A Surgical History Notes nd Genitourinary CKD Objective Constitutional respirations regular, non-labored and within target range for patient.. Vitals Time Taken: 2:47 PM, Height: 62 in, Weight: 298 lbs, BMI: 54.5, Temperature: 9/8.1 F, Pulse: 74 bpm,  Respiratory Rate: 22 breaths/min, Blood Pressure: 156/73 mmHg, Capillary Blood Glucose: 137 mg/dl. Cardiovascular 2+ dorsalis pedis/posterior tibialis pulses. Psychiatric pleasant and cooperative. General Notes: Right lower extremity: T the posterior and medial aspect there are open wounds. T the periwound there is significant excoriation. Minimal o o drainage noted on exam. No signs of infection including increased warmth, erythema or purulent drainage. 2+ pitting edema to the knee. Integumentary (Hair, Skin) Wound #1 status is Open. Original cause of wound was Gradually Appeared. The date acquired was: 07/29/2021. The wound has been in treatment 1 weeks. The wound is located on the  Right,Circumferential Lower Leg. The wound measures 6cm length x 7cm width x 0.2cm depth; 32.987cm^2 area and 6.597cm^3 volume. There is Fat Layer (Subcutaneous Tissue) exposed. There is no tunneling or undermining noted. There is a large amount of serous drainage noted. The wound margin is distinct with the outline attached to the wound base. There is large (67-100%) red, pink granulation within the wound bed. There is a small (1- 33%) amount of necrotic tissue within the wound bed including Adherent Slough. Assessment Active Problems ICD-10 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d Chronic venous hypertension (idiopathic) with ulcer of right lower extremity Type 2 diabetes mellitus with other skin ulcer Unspecified atrial fibrillation Long term (current) use of anticoagulants Patient's wounds have improved in size since last clinic visit. She has still a lot of excoriation and I recommended zinc oxide to this area. I recommended continuing the course with PolyMem silver under Kerlix/Coban. I offered a nurse visit later this week for wrap change however patient declined due to having to take care of her husband following a back procedure. Follow-up in 1  week. Plan Follow-up Appointments: Return Appointment in 1 week. - 11/05/21 @ 10:15am with Dr. Heber [place] and [name], RN (Room 7) Anesthetic: (In clinic) Topical Lidocaine 5% applied to wound bed (In clinic) Topical Lidocaine 4% applied to wound bed Bathing/ Shower/ Hygiene: May shower with protection but do not get wound dressing(s) wet. - Can shower with cast protector bag from [facility], [facility] or [place]. Edema Control - Lymphedema / SCD / Other: Lymphedema Pumps. Use Lymphedema pumps on leg(s) 2-3 times a day for 45-60 minutes. If wearing any wraps or hose, do not remove them. Continue exercising as instructed. Elevate legs to the level of the heart or above for 30 minutes daily and/or when sitting, a frequency of: - throughout the day Avoid standing for long periods of time. Additional Orders / Instructions: Follow Nutritious Diet - Monitor/Control Blood Sugar Home Health: Dressing changes to be completed by [place] on Monday / Wednesday / Friday except when patient has scheduled visit at [facility]. - For next few weeks can [place] come 3x week until we get edema under control. Other Home Health Orders/Instructions: [name]: Fax [phone] WOUND #1: - Lower Leg Wound Laterality: Right, Circumferential Cleanser: Soap and Water (Home Health) 1 x Per Day/30 Days Discharge Instructions: May shower and wash wound with dial antibacterial soap and water prior to dressing change. Cleanser: Wound Cleanser (Home Health) 1 x Per Day/30 Days Discharge Instructions: Cleanse the wound with wound cleanser prior to applying a clean dressing using gauze sponges, not tissue or cotton balls. Peri-Wound Care: Triamcinolone 15 (g) (Home Health) 1 x Per Day/30 Days Discharge Instructions: Use triamcinolone 15 (g) as directed Peri-Wound Care: Zinc Oxide Ointment 30g tube 1 x Per Day/30 Days Discharge Instructions: Apply to all red, excoriated area Peri-Wound Care: Sween Lotion (Moisturizing lotion)  (Home Health) 1 x Per Day/30 Days Discharge Instructions: Apply moisturizing lotion as directed Prim Dressing: PolyMem Silver Non-Adhesive Dressing, 4.25x4.25 in (Home Health) 1 x Per Day/30 Days ary Discharge Instructions: Apply to wound bed as instructed Secondary Dressing: ABD Pad, 8x10 (Home Health) 1 x Per Day/30 Days Discharge Instructions: Apply over primary dressing as directed. Secondary Dressing: Zetuvit Plus 4x8 in [facility]) 1 x Per Day/30 Days Discharge Instructions: Apply over primary dressing as directed. Com pression Wrap: Kerlix Roll 4.5x3.1 (in/yd) (Home Health) 1 x Per Day/30 Days Discharge Instructions:  Apply Kerlix and Coban compression as directed. Com pression Wrap: Coban Self-Adherent Wrap 4x5 (in/yd) (Home Health) 1 x Per Day/30 Days Discharge Instructions: Apply over Kerlix as directed. 1. PolyMem silver under Kerlix/Coban 2. Follow-up in 1 week Electronic Signature(s) Signed: 10/29/2021 4:32:10 PM By: [name] DO Entered By: [name] on 10/29/2021 15:35:59 -------------------------------------------------------------------------------- HxROS Details Patient Name: Date of Service: [name] [place], [place] [name] [name] 10/29/2021 2:45 PM Medical Record Number: [id number] Patient Account Number: [id number] Date of Birth/Sex: Treating RN: 14-May-1940 (82 y.o. F) Primary Care Provider: [name] Other Clinician: Referring Provider: Treating Provider/Extender: Durwin Reges in Treatment: 1 Information Obtained From Patient Eyes Medical History: Positive for: Cataracts Hematologic/Lymphatic Medical History: Positive for: Lymphedema Cardiovascular Medical History: Positive for: Arrhythmia - A fib; Hypertension; Peripheral Venous Disease Endocrine Medical History: Positive for: Type II Diabetes Time with diabetes: 30 years Treated with: Insulin Blood sugar tested every day: Yes Tested : 3x day Genitourinary Medical  History: Past Medical History Notes: CKD Musculoskeletal Medical History: Positive for: Osteoarthritis Neurologic Medical History: Positive for: Neuropathy HBO Extended History Items Eyes: Cataracts Immunizations Pneumococcal Vaccine: Received Pneumococcal Vaccination: Yes Received Pneumococcal Vaccination On or After 60th Birthday: Yes Implantable Devices None Family and Social History Diabetes: Yes - Siblings,Mother; Heart Disease: Yes - Mother; Hypertension: Yes - Mother; Kidney Disease: Yes - Mother; Stroke: Yes - Father; Never smoker; Marital Status - Married; Alcohol Use: Never; Drug Use: No History; Caffeine Use: Rarely; Financial Concerns: No; Food, Clothing or Shelter Needs: No; Support System Lacking: No; Transportation Concerns: No Electronic Signature(s) Signed: 10/29/2021 4:32:10 PM By: [name] DO Entered By: [name] on 10/29/2021 15:34:18 -------------------------------------------------------------------------------- SuperBill Details Patient Name: Date of Service: [name], [place] [name] [name] 10/29/2021 Medical Record Number: [id number] Patient Account Number: [id number] Date of Birth/Sex: Treating RN: November 13, 1940 (81 y.o. [name] Primary Care Provider: [name] Other Clinician: Referring Provider: Treating Provider/Extender: Durwin Reges in Treatment: 1 Diagnosis Coding ICD-10 Codes Code Description [phone] 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d I87.311 Chronic venous hypertension (idiopathic) with ulcer of right lower extremity E11.622 Type 2 diabetes mellitus with other skin ulcer I48.91 Unspecified atrial fibrillation Z79.01 Long term (current) use of anticoagulants Facility Procedures CPT4 Code: [id number] Description: 06237 - WOUND CARE VISIT-LEV 3 EST PT Modifier: Quantity: 1 Physician Procedures Electronic Signature(s) Signed: 10/29/2021 4:32:10 PM By:  [name] DO Entered By: [name] on 10/29/2021 15:36:14

## 2021-10-29 NOTE — Progress Notes (Signed)
[name] Bridges, [name] Bridges ([id number]) Visit Report for 10/29/2021 Arrival Information Details Patient Name: Date of Service: [name] Bridges 10/29/2021 2:45 PM Medical Record Number: [id number] Patient Account Number: [id number] Date of Birth/Sex: Treating RN: [birth year] (81 y.o. [name] Primary Care Bentlie Withem: [name] Other Clinician: Referring Iaan Oregel: Treating Nichoals Heyde/Extender: Durwin Reges in Treatment: 1 Visit Information History Since Last Visit Added or deleted any medications: No Patient Arrived: [name] Bridges Any new allergies or adverse reactions: No Arrival Time: 14:37 Had a fall or experienced change in No Transfer Assistance: Manual activities of daily living that may affect Patient Identification Verified: Yes risk of falls: Secondary Verification Process Completed: Yes Signs or symptoms of abuse/neglect since last visito No Patient Requires Transmission-Based Precautions: No Hospitalized since last visit: No Patient Has Alerts: Yes Implantable device outside of the clinic excluding No Patient Alerts: Patient on Blood Thinner cellular tissue based products placed in the center since last visit: Has Dressing in Place as Prescribed: Yes Has Compression in Place as Prescribed: Yes Pain Present Now: No Electronic Signature(s) Signed: 10/29/2021 4:38:31 PM By: Lorrin Jackson Entered By: Lorrin Jackson on 10/29/2021 14:47:01 -------------------------------------------------------------------------------- Clinic Level of Care Assessment Details Patient Name: Date of ServiceJamie Kato [name] 10/29/2021 2:45 PM Medical Record Number: [id number] Patient Account Number: [id number] Date of Birth/Sex: Treating RN: June 06, 1940 (81 y.o. [name] Primary Care Seidy Labreck: [name] Other Clinician: Referring Nevayah Faust: Treating Salar Molden/Extender: Durwin Reges in Treatment: 1 Clinic Level of Care  Assessment Items TOOL 4 Quantity Score X- 1 0 Use when only an EandM is performed on FOLLOW-UP visit ASSESSMENTS - Nursing Assessment / Reassessment X- 1 10 Reassessment of Co-morbidities (includes updates in patient status) X- 1 5 Reassessment of Adherence to Treatment Plan ASSESSMENTS - Wound and Skin A ssessment / Reassessment X - Simple Wound Assessment / Reassessment - one wound 1 5 '[]'$  - 0 Complex Wound Assessment / Reassessment - multiple wounds '[]'$  - 0 Dermatologic / Skin Assessment (not related to wound area) ASSESSMENTS - Focused Assessment '[]'$  - 0 Circumferential Edema Measurements - multi extremities '[]'$  - 0 Nutritional Assessment / Counseling / Intervention '[]'$  - 0 Lower Extremity Assessment (monofilament, tuning fork, pulses) '[]'$  - 0 Peripheral Arterial Disease Assessment (using hand held doppler) ASSESSMENTS - Ostomy and/or Continence Assessment and Care '[]'$  - 0 Incontinence Assessment and Management '[]'$  - 0 Ostomy Care Assessment and Management (repouching, etc.) PROCESS - Coordination of Care '[]'$  - 0 Simple Patient / Family Education for ongoing care X- 1 20 Complex (extensive) Patient / Family Education for ongoing care '[]'$  - 0 Staff obtains Programmer, systems, Records, T Results / Process Orders est X- 1 10 Staff telephones HHA, Nursing Homes / Clarify orders / etc '[]'$  - 0 Routine Transfer to another Facility (non-emergent condition) '[]'$  - 0 Routine Hospital Admission (non-emergent condition) '[]'$  - 0 New Admissions / Biomedical engineer / Ordering NPWT Apligraf, etc. , '[]'$  - 0 Emergency Hospital Admission (emergent condition) '[]'$  - 0 Simple Discharge Coordination '[]'$  - 0 Complex (extensive) Discharge Coordination PROCESS - Special Needs '[]'$  - 0 Pediatric / Minor Patient Management '[]'$  - 0 Isolation Patient Management '[]'$  - 0 Hearing / Language / Visual special needs '[]'$  - 0 Assessment of Community assistance (transportation, D/C planning, etc.) '[]'$  -  0 Additional assistance / Altered mentation '[]'$  - 0 Support Surface(s) Assessment (bed, cushion, seat, etc.) INTERVENTIONS - Wound Cleansing / Measurement X - Simple Wound Cleansing - one wound  1 5 '[]'$  - 0 Complex Wound Cleansing - multiple wounds X- 1 5 Wound Imaging (photographs - any number of wounds) '[]'$  - 0 Wound Tracing (instead of photographs) X- 1 5 Simple Wound Measurement - one wound '[]'$  - 0 Complex Wound Measurement - multiple wounds INTERVENTIONS - Wound Dressings '[]'$  - 0 Small Wound Dressing one or multiple wounds '[]'$  - 0 Medium Wound Dressing one or multiple wounds X- 1 20 Large Wound Dressing one or multiple wounds '[]'$  - 0 Application of Medications - topical '[]'$  - 0 Application of Medications - injection INTERVENTIONS - Miscellaneous '[]'$  - 0 External ear exam '[]'$  - 0 Specimen Collection (cultures, biopsies, blood, body fluids, etc.) '[]'$  - 0 Specimen(s) / Culture(s) sent or taken to Lab for analysis '[]'$  - 0 Patient Transfer (multiple staff / Civil Service fast streamer / Similar devices) '[]'$  - 0 Simple Staple / Suture removal (25 or less) '[]'$  - 0 Complex Staple / Suture removal (26 or more) '[]'$  - 0 Hypo / Hyperglycemic Management (close monitor of Blood Glucose) '[]'$  - 0 Ankle / Brachial Index (ABI) - do not check if billed separately X- 1 5 Vital Signs Has the patient been seen at the hospital within the last three years: Yes Total Score: 90 Level Of Care: New/Established - Level 3 Electronic Signature(s) Signed: 10/29/2021 4:38:31 PM By: Lorrin Jackson Entered By: Lorrin Jackson on 10/29/2021 15:33:13 -------------------------------------------------------------------------------- Encounter Discharge Information Details Patient Name: Date of Service: [name] Bridges, [name] 10/29/2021 2:45 PM Medical Record Number: [id number] Patient Account Number: [id number] Date of Birth/Sex: Treating RN: November 25, 1940 (81 y.o. [name] Primary Care Jeremi Losito: [name] Other  Clinician: Referring Tomasz Steeves: Treating Josemanuel Eakins/Extender: Durwin Reges in Treatment: 1 Encounter Discharge Information Items Discharge Condition: Stable Ambulatory Status: Walker Discharge Destination: Home Transportation: Private Auto Schedule Follow-up Appointment: Yes Clinical Summary of Care: Provided on 10/29/2021 Form Type Recipient Paper Patient Patient Electronic Signature(s) Signed: 10/29/2021 4:38:31 PM By: Lorrin Jackson Entered By: Lorrin Jackson on 10/29/2021 15:45:46 -------------------------------------------------------------------------------- Lower Extremity Assessment Details Patient Name: Date of Service: [name] Bridges, [name] 10/29/2021 2:45 PM Medical Record Number: [id number] Patient Account Number: [id number] Date of Birth/Sex: Treating RN: May 03, 1940 (81 y.o. [name] Primary Care Seven Marengo: [name] Other Clinician: Referring Kimberley Dastrup: Treating Georgana Romain/Extender: Durwin Reges in Treatment: 1 Edema Assessment Assessed: Shirlyn Goltz: No] [name] Bridges: Yes] Edema: [Left: Ye] [Right: s] Calf Left: Right: Point of Measurement: 24 cm From Medial Instep 38.6 cm Ankle Left: Right: Point of Measurement: 7 cm From Medial Instep 28 cm Knee To Floor Left: Right: From Medial Instep 29 cm Vascular Assessment Pulses: Dorsalis Pedis Palpable: [Right:Yes] Electronic Signature(s) Signed: 10/29/2021 4:38:31 PM By: Lorrin Jackson Entered By: Lorrin Jackson on 10/29/2021 14:51:34 -------------------------------------------------------------------------------- Multi Wound Chart Details Patient Name: Date of Service: [name] Bridges, [name] 10/29/2021 2:45 PM Medical Record Number: [id number] Patient Account Number: [id number] Date of Birth/Sex: Treating RN: [birth year] (81 y.o. F) Primary Care Sherilyn Windhorst: [name] Other Clinician: Referring Bonner Larue: Treating Quintella Mura/Extender: Durwin Reges in Treatment: 1 Vital Signs Height(in): 8 Capillary Blood Glucose(mg/dl): 137 Weight(lbs): 298 Pulse(bpm): 42 Body Mass Index(BMI): 54.5 Blood Pressure(mmHg): 156/73 Temperature(F): 9/8.1 Respiratory Rate(breaths/min): 22 Photos: [N/A:N/A] Right, Circumferential Lower Leg N/A N/A Wound Location: Gradually Appeared N/A N/A Wounding Event: Lymphedema N/A N/A Primary Etiology: Cataracts, Lymphedema, Arrhythmia, N/A N/A Comorbid History: Hypertension, Peripheral Venous Disease, Type II Diabetes, Osteoarthritis, Neuropathy 07/29/2021 N/A N/A Date Acquired: 1 N/A N/A Weeks of  Treatment: Open N/A N/A Wound Status: No N/A N/A Wound Recurrence: 6x7x0.2 N/A N/A Measurements L x W x D (cm) 32.987 N/A N/A A (cm) : rea 6.597 N/A N/A Volume (cm) : 79.80% N/A N/A % Reduction in Area: 79.80% N/A N/A % Reduction in Volume: Full Thickness Without Exposed N/A N/A Classification: Support Structures Large N/A N/A Exudate Amount: Serous N/A N/A Exudate Type: amber N/A N/A Exudate Color: Distinct, outline attached N/A N/A Wound Margin: Large (67-100%) N/A N/A Granulation Amount: Red, Pink N/A N/A Granulation Quality: Small (1-33%) N/A N/A Necrotic Amount: Fat Layer (Subcutaneous Tissue): Yes N/A N/A Exposed Structures: Fascia: No Tendon: No Muscle: No Joint: No Bone: No Medium (34-66%) N/A N/A Epithelialization: Treatment Notes Electronic Signature(s) Signed: 10/29/2021 4:32:10 PM By: [name] DO Entered By: [name] on 10/29/2021 15:31:55 -------------------------------------------------------------------------------- Multi-Disciplinary Care Plan Details Patient Name: Date of Service: [name] Bridges, [name] 10/29/2021 2:45 PM Medical Record Number: [id number] Patient Account Number: [id number] Date of Birth/Sex: Treating RN: 02-14-1941 (81 y.o. [name] Primary Care Harvest Deist: [name] Other Clinician: Referring  Candida Vetter: Treating Neilson Oehlert/Extender: Durwin Reges in Treatment: 1 Active Inactive Venous Leg Ulcer Nursing Diagnoses: Actual venous Insuffiency (use after diagnosis is confirmed) Goals: Patient will maintain optimal edema control Date Initiated: 10/21/2021 Target Resolution Date: 11/11/2021 Goal Status: Active Interventions: Assess peripheral edema status every visit. Compression as ordered Provide education on venous insufficiency Treatment Activities: Therapeutic compression applied : 10/21/2021 Notes: Wound/Skin Impairment Nursing Diagnoses: Impaired tissue integrity Goals: Patient/caregiver will verbalize understanding of skin care regimen Date Initiated: 10/21/2021 Target Resolution Date: 11/11/2021 Goal Status: Active Ulcer/skin breakdown will have a volume reduction of 30% by week 4 Date Initiated: 10/21/2021 Target Resolution Date: 11/11/2021 Goal Status: Active Interventions: Assess patient/caregiver ability to obtain necessary supplies Assess patient/caregiver ability to perform ulcer/skin care regimen upon admission and as needed Assess ulceration(s) every visit Provide education on ulcer and skin care Treatment Activities: Topical wound management initiated : 10/21/2021 Notes: Electronic Signature(s) Signed: 10/29/2021 4:38:31 PM By: Lorrin Jackson Entered By: Lorrin Jackson on 10/29/2021 14:36:49 -------------------------------------------------------------------------------- Pain Assessment Details Patient Name: Date of Service: [name] Bridges, [name] 10/29/2021 2:45 PM Medical Record Number: [id number] Patient Account Number: [id number] Date of Birth/Sex: Treating RN: 07-17-1940 (81 y.o. [name] Primary Care Lugene Hitt: [name] Other Clinician: Referring Masayo Fera: Treating Riley Hallum/Extender: Durwin Reges in Treatment: 1 Active Problems Location of Pain Severity and Description of  Pain Patient Has Paino No Site Locations Pain Management and Medication Current Pain Management: Electronic Signature(s) Signed: 10/29/2021 4:38:31 PM By: Lorrin Jackson Entered By: Lorrin Jackson on 10/29/2021 14:47:52 -------------------------------------------------------------------------------- Patient/Caregiver Education Details Patient Name: Date of Service: [name] Bridges, [name] 9/5/2023andnbsp2:45 PM Medical Record Number: [id number] Patient Account Number: [id number] Date of Birth/Gender: Treating RN: 27-Mar-1940 (81 y.o. [name] Primary Care Physician: [name] Other Clinician: Referring Physician: Treating Physician/Extender: Durwin Reges in Treatment: 1 Education Assessment Education Provided To: Patient Education Topics Provided Venous: Methods: Explain/Verbal, Printed Responses: State content correctly Wound/Skin Impairment: Methods: Explain/Verbal, Printed Responses: State content correctly Electronic Signature(s) Signed: 10/29/2021 4:38:31 PM By: Lorrin Jackson Entered By: Lorrin Jackson on 10/29/2021 14:37:06 -------------------------------------------------------------------------------- Wound Assessment Details Patient Name: Date of Service: [name] Bridges, [name] 10/29/2021 2:45 PM Medical Record Number: [id number] Patient Account Number: [id number] Date of Birth/Sex: Treating RN: [birth year] (81 y.o. [name] Primary Care Dustyn Armbrister: [name] Other Clinician: Referring Jeaninne Lodico: Treating Anamarie Hunn/Extender: Heber N[place]  Janne Napoleon, Esperanza Richters in Treatment: 1 Wound Status Wound Number: 1 Primary Lymphedema Etiology: Wound Location: Right, Circumferential Lower Leg Wound Open Wounding Event: Gradually Appeared Status: Date Acquired: 07/29/2021 Comorbid Cataracts, Lymphedema, Arrhythmia, Hypertension, Peripheral Weeks Of Treatment: 1 History: Venous Disease, Type II Diabetes, Osteoarthritis,  Neuropathy Clustered Wound: No Photos Wound Measurements Length: (cm) 6 Width: (cm) 7 Depth: (cm) 0.2 Area: (cm) 32.987 Volume: (cm) 6.597 % Reduction in Area: 79.8% % Reduction in Volume: 79.8% Epithelialization: Medium (34-66%) Tunneling: No Undermining: No Wound Description Classification: Full Thickness Without Exposed Support Structures Wound Margin: Distinct, outline attached Exudate Amount: Large Exudate Type: Serous Exudate Color: amber Foul Odor After Cleansing: No Slough/Fibrino Yes Wound Bed Granulation Amount: Large (67-100%) Exposed Structure Granulation Quality: Red, Pink Fascia Exposed: No Necrotic Amount: Small (1-33%) Fat Layer (Subcutaneous Tissue) Exposed: Yes Necrotic Quality: Adherent Slough Tendon Exposed: No Muscle Exposed: No Joint Exposed: No Bone Exposed: No Treatment Notes Wound #1 (Lower Leg) Wound Laterality: Right, Circumferential Cleanser Soap and Water Discharge Instruction: May shower and wash wound with dial antibacterial soap and water prior to dressing change. Wound Cleanser Discharge Instruction: Cleanse the wound with wound cleanser prior to applying a clean dressing using gauze sponges, not tissue or cotton balls. Peri-Wound Care Triamcinolone 15 (g) Discharge Instruction: Use triamcinolone 15 (g) as directed Zinc Oxide Ointment 30g tube Discharge Instruction: Apply to all red, excoriated area Sween Lotion (Moisturizing lotion) Discharge Instruction: Apply moisturizing lotion as directed Topical Primary Dressing PolyMem Silver Non-Adhesive Dressing, 4.25x4.25 in Discharge Instruction: Apply to wound bed as instructed Secondary Dressing ABD Pad, 8x10 Discharge Instruction: Apply over primary dressing as directed. Zetuvit Plus 4x8 in Discharge Instruction: Apply over primary dressing as directed. Secured With Compression Wrap Kerlix Roll 4.5x3.1 (in/yd) Discharge Instruction: Apply Kerlix and Coban compression as  directed. Coban Self-Adherent Wrap 4x5 (in/yd) Discharge Instruction: Apply over Kerlix as directed. Compression Stockings Add-Ons Electronic Signature(s) Signed: 10/29/2021 4:38:31 PM By: Lorrin Jackson Entered By: Lorrin Jackson on 10/29/2021 14:57:47 -------------------------------------------------------------------------------- Vitals Details Patient Name: Date of Service: [name] Bridges, [name] 10/29/2021 2:45 PM Medical Record Number: [id number] Patient Account Number: [id number] Date of Birth/Sex: Treating RN: [birth year] (81 y.o. [name] Primary Care Sipriano Fendley: [name] Other Clinician: Referring Maison Kestenbaum: Treating Jeorgia Helming/Extender: Durwin Reges in Treatment: 1 Vital Signs Time Taken: 14:47 Temperature (F): 9/8.1 Height (in): 62 Pulse (bpm): 74 Weight (lbs): 298 Respiratory Rate (breaths/min): 22 Body Mass Index (BMI): 54.5 Blood Pressure (mmHg): 156/73 Capillary Blood Glucose (mg/dl): 137 Reference Range: 80 - 120 mg / dl Electronic Signature(s) Signed: 10/29/2021 4:38:31 PM By: Lorrin Jackson Entered By: Lorrin Jackson on 10/29/2021 14:47:46

## 2021-11-05 ENCOUNTER — Encounter (HOSPITAL_BASED_OUTPATIENT_CLINIC_OR_DEPARTMENT_OTHER): Payer: Medicare HMO | Admitting: Internal Medicine

## 2021-11-05 DIAGNOSIS — B372 Candidiasis of skin and nail: Secondary | ICD-10-CM

## 2021-11-05 DIAGNOSIS — I87311 Chronic venous hypertension (idiopathic) with ulcer of right lower extremity: Secondary | ICD-10-CM

## 2021-11-05 DIAGNOSIS — L97812 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d: Secondary | ICD-10-CM

## 2021-11-05 DIAGNOSIS — E11622 Type 2 diabetes mellitus with other skin ulcer: Secondary | ICD-10-CM

## 2021-11-05 NOTE — Progress Notes (Signed)
[name] Bridges, [name] Bridges ([id number]) Visit Report for 11/05/2021 Arrival Information Details Patient Name: Date of Service: [name] Bridges, [name] 11/05/2021 10:15 A M Medical Record Number: [id number] Patient Account Number: [id number] Date of Birth/Sex: Treating RN: Dec 28, 1940 (81 y.o. [name] Bridges Primary Care Jonovan Boedecker: [name] Other Clinician: Referring Waylon Hershey: Treating Madisun Hargrove/Extender: Durwin Reges in Treatment: 2 Visit Information History Since Last Visit Added or deleted any medications: No Patient Arrived: [name] Bridges Any new allergies or adverse reactions: No Arrival Time: 10:10 Had a fall or experienced change in No Transfer Assistance: None activities of daily living that may affect Patient Identification Verified: Yes risk of falls: Secondary Verification Process Completed: Yes Signs or symptoms of abuse/neglect since last visito No Patient Requires Transmission-Based Precautions: No Hospitalized since last visit: No Patient Has Alerts: Yes Implantable device outside of the clinic excluding No Patient Alerts: Patient on Blood Thinner cellular tissue based products placed in the center since last visit: Has Dressing in Place as Prescribed: Yes Has Compression in Place as Prescribed: Yes Pain Present Now: No Electronic Signature(s) Signed: 11/05/2021 4:35:55 PM By: Lorrin Jackson Entered By: Lorrin Jackson on 11/05/2021 10:21:35 -------------------------------------------------------------------------------- Clinic Level of Care Assessment Details Patient Name: Date of Service: [name] Bridges, [name] 11/05/2021 10:15 A M Medical Record Number: [id number] Patient Account Number: [id number] Date of Birth/Sex: Treating RN: 09-23-1940 (81 y.o. [name] Bridges Primary Care Jandel Patriarca: [name] Other Clinician: Referring Zevin Nevares: Treating Manya Balash/Extender: Durwin Reges in Treatment: 2 Clinic Level of Care  Assessment Items TOOL 4 Quantity Score X- 1 0 Use when only an EandM is performed on FOLLOW-UP visit ASSESSMENTS - Nursing Assessment / Reassessment X- 1 10 Reassessment of Co-morbidities (includes updates in patient status) X- 1 5 Reassessment of Adherence to Treatment Plan ASSESSMENTS - Wound and Skin A ssessment / Reassessment X - Simple Wound Assessment / Reassessment - one wound 1 5 '[]'$  - 0 Complex Wound Assessment / Reassessment - multiple wounds '[]'$  - 0 Dermatologic / Skin Assessment (not related to wound area) ASSESSMENTS - Focused Assessment '[]'$  - 0 Circumferential Edema Measurements - multi extremities '[]'$  - 0 Nutritional Assessment / Counseling / Intervention '[]'$  - 0 Lower Extremity Assessment (monofilament, tuning fork, pulses) '[]'$  - 0 Peripheral Arterial Disease Assessment (using hand held doppler) ASSESSMENTS - Ostomy and/or Continence Assessment and Care '[]'$  - 0 Incontinence Assessment and Management '[]'$  - 0 Ostomy Care Assessment and Management (repouching, etc.) PROCESS - Coordination of Care '[]'$  - 0 Simple Patient / Family Education for ongoing care X- 1 20 Complex (extensive) Patient / Family Education for ongoing care '[]'$  - 0 Staff obtains Programmer, systems, Records, T Results / Process Orders est X- 1 10 Staff telephones HHA, Nursing Homes / Clarify orders / etc '[]'$  - 0 Routine Transfer to another Facility (non-emergent condition) '[]'$  - 0 Routine Hospital Admission (non-emergent condition) '[]'$  - 0 New Admissions / Biomedical engineer / Ordering NPWT Apligraf, etc. , '[]'$  - 0 Emergency Hospital Admission (emergent condition) '[]'$  - 0 Simple Discharge Coordination '[]'$  - 0 Complex (extensive) Discharge Coordination PROCESS - Special Needs '[]'$  - 0 Pediatric / Minor Patient Management '[]'$  - 0 Isolation Patient Management '[]'$  - 0 Hearing / Language / Visual special needs '[]'$  - 0 Assessment of Community assistance (transportation, D/C planning, etc.) '[]'$  -  0 Additional assistance / Altered mentation '[]'$  - 0 Support Surface(s) Assessment (bed, cushion, seat, etc.) INTERVENTIONS - Wound Cleansing / Measurement X - Simple Wound Cleansing -  one wound 1 5 '[]'$  - 0 Complex Wound Cleansing - multiple wounds X- 1 5 Wound Imaging (photographs - any number of wounds) '[]'$  - 0 Wound Tracing (instead of photographs) X- 1 5 Simple Wound Measurement - one wound '[]'$  - 0 Complex Wound Measurement - multiple wounds INTERVENTIONS - Wound Dressings '[]'$  - 0 Small Wound Dressing one or multiple wounds '[]'$  - 0 Medium Wound Dressing one or multiple wounds X- 1 20 Large Wound Dressing one or multiple wounds '[]'$  - 0 Application of Medications - topical '[]'$  - 0 Application of Medications - injection INTERVENTIONS - Miscellaneous '[]'$  - 0 External ear exam '[]'$  - 0 Specimen Collection (cultures, biopsies, blood, body fluids, etc.) '[]'$  - 0 Specimen(s) / Culture(s) sent or taken to Lab for analysis '[]'$  - 0 Patient Transfer (multiple staff / Civil Service fast streamer / Similar devices) '[]'$  - 0 Simple Staple / Suture removal (25 or less) '[]'$  - 0 Complex Staple / Suture removal (26 or more) '[]'$  - 0 Hypo / Hyperglycemic Management (close monitor of Blood Glucose) '[]'$  - 0 Ankle / Brachial Index (ABI) - do not check if billed separately X- 1 5 Vital Signs Has the patient been seen at the hospital within the last three years: Yes Total Score: 90 Level Of Care: New/Established - Level 3 Electronic Signature(s) Signed: 11/05/2021 4:35:55 PM By: Lorrin Jackson Entered By: Lorrin Jackson on 11/05/2021 10:48:14 -------------------------------------------------------------------------------- Encounter Discharge Information Details Patient Name: Date of Service: [name] [name], [name] 11/05/2021 10:15 A M Medical Record Number: [id number] Patient Account Number: [id number] Date of Birth/Sex: Treating RN: 13-Jan-1941 (81 y.o. [name] Bridges Primary Care Ayeisha Lindenberger: [name] Other  Clinician: Referring Jonnelle Lawniczak: Treating Taleshia Luff/Extender: Durwin Reges in Treatment: 2 Encounter Discharge Information Items Discharge Condition: Stable Ambulatory Status: Walker Discharge Destination: Home Transportation: Private Auto Schedule Follow-up Appointment: Yes Clinical Summary of Care: Provided on 11/05/2021 Form Type Recipient Paper Patient Patient Electronic Signature(s) Signed: 11/05/2021 4:35:55 PM By: Lorrin Jackson Entered By: Lorrin Jackson on 11/05/2021 11:32:12 -------------------------------------------------------------------------------- Lower Extremity Assessment Details Patient Name: Date of Service: [name] [name], [name] 11/05/2021 10:15 A M Medical Record Number: [id number] Patient Account Number: [id number] Date of Birth/Sex: Treating RN: 1940/12/06 (81 y.o. [name] Bridges Primary Care Labaron Digirolamo: [name] Other Clinician: Referring Yobany Vroom: Treating Alaa Eyerman/Extender: Durwin Reges in Treatment: 2 Edema Assessment Assessed: Shirlyn Goltz: No] Patrice Paradise: Yes] Edema: [Left: Ye] [Right: s] Calf Left: Right: Point of Measurement: 24 cm From Medial Instep 37 cm Ankle Left: Right: Point of Measurement: 7 cm From Medial Instep 27.4 cm Vascular Assessment Pulses: Dorsalis Pedis Palpable: [Right:Yes] Electronic Signature(s) Signed: 11/05/2021 4:35:55 PM By: Lorrin Jackson Entered By: Lorrin Jackson on 11/05/2021 10:26:33 -------------------------------------------------------------------------------- Multi Wound Chart Details Patient Name: Date of Service: [name] [name], [name] 11/05/2021 10:15 A M Medical Record Number: [id number] Patient Account Number: [id number] Date of Birth/Sex: Treating RN: January 26, 1941 (81 y.o. F) Primary Care Danyah Guastella: [name] Other Clinician: Referring Lois Ostrom: Treating Ryon Layton/Extender: Durwin Reges in Treatment: 2 Vital  Signs Height(in): 74 Capillary Blood Glucose(mg/dl): 124 Weight(lbs): 298 Pulse(bpm): 51 Body Mass Index(BMI): 54.5 Blood Pressure(mmHg): 145/63 Temperature(F): 98.2 Respiratory Rate(breaths/min): 20 Photos: [N/A:N/A] Right, Circumferential Lower Leg N/A N/A Wound Location: Gradually Appeared N/A N/A Wounding Event: Lymphedema N/A N/A Primary Etiology: Cataracts, Lymphedema, Arrhythmia, N/A N/A Comorbid History: Hypertension, Peripheral Venous Disease, Type II Diabetes, Osteoarthritis, Neuropathy 07/29/2021 N/A N/A Date Acquired: 2 N/A N/A Weeks of Treatment: Open N/A N/A Wound  Status: No N/A N/A Wound Recurrence: 2x2x0.1 N/A N/A Measurements L x W x D (cm) 3.142 N/A N/A A (cm) : rea 0.314 N/A N/A Volume (cm) : 98.10% N/A N/A % Reduction in Area: 99.00% N/A N/A % Reduction in Volume: Full Thickness Without Exposed N/A N/A Classification: Support Structures Large N/A N/A Exudate Amount: Serous N/A N/A Exudate Type: amber N/A N/A Exudate Color: Distinct, outline attached N/A N/A Wound Margin: Large (67-100%) N/A N/A Granulation Amount: Red, Pink N/A N/A Granulation Quality: Small (1-33%) N/A N/A Necrotic Amount: Fat Layer (Subcutaneous Tissue): Yes N/A N/A Exposed Structures: Fascia: No Tendon: No Muscle: No Joint: No Bone: No Large (67-100%) N/A N/A Epithelialization: Treatment Notes Electronic Signature(s) Signed: 11/05/2021 11:20:59 AM By: [name] DO Entered By: [name] on 11/05/2021 10:54:47 -------------------------------------------------------------------------------- Multi-Disciplinary Care Plan Details Patient Name: Date of Service: [name] Bridges, [name] 11/05/2021 10:15 A M Medical Record Number: [id number] Patient Account Number: [id number] Date of Birth/Sex: Treating RN: [birth year] (81 y.o. [name] Bridges Primary Care Jaylynn Siefert: [name] Other Clinician: Referring Karla Pavone: Treating Deforrest Bogle/Extender:  Durwin Reges in Treatment: 2 Active Inactive Venous Leg Ulcer Nursing Diagnoses: Actual venous Insuffiency (use after diagnosis is confirmed) Goals: Patient will maintain optimal edema control Date Initiated: 10/21/2021 Target Resolution Date: 11/11/2021 Goal Status: Active Interventions: Assess peripheral edema status every visit. Compression as ordered Provide education on venous insufficiency Treatment Activities: Therapeutic compression applied : 10/21/2021 Notes: Wound/Skin Impairment Nursing Diagnoses: Impaired tissue integrity Goals: Patient/caregiver will verbalize understanding of skin care regimen Date Initiated: 10/21/2021 Target Resolution Date: 11/11/2021 Goal Status: Active Ulcer/skin breakdown will have a volume reduction of 30% by week 4 Date Initiated: 10/21/2021 Target Resolution Date: 11/11/2021 Goal Status: Active Interventions: Assess patient/caregiver ability to obtain necessary supplies Assess patient/caregiver ability to perform ulcer/skin care regimen upon admission and as needed Assess ulceration(s) every visit Provide education on ulcer and skin care Treatment Activities: Topical wound management initiated : 10/21/2021 Notes: Electronic Signature(s) Signed: 11/05/2021 4:35:55 PM By: Lorrin Jackson Entered By: Lorrin Jackson on 11/05/2021 10:10:12 -------------------------------------------------------------------------------- Pain Assessment Details Patient Name: Date of Service: [name] [name], [name] 11/05/2021 10:15 A M Medical Record Number: [id number] Patient Account Number: [id number] Date of Birth/Sex: Treating RN: May 17, 1940 (81 y.o. [name] Bridges Primary Care Dickie Labarre: [name] Other Clinician: Referring Ritvik Mczeal: Treating Lakeitha Basques/Extender: Durwin Reges in Treatment: 2 Active Problems Location of Pain Severity and Description of Pain Patient Has Paino No Site  Locations Pain Management and Medication Current Pain Management: Electronic Signature(s) Signed: 11/05/2021 4:35:55 PM By: Lorrin Jackson Entered By: Lorrin Jackson on 11/05/2021 10:22:40 -------------------------------------------------------------------------------- Patient/Caregiver Education Details Patient Name: Date of Service: [name] Bridges, [name] 9/12/2023andnbsp10:15 A M Medical Record Number: [id number] Patient Account Number: [id number] Date of Birth/Gender: Treating RN: 1940/09/04 (81 y.o. [name] Bridges Primary Care Physician: [name] Other Clinician: Referring Physician: Treating Physician/Extender: Durwin Reges in Treatment: 2 Education Assessment Education Provided To: Patient Education Topics Provided Venous: Methods: Explain/Verbal, Printed Responses: State content correctly Wound/Skin Impairment: Methods: Explain/Verbal, Printed Responses: State content correctly Electronic Signature(s) Signed: 11/05/2021 4:35:55 PM By: Lorrin Jackson Entered By: Lorrin Jackson on 11/05/2021 10:10:30 -------------------------------------------------------------------------------- Wound Assessment Details Patient Name: Date of Service: [name] [name], [name] 11/05/2021 10:15 A M Medical Record Number: [id number] Patient Account Number: [id number] Date of Birth/Sex: Treating RN: [birth year] (81 y.o. [name] Bridges Primary Care Kyliegh Jester: [name] Other Clinician: Referring Sephiroth Mcluckie: Treating Ananya Mccleese/Extender: [name]  [name] Weeks in Treatment: 2 Wound Status Wound Number: 1 Primary Lymphedema Etiology: Wound Location: Right, Circumferential Lower Leg Wound Open Wounding Event: Gradually Appeared Status: Date Acquired: 07/29/2021 Comorbid Cataracts, Lymphedema, Arrhythmia, Hypertension, Peripheral Weeks Of Treatment: 2 History: Venous Disease, Type II Diabetes, Osteoarthritis, Neuropathy Clustered  Wound: No Photos Wound Measurements Length: (cm) 2 Width: (cm) 2 Depth: (cm) 0.1 Area: (cm) 3.142 Volume: (cm) 0.314 % Reduction in Area: 98.1% % Reduction in Volume: 99% Epithelialization: Large (67-100%) Tunneling: No Undermining: No Wound Description Classification: Full Thickness Without Exposed Support Structures Wound Margin: Distinct, outline attached Exudate Amount: Large Exudate Type: Serous Exudate Color: amber Foul Odor After Cleansing: No Slough/Fibrino Yes Wound Bed Granulation Amount: Large (67-100%) Exposed Structure Granulation Quality: Red, Pink Fascia Exposed: No Necrotic Amount: Small (1-33%) Fat Layer (Subcutaneous Tissue) Exposed: Yes Necrotic Quality: Adherent Slough Tendon Exposed: No Muscle Exposed: No Joint Exposed: No Bone Exposed: No Treatment Notes Wound #1 (Lower Leg) Wound Laterality: Right, Circumferential Cleanser Soap and Water Discharge Instruction: May shower and wash wound with dial antibacterial soap and water prior to dressing change. Wound Cleanser Discharge Instruction: Cleanse the wound with wound cleanser prior to applying a clean dressing using gauze sponges, not tissue or cotton balls. Peri-Wound Care Ketoconazole Cream 2% Discharge Instruction: Apply Ketoconazole as directed Triamcinolone 15 (g) Discharge Instruction: Use triamcinolone 15 (g) as directed Sween Lotion (Moisturizing lotion) Discharge Instruction: Apply moisturizing lotion as directed Topical Primary Dressing PolyMem Silver Non-Adhesive Dressing, 4.25x4.25 in Discharge Instruction: Apply to wound bed as instructed Secondary Dressing ABD Pad, 8x10 Discharge Instruction: Apply over primary dressing as directed. Zetuvit Plus 4x8 in Discharge Instruction: Apply over primary dressing as directed. Secured With Compression Wrap Kerlix Roll 4.5x3.1 (in/yd) Discharge Instruction: Apply Kerlix and Coban compression as directed. Coban Self-Adherent Wrap 4x5  (in/yd) Discharge Instruction: Apply over Kerlix as directed. Compression Stockings Add-Ons Electronic Signature(s) Signed: 11/05/2021 4:35:55 PM By: Lorrin Jackson Entered By: Lorrin Jackson on 11/05/2021 10:32:18 -------------------------------------------------------------------------------- Vitals Details Patient Name: Date of Service: [name] [name], [name] 11/05/2021 10:15 A M Medical Record Number: [id number] Patient Account Number: [id number] Date of Birth/Sex: Treating RN: [birth year] (81 y.o. [name] Bridges Primary Care Gabrielle Wakeland: [name] Other Clinician: Referring Jazlin Tapscott: Treating Carilyn Woolston/Extender: Durwin Reges in Treatment: 2 Vital Signs Time Taken: 10:20 Temperature (F): 98.2 Height (in): 62 Pulse (bpm): 79 Weight (lbs): 298 Respiratory Rate (breaths/min): 20 Body Mass Index (BMI): 54.5 Blood Pressure (mmHg): 145/63 Capillary Blood Glucose (mg/dl): 124 Reference Range: 80 - 120 mg / dl Electronic Signature(s) Signed: 11/05/2021 4:35:55 PM By: Lorrin Jackson Entered By: Lorrin Jackson on 11/05/2021 10:22:32

## 2021-11-05 NOTE — Progress Notes (Signed)
[name] Bridges, [name] Bridges ([id number]) Visit Report for 11/05/2021 Chief Complaint Document Details Patient Name: Date of Service: [name], [place] [name] [name] 11/05/2021 10:15 A M Medical Record Number: [id number] Patient Account Number: [id number] Date of Birth/Sex: Treating RN: 09-11-1940 (81 y.o. F) Primary Care Provider: [name] Other Clinician: Referring Provider: Treating Provider/Extender: Durwin Reges in Treatment: 2 Information Obtained from: Patient Chief Complaint 10/21/2021; right lower extremity wound Electronic Signature(s) Signed: 11/05/2021 11:20:59 AM By: [name] DO Entered By: [name] on 11/05/2021 10:54:53 -------------------------------------------------------------------------------- HPI Details Patient Name: Date of Service: [name] Bridges, [place] [name] [name] 11/05/2021 10:15 A M Medical Record Number: [id number] Patient Account Number: [id number] Date of Birth/Sex: Treating RN: [birth year] (81 y.o. F) Primary Care Provider: [name] Other Clinician: Referring Provider: Treating Provider/Extender: Durwin Reges in Treatment: 2 History of Present Illness HPI Description: Admission 10/21/2021 [name] Bridges is an 81 year old female with a past medical history of morbid obesity, insulin-dependent type 2 diabetes, atrial fibrillation on Eliquis and lymphedema/venous insufficiency that presents to the clinic for a right lower extremity wound that has been present for the past 2 months. She has a history of wounds that wax and wane in healing over the past 2 years. She has been following with [facility] for this issue. They have been using Kerlix and Ace bandage along with silver alginate. She is also on torsemide 40 mg daily. She has been taking this dose for the past 5 years. She states she has a lot of drainage from the wound beds and often requires several dressing changes throughout the day. She  has home health that comes out twice weekly. She has lymphedema pumps that she uses twice daily. Currently she denies signs of infection. 9/5; patient presents for follow-up. We have been using PolyMem silver under Kerlix/Coban. She states she tolerated the compression wrap well but felt like it was very tight. Home health has been coming out however they did not have any supplies for the patient and has been using ABD pads with an Ace bandage. She was able to follow-up last week for nurse visit. She is able to make it 24 hours without drainage coming completely through the wrap/ace bandage. She denies signs of infection. 9/12; patient presents for follow-up. We have been using PolyMem silver under Kerlix/Coban. She reports less drainage from the wound bed and can last almost 2 days without a wrap change. Electronic Signature(s) Signed: 11/05/2021 11:20:59 AM By: [name] DO Entered By: [name] on 11/05/2021 10:55:35 -------------------------------------------------------------------------------- Physical Exam Details Patient Name: Date of Service: [name] [place], [place] [name] [name] 11/05/2021 10:15 A M Medical Record Number: [id number] Patient Account Number: [id number] Date of Birth/Sex: Treating RN: 1940-08-28 (81 y.o. F) Primary Care Provider: [name] Other Clinician: Referring Provider: Treating Provider/Extender: Durwin Reges in Treatment: 2 Constitutional respirations regular, non-labored and within target range for patient.. Cardiovascular 2+ dorsalis pedis/posterior tibialis pulses. Psychiatric pleasant and cooperative. Notes Right lower extremity: T the posterior and medial aspect there are open wounds. T the periwound there is significant erythema with a shiny film. Minimal o o drainage noted on exam. No signs of infection including increased warmth or purulent drainage. 2+ pitting edema to the knee. Electronic Signature(s) Signed:  11/05/2021 11:20:59 AM By: [name] DO Entered By: [name] on 11/05/2021 10:56:50 -------------------------------------------------------------------------------- Physician Orders Details Patient Name: Date of Service: [name] Bridges, [place] [name] [name] 11/05/2021 10:15 A M Medical Record  Number: [id number] Patient Account Number: [id number] Date of Birth/Sex: Treating RN: 26-Jan-1941 (81 y.o. [name] Primary Care Provider: [name] Other Clinician: Referring Provider: Treating Provider/Extender: Durwin Reges in Treatment: 2 Verbal / Phone Orders: No Diagnosis Coding ICD-10 Coding Code Description [phone] 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d I87.311 Chronic venous hypertension (idiopathic) with ulcer of right lower extremity E11.622 Type 2 diabetes mellitus with other skin ulcer I48.91 Unspecified atrial fibrillation Z79.01 Long term (current) use of anticoagulants Follow-up Appointments ppointment in 1 week. - with Dr. Heber [place] and [name], RN (Room 7) Return A Anesthetic (In clinic) Topical Lidocaine 5% applied to wound bed (In clinic) Topical Lidocaine 4% applied to wound bed Bathing/ Shower/ Hygiene May shower with protection but do not get wound dressing(s) wet. - Can shower with cast protector bag from [facility], [facility] or [place]. Edema Control - Lymphedema / SCD / Other Lymphedema Pumps. Use Lymphedema pumps on leg(s) 2-3 times a day for 45-60 minutes. If wearing any wraps or hose, do not remove them. Continue exercising as instructed. Elevate legs to the level of the heart or above for 30 minutes daily and/or when sitting, a frequency of: - throughout the day Avoid standing for long periods of time. Additional Orders / Instructions Follow Nutritious Diet - Monitor/Control Blood Sugar Home Health Dressing changes to be completed by [place] on Monday / Wednesday / Friday except when  patient has scheduled visit at [place] to come 2x week, preferably Mon and Fri [place] Orders/Instructions: [name]: Fax [phone] Wound Treatment Wound #1 - Lower Leg Wound Laterality: Right, Circumferential Cleanser: Soap and Water ([place]) 1 x Per Day/30 Days Discharge Instructions: May shower and wash wound with dial antibacterial soap and water prior to dressing change. Cleanser: Wound Cleanser (Home Health) 1 x Per Day/30 Days Discharge Instructions: Cleanse the wound with wound cleanser prior to applying a clean dressing using gauze sponges, not tissue or cotton balls. Peri-Wound Care: Ketoconazole Cream 2% 1 x Per Day/30 Days Discharge Instructions: Apply Ketoconazole as directed Peri-Wound Care: Triamcinolone 15 (g) (Home Health) 1 x Per Day/30 Days Discharge Instructions: Use triamcinolone 15 (g) as directed Peri-Wound Care: Sween Lotion (Moisturizing lotion) (Home Health) 1 x Per Day/30 Days Discharge Instructions: Apply moisturizing lotion as directed Prim Dressing: PolyMem Silver Non-Adhesive Dressing, 4.25x4.25 in (Home Health) 1 x Per Day/30 Days ary Discharge Instructions: Apply to wound bed as instructed Secondary Dressing: ABD Pad, 8x10 (Home Health) 1 x Per Day/30 Days Discharge Instructions: Apply over primary dressing as directed. Secondary Dressing: Zetuvit Plus 4x8 in [facility]) 1 x Per Day/30 Days Discharge Instructions: Apply over primary dressing as directed. Compression Wrap: Kerlix Roll 4.5x3.1 (in/yd) (Home Health) 1 x Per Day/30 Days Discharge Instructions: Apply Kerlix and Coban compression as directed. Compression Wrap: Coban Self-Adherent Wrap 4x5 (in/yd) (Home Health) 1 x Per Day/30 Days Discharge Instructions: Apply over Kerlix as directed. Patient Medications llergies: clindamycin, codeine, gabapentin, Statins-HMG-CoA Reductase Inhibitors, sulfamethoxazole A Notifications Medication Indication Start  End 11/05/2021 ketoconazole DOSE 1 - topical 2 % cream - 1 cream topical Electronic Signature(s) Signed: 11/05/2021 11:20:59 AM By: [name] DO Previous Signature: 11/05/2021 11:02:56 AM Version By: [name] DO Entered By: [name] on 11/05/2021 11:03:06 -------------------------------------------------------------------------------- Problem List Details Patient Name: Date of Service: [name] [place] [name] [name] 11/05/2021 10:15 A M Medical Record Number: [id number] Patient Account Number: [id number] Date of  Birth/Sex: Treating RN: March 31, 1940 (81 y.o. [name] Primary Care Provider: [name] Other Clinician: Referring Provider: Treating Provider/Extender: Durwin Reges in Treatment: 2 Active Problems ICD-10 Encounter Code Description Active Date MDM Diagnosis [phone] Non-pressure chronic ulcer of other part of right lower leg with fat layer 10/21/2021 No Yes exposed I87.311 Chronic venous hypertension (idiopathic) with ulcer of right lower extremity 10/21/2021 No Yes E11.622 Type 2 diabetes mellitus with other skin ulcer 10/21/2021 No Yes I48.91 Unspecified atrial fibrillation 10/21/2021 No Yes Z79.01 Long term (current) use of anticoagulants 10/21/2021 No Yes B37.2 Candidiasis of skin and nail 11/05/2021 No Yes Inactive Problems Resolved Problems Electronic Signature(s) Signed: 11/05/2021 11:20:59 AM By: [name] DO Entered By: [name] on 11/05/2021 11:00:50 -------------------------------------------------------------------------------- Progress Note Details Patient Name: Date of Service: [name] Bridges[place] [name] [name] 11/05/2021 10:15 A M Medical Record Number: [id number] Patient Account Number: [id number] Date of Birth/Sex: Treating RN: [birth year] (81 y.o. F) Primary Care Provider: [name] Other Clinician: Referring Provider: Treating Provider/Extender: Durwin Reges in  Treatment: 2 Subjective Chief Complaint Information obtained from Patient 10/21/2021; right lower extremity wound History of Present Illness (HPI) Admission 10/21/2021 Ms. [name] Bridges is an 81 year old female with a past medical history of morbid obesity, insulin-dependent type 2 diabetes, atrial fibrillation on Eliquis and lymphedema/venous insufficiency that presents to the clinic for a right lower extremity wound that has been present for the past 2 months. She has a history of wounds that wax and wane in healing over the past 2 years. She has been following with [facility] for this issue. They have been using Kerlix and Ace bandage along with silver alginate. She is also on torsemide 40 mg daily. She has been taking this dose for the past 5 years. She states she has a lot of drainage from the wound beds and often requires several dressing changes throughout the day. She has home health that comes out twice weekly. She has lymphedema pumps that she uses twice daily. Currently she denies signs of infection. 9/5; patient presents for follow-up. We have been using PolyMem silver under Kerlix/Coban. She states she tolerated the compression wrap well but felt like it was very tight. Home health has been coming out however they did not have any supplies for the patient and has been using ABD pads with an Ace bandage. She was able to follow-up last week for nurse visit. She is able to make it 24 hours without drainage coming completely through the wrap/ace bandage. She denies signs of infection. 9/12; patient presents for follow-up. We have been using PolyMem silver under Kerlix/Coban. She reports less drainage from the wound bed and can last almost 2 days without a wrap change. Patient History Information obtained from Patient. Family History Diabetes - Siblings,Mother, Heart Disease - Mother, Hypertension - Mother, Kidney Disease - Mother, Stroke - Father. Social History Never  smoker, Marital Status - Married, Alcohol Use - Never, Drug Use - No History, Caffeine Use - Rarely. Medical History Eyes Patient has history of Cataracts Hematologic/Lymphatic Patient has history of Lymphedema Cardiovascular Patient has history of Arrhythmia - A fib, Hypertension, Peripheral Venous Disease Endocrine Patient has history of Type II Diabetes Musculoskeletal Patient has history of Osteoarthritis Neurologic Patient has history of Neuropathy Medical A Surgical History Notes nd Genitourinary CKD Objective Constitutional respirations regular, non-labored and within target range for patient.. Vitals Time Taken: 10:20 AM, Height: 62 in, Weight: 298 lbs, BMI: 54.5, Temperature: 98.2 F,  Pulse: 79 bpm, Respiratory Rate: 20 breaths/min, Blood Pressure: 145/63 mmHg, Capillary Blood Glucose: 124 mg/dl. Cardiovascular 2+ dorsalis pedis/posterior tibialis pulses. Psychiatric pleasant and cooperative. General Notes: Right lower extremity: T the posterior and medial aspect there are open wounds. T the periwound there is significant erythema with a shiny o o film. Minimal drainage noted on exam. No signs of infection including increased warmth or purulent drainage. 2+ pitting edema to the knee. Integumentary (Hair, Skin) Wound #1 status is Open. Original cause of wound was Gradually Appeared. The date acquired was: 07/29/2021. The wound has been in treatment 2 weeks. The wound is located on the Right,Circumferential Lower Leg. The wound measures 2cm length x 2cm width x 0.1cm depth; 3.142cm^2 area and 0.314cm^3 volume. There is Fat Layer (Subcutaneous Tissue) exposed. There is no tunneling or undermining noted. There is a large amount of serous drainage noted. The wound margin is distinct with the outline attached to the wound base. There is large (67-100%) red, pink granulation within the wound bed. There is a small (1-33%) amount of necrotic tissue within the wound bed including  Adherent Slough. Assessment Active Problems ICD-10 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d Chronic venous hypertension (idiopathic) with ulcer of right lower extremity Type 2 diabetes mellitus with other skin ulcer Unspecified atrial fibrillation Long term (current) use of anticoagulants Candidiasis of skin and nail Patient's wounds have shown improvement in size and appearance since last clinic visit. Her drainage has also improved. I suspect that the erythema to the periwound is likely a fungal infection. She does report itching to this area. I think she would benefit from ketoconazole under the wrap. I will prescribe this to her. Continue PolyMem silver under Kerlix/Coban. Plan Follow-up Appointments: Return Appointment in 1 week. - with Dr. Heber [place] and [name], RN (Room 7) Anesthetic: (In clinic) Topical Lidocaine 5% applied to wound bed (In clinic) Topical Lidocaine 4% applied to wound bed Bathing/ Shower/ Hygiene: May shower with protection but do not get wound dressing(s) wet. - Can shower with cast protector bag from [facility], [facility] or [place]. Edema Control - Lymphedema / SCD / Other: Lymphedema Pumps. Use Lymphedema pumps on leg(s) 2-3 times a day for 45-60 minutes. If wearing any wraps or hose, do not remove them. Continue exercising as instructed. Elevate legs to the level of the heart or above for 30 minutes daily and/or when sitting, a frequency of: - throughout the day Avoid standing for long periods of time. Additional Orders / Instructions: Follow Nutritious Diet - Monitor/Control Blood Sugar Home Health: Dressing changes to be completed by [place] on Monday / Wednesday / Friday except when patient has scheduled visit at [place] to come 2x week, preferably Mon and Fri Other [place] Orders/Instructions: [name]: Fax [phone] The following medication(s) was prescribed: ketoconazole topical 2 % cream  1 1 cream topical starting 11/05/2021 WOUND #1: - Lower Leg Wound Laterality: Right, Circumferential Cleanser: Soap and Water ([place]) 1 x Per Day/30 Days Discharge Instructions: May shower and wash wound with dial antibacterial soap and water prior to dressing change. Cleanser: Wound Cleanser (Home Health) 1 x Per Day/30 Days Discharge Instructions: Cleanse the wound with wound cleanser prior to applying a clean dressing using gauze sponges, not tissue or cotton balls. Peri-Wound Care: Ketoconazole Cream 2% 1 x Per Day/30 Days Discharge Instructions: Apply Ketoconazole as directed Peri-Wound Care: Triamcinolone 15 (g) (Home Health) 1 x Per Day/30 Days  Discharge Instructions: Use triamcinolone 15 (g) as directed Peri-Wound Care: Sween Lotion (Moisturizing lotion) (Home Health) 1 x Per Day/30 Days Discharge Instructions: Apply moisturizing lotion as directed Prim Dressing: PolyMem Silver Non-Adhesive Dressing, 4.25x4.25 in (Home Health) 1 x Per Day/30 Days ary Discharge Instructions: Apply to wound bed as instructed Secondary Dressing: ABD Pad, 8x10 (Home Health) 1 x Per Day/30 Days Discharge Instructions: Apply over primary dressing as directed. Secondary Dressing: Zetuvit Plus 4x8 in [facility]) 1 x Per Day/30 Days Discharge Instructions: Apply over primary dressing as directed. Com pression Wrap: Kerlix Roll 4.5x3.1 (in/yd) (Home Health) 1 x Per Day/30 Days Discharge Instructions: Apply Kerlix and Coban compression as directed. Com pression Wrap: Coban Self-Adherent Wrap 4x5 (in/yd) (Home Health) 1 x Per Day/30 Days Discharge Instructions: Apply over Kerlix as directed. 1. PolyMem silver under Kerlix/Coban 2. Ketoconazole 3. Follow-up in 1 week Electronic Signature(s) Signed: 11/05/2021 11:20:59 AM By: [name] DO Entered By: [name] on 11/05/2021 11:03:13 -------------------------------------------------------------------------------- HxROS Details Patient  Name: Date of Service: [name] Bridges, [place] [name] [name] 11/05/2021 10:15 A M Medical Record Number: [id number] Patient Account Number: [id number] Date of Birth/Sex: Treating RN: 11/15/1940 (81 y.o. F) Primary Care Provider: [name] Other Clinician: Referring Provider: Treating Provider/Extender: Durwin Reges in Treatment: 2 Information Obtained From Patient Eyes Medical History: Positive for: Cataracts Hematologic/Lymphatic Medical History: Positive for: Lymphedema Cardiovascular Medical History: Positive for: Arrhythmia - A fib; Hypertension; Peripheral Venous Disease Endocrine Medical History: Positive for: Type II Diabetes Time with diabetes: 30 years Treated with: Insulin Blood sugar tested every day: Yes Tested : 3x day Genitourinary Medical History: Past Medical History Notes: CKD Musculoskeletal Medical History: Positive for: Osteoarthritis Neurologic Medical History: Positive for: Neuropathy HBO Extended History Items Eyes: Cataracts Immunizations Pneumococcal Vaccine: Received Pneumococcal Vaccination: Yes Received Pneumococcal Vaccination On or After 60th Birthday: Yes Implantable Devices None Family and Social History Diabetes: Yes - Siblings,Mother; Heart Disease: Yes - Mother; Hypertension: Yes - Mother; Kidney Disease: Yes - Mother; Stroke: Yes - Father; Never smoker; Marital Status - Married; Alcohol Use: Never; Drug Use: No History; Caffeine Use: Rarely; Financial Concerns: No; Food, Clothing or Shelter Needs: No; Support System Lacking: No; Transportation Concerns: No Electronic Signature(s) Signed: 11/05/2021 11:20:59 AM By: [name] DO Entered By: [name] on 11/05/2021 10:55:44 -------------------------------------------------------------------------------- SuperBill Details Patient Name: Date of Service: [name] Bridges, [place] [name] [name] 11/05/2021 Medical Record Number: [id number] Patient Account Number:  [id number] Date of Birth/Sex: Treating RN: Apr 20, 1940 (81 y.o. [name] Primary Care Provider: [name] Other Clinician: Referring Provider: Treating Provider/Extender: Durwin Reges in Treatment: 2 Diagnosis Coding ICD-10 Codes Code Description [phone] 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d I87.311 Chronic venous hypertension (idiopathic) with ulcer of right lower extremity E11.622 Type 2 diabetes mellitus with other skin ulcer I48.91 Unspecified atrial fibrillation Z79.01 Long term (current) use of anticoagulants B37.2 Candidiasis of skin and nail Facility Procedures CPT4 Code: [id number] Description: 99213 - WOUND CARE VISIT-LEV 3 EST PT Modifier: Quantity: 1 Physician Procedures : CPT4 Code Description Modifier [id number] - WC PHYS LEVEL 4 - EST PT ICD-10 Diagnosis Description W29.937 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d I87.311 Chronic venous hypertension (idiopathic) with ulcer  of right lower extremity E11.622 Type 2 diabetes mellitus with other skin ulcer B37.2 Candidiasis of skin and nail Quantity: 1 Electronic Signature(s) Signed: 11/05/2021 11:20:59 AM By: [name] DO  Entered By: [name] on 11/05/2021 11:03:23

## 2021-11-12 ENCOUNTER — Encounter (HOSPITAL_BASED_OUTPATIENT_CLINIC_OR_DEPARTMENT_OTHER): Payer: Medicare HMO | Admitting: Internal Medicine

## 2021-11-12 DIAGNOSIS — I87311 Chronic venous hypertension (idiopathic) with ulcer of right lower extremity: Secondary | ICD-10-CM

## 2021-11-12 DIAGNOSIS — E11622 Type 2 diabetes mellitus with other skin ulcer: Secondary | ICD-10-CM

## 2021-11-12 DIAGNOSIS — L97812 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d: Secondary | ICD-10-CM

## 2021-11-12 DIAGNOSIS — I4891 Unspecified atrial fibrillation: Secondary | ICD-10-CM

## 2021-11-12 NOTE — Progress Notes (Signed)
[name] Bridges, KOC ([id number]) Visit Report for 11/12/2021 Physician Orders Details Patient Name: Date of Service: [name], [place] [name] [name] 11/12/2021 3:30 PM Medical Record Number: [id number] Patient Account Number: [id number] Date of Birth/Sex: Treating RN: [birth year] (81 y.o. [name] Bridges Primary Care Provider: [name] Other Clinician: Referring Provider: Treating Provider/Extender: Durwin Reges in Treatment: 3 Verbal / Phone Orders: No Diagnosis Coding Follow-up Appointments ppointment in 1 week. - with Dr. Heber Mayking and [name], RN (Room 7) Return A Bathing/ Shower/ Hygiene May shower with protection but do not get wound dressing(s) wet. - Can shower with cast protector bag from [facility] or [place]. Edema Control - Lymphedema / SCD / Other Lymphedema Pumps. Use Lymphedema pumps on leg(s) 2-3 times a day for 45-60 minutes. If wearing any wraps or hose, do not remove them. Continue exercising as instructed. Elevate legs to the level of the heart or above for 30 minutes daily and/or when sitting, a frequency of: - throughout the day Avoid standing for long periods of time. Additional Orders / Instructions Follow Nutritious Diet - Monitor/Control Blood Sugar Home Health New wound care orders this week; continue Home Health for wound care. May utilize formulary equivalent dressing for wound treatment orders unless otherwise specified. - use super absorptive dressing to lower right leg Dressing changes to be completed by [facility] on Monday / Wednesday / Friday except when patient has scheduled visit at [facility] to come 3x week Other Home Health Orders/Instructions: [name]: Fax [phone] Wound Treatment Wound #1 - Lower Leg Wound Laterality: Right, Circumferential Cleanser: Soap and Water ([place]) 1 x Per Day/30 Days Discharge Instructions: May shower and wash wound with dial antibacterial soap and water prior to  dressing change. Cleanser: Wound Cleanser (Home Health) 1 x Per Day/30 Days Discharge Instructions: Cleanse the wound with wound cleanser prior to applying a clean dressing using gauze sponges, not tissue or cotton balls. Peri-Wound Care: Ketoconazole Cream 2% 1 x Per Day/30 Days Discharge Instructions: Apply Ketoconazole as directed Peri-Wound Care: Triamcinolone 15 (g) (Home Health) 1 x Per Day/30 Days Discharge Instructions: Use triamcinolone 15 (g) as directed Peri-Wound Care: Sween Lotion (Moisturizing lotion) (Home Health) 1 x Per Day/30 Days Discharge Instructions: Apply moisturizing lotion as directed Prim Dressing: PolyMem Silver Non-Adhesive Dressing, 4.25x4.25 in (Home Health) 1 x Per Day/30 Days ary Discharge Instructions: Apply to wound bed as instructed Secondary Dressing: ABD Pad, 8x10 (Home Health) 1 x Per Day/30 Days Discharge Instructions: Apply over primary dressing as directed. Secondary Dressing: Zetuvit Plus 4x8 in [facility]) 1 x Per Day/30 Days Discharge Instructions: Apply over primary dressing as directed. Secondary Dressing: Eclypse Super Absorbent Dressing, 7.9x11.80 (in/in) 1 x Per Day/30 Days Compression Wrap: Kerlix Roll 4.5x3.1 (in/yd) (Home Health) 1 x Per Day/30 Days Discharge Instructions: Apply Kerlix and Coban compression as directed. Compression Wrap: Coban Self-Adherent Wrap 4x5 (in/yd) (Home Health) 1 x Per Day/30 Days Discharge Instructions: Apply over Kerlix as directed. Patient Medications llergies: clindamycin, codeine, gabapentin, Statins-HMG-CoA Reductase Inhibitors, sulfamethoxazole A Notifications Medication Indication Start End 11/12/2021 cephalexin DOSE 1 - oral 500 mg tablet - 1 tablet oral QID x 7 days Electronic Signature(s) Signed: 11/12/2021 3:57:21 PM By: [name] DO Entered By: [name] on 11/12/2021 15:57:21

## 2021-11-18 ENCOUNTER — Ambulatory Visit (HOSPITAL_BASED_OUTPATIENT_CLINIC_OR_DEPARTMENT_OTHER): Payer: Medicare HMO | Admitting: Internal Medicine

## 2021-11-19 ENCOUNTER — Encounter (HOSPITAL_BASED_OUTPATIENT_CLINIC_OR_DEPARTMENT_OTHER): Payer: Medicare HMO | Admitting: Internal Medicine

## 2021-11-19 DIAGNOSIS — I4891 Unspecified atrial fibrillation: Secondary | ICD-10-CM | POA: Diagnosis not present

## 2021-11-19 DIAGNOSIS — E11622 Type 2 diabetes mellitus with other skin ulcer: Secondary | ICD-10-CM

## 2021-11-19 DIAGNOSIS — I87311 Chronic venous hypertension (idiopathic) with ulcer of right lower extremity: Secondary | ICD-10-CM

## 2021-11-19 DIAGNOSIS — L97812 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d: Secondary | ICD-10-CM

## 2021-11-19 NOTE — Progress Notes (Signed)
[name] Bridges ([id number]) Visit Report for 11/19/2021 Arrival Information Details Patient Name: Date of Service: [name] Bridges, [place] [name] [name] 11/19/2021 2:30 PM Medical Record Number: [id number] Patient Account Number: [id number] Date of Birth/Sex: Treating RN: February 02, 1941 (81 y.o. [name] Bridges Primary Care [name] Bridges: [name] Bridges Other Clinician: Referring Dioselina Brumbaugh: Treating Akyia Borelli/Extender: [name] Bridges in Treatment: 4 Visit Information History Since Last Visit Added or deleted any medications: No Patient Arrived: [name] Bridges Any new allergies or adverse reactions: No Arrival Time: 14:57 Had a fall or experienced change in No Accompanied By: self activities of daily living that may affect Transfer Assistance: None risk of falls: Patient Identification Verified: Yes Signs or symptoms of abuse/neglect since last visito No Secondary Verification Process Completed: Yes Hospitalized since last visit: No Patient Requires Transmission-Based Precautions: No Implantable device outside of the clinic excluding No Patient Has Alerts: Yes cellular tissue based products placed in the center Patient Alerts: Patient on Blood Thinner since last visit: Has Dressing in Place as Prescribed: Yes Has Compression in Place as Prescribed: Yes Pain Present Now: No Electronic Signature(s) Signed: 11/19/2021 5:13:12 PM By: [name] Creamer RN, BSN Entered By: [name] Bridges on 11/19/2021 14:58:38 -------------------------------------------------------------------------------- Clinic Level of Care Assessment Details Patient Name: Date of Service: [name] Bridges, [place] [name] [name] 11/19/2021 2:30 PM Medical Record Number: [id number] Patient Account Number: [id number] Date of Birth/Sex: Treating RN: [birth year] (81 y.o. [name] Bridges Primary Care Mitsuye Schrodt: [name] Bridges Other Clinician: Referring Muzammil Bruins: Treating [name] Bridges/Extender: [name] Bridges in Treatment:  4 Clinic Level of Care Assessment Items TOOL 4 Quantity Score X- 1 0 Use when only an EandM is performed on FOLLOW-UP visit ASSESSMENTS - Nursing Assessment / Reassessment X- 1 10 Reassessment of Co-morbidities (includes updates in patient status) X- 1 5 Reassessment of Adherence to Treatment Plan ASSESSMENTS - Wound and Skin A ssessment / Reassessment X - Simple Wound Assessment / Reassessment - one wound 1 5 '[]'$  - 0 Complex Wound Assessment / Reassessment - multiple wounds '[]'$  - 0 Dermatologic / Skin Assessment (not related to wound area) ASSESSMENTS - Focused Assessment X- 1 5 Circumferential Edema Measurements - multi extremities '[]'$  - 0 Nutritional Assessment / Counseling / Intervention '[]'$  - 0 Lower Extremity Assessment (monofilament, tuning fork, pulses) '[]'$  - 0 Peripheral Arterial Disease Assessment (using hand held doppler) ASSESSMENTS - Ostomy and/or Continence Assessment and Care '[]'$  - 0 Incontinence Assessment and Management '[]'$  - 0 Ostomy Care Assessment and Management (repouching, etc.) PROCESS - Coordination of Care X - Simple Patient / Family Education for ongoing care 1 15 '[]'$  - 0 Complex (extensive) Patient / Family Education for ongoing care X- 1 10 Staff obtains Programmer, systems, Records, T Results / Process Orders est X- 1 10 Staff telephones HHA, Nursing Homes / Clarify orders / etc '[]'$  - 0 Routine Transfer to another Facility (non-emergent condition) '[]'$  - 0 Routine Hospital Admission (non-emergent condition) '[]'$  - 0 New Admissions / Biomedical engineer / Ordering NPWT Apligraf, etc. , '[]'$  - 0 Emergency Hospital Admission (emergent condition) '[]'$  - 0 Simple Discharge Coordination '[]'$  - 0 Complex (extensive) Discharge Coordination PROCESS - Special Needs '[]'$  - 0 Pediatric / Minor Patient Management '[]'$  - 0 Isolation Patient Management '[]'$  - 0 Hearing / Language / Visual special needs '[]'$  - 0 Assessment of Community assistance (transportation, D/C planning,  etc.) '[]'$  - 0 Additional assistance / Altered mentation '[]'$  - 0 Support Surface(s) Assessment (bed, cushion, seat, etc.) INTERVENTIONS - Wound Cleansing / Measurement X -  Simple Wound Cleansing - one wound 1 5 '[]'$  - 0 Complex Wound Cleansing - multiple wounds X- 1 5 Wound Imaging (photographs - any number of wounds) '[]'$  - 0 Wound Tracing (instead of photographs) X- 1 5 Simple Wound Measurement - one wound '[]'$  - 0 Complex Wound Measurement - multiple wounds INTERVENTIONS - Wound Dressings '[]'$  - 0 Small Wound Dressing one or multiple wounds '[]'$  - 0 Medium Wound Dressing one or multiple wounds X- 1 20 Large Wound Dressing one or multiple wounds X- 1 5 Application of Medications - topical '[]'$  - 0 Application of Medications - injection INTERVENTIONS - Miscellaneous '[]'$  - 0 External ear exam '[]'$  - 0 Specimen Collection (cultures, biopsies, blood, body fluids, etc.) '[]'$  - 0 Specimen(s) / Culture(s) sent or taken to Lab for analysis '[]'$  - 0 Patient Transfer (multiple staff / Civil Service fast streamer / Similar devices) '[]'$  - 0 Simple Staple / Suture removal (25 or less) '[]'$  - 0 Complex Staple / Suture removal (26 or more) '[]'$  - 0 Hypo / Hyperglycemic Management (close monitor of Blood Glucose) '[]'$  - 0 Ankle / Brachial Index (ABI) - do not check if billed separately X- 1 5 Vital Signs Has the patient been seen at the hospital within the last three years: Yes Total Score: 105 Level Of Care: New/Established - Level 3 Electronic Signature(s) Signed: 11/19/2021 5:13:12 PM By: [name] Creamer RN, BSN Entered By: [name] Bridges on 11/19/2021 16:09:23 -------------------------------------------------------------------------------- Encounter Discharge Information Details Patient Name: Date of Service: [name] Brid[place], [place] [name] [name] 11/19/2021 2:30 PM Medical Record Number: [id number] Patient Account Number: [id number] Date of Birth/Sex: Treating RN: [birth year] (82 y.o. [name] Bridges Primary Care Kaylan Friedmann: [name] Bridges Other Clinician: Referring Vedika Dumlao: Treating Anael Rosch/Extender: [name] Bridges in Treatment: 4 Encounter Discharge Information Items Discharge Condition: Stable Ambulatory Status: Walker Discharge Destination: Home Transportation: Private Auto Accompanied By: self Schedule Follow-up Appointment: Yes Clinical Summary of Care: Patient Declined Electronic Signature(s) Signed: 11/19/2021 5:13:12 PM By: [name] Creamer RN, BSN Entered By: [name] Bridges on 11/19/2021 16:10:48 -------------------------------------------------------------------------------- Lower Extremity Assessment Details Patient Name: Date of Service: [name] [place], [place] [name] [name] 11/19/2021 2:30 PM Medical Record Number: [id number] Patient Account Number: [id number] Date of Birth/Sex: Treating RN: 1941-01-10 (81 y.o. [name] Bridges Primary Care Jaquavion Mccannon: [name] Bridges Other Clinician: Referring Kimani Hovis: Treating Meshia Rau/Extender: [name] Bridges in Treatment: 4 Edema Assessment Assessed: Shirlyn Goltz: No] Patrice Paradise: No] Edema: [Left: Ye] [Right: s] Calf Left: Right: Point of Measurement: 24 cm From Medial Instep 43 cm Ankle Left: Right: Point of Measurement: 7 cm From Medial Instep 28.2 cm Vascular Assessment Pulses: Dorsalis Pedis Palpable: [Right:Yes] Electronic Signature(s) Signed: 11/19/2021 5:13:12 PM By: [name] Creamer RN, BSN Entered By: [name] Bridges on 11/19/2021 15:04:36 -------------------------------------------------------------------------------- Multi Wound Chart Details Patient Name: Date of Service: [name] Brid[place], [place] [name] [name] 11/19/2021 2:30 PM Medical Record Number: [id number] Patient Account Number: [id number] Date of Birth/Sex: Treating RN: 1940/11/30 (81 y.o. [name] Bridges Primary Care Kinan Safley: [name] Bridges Other Clinician: Referring Dynver Clemson: Treating Amora Sheehy/Extender: [name] Bridges in Treatment: 4 Vital  Signs Height(in): 40 Pulse(bpm): 90 Weight(lbs): 109 Blood Pressure(mmHg): 173/82 Body Mass Index(BMI): 54.5 Temperature(F): 98.0 Respiratory Rate(breaths/min): 20 Photos: [N/A:N/A] Right, Circumferential Lower Leg N/A N/A Wound Location: Gradually Appeared N/A N/A Wounding Event: Lymphedema N/A N/A Primary Etiology: Cataracts, Lymphedema, Arrhythmia, N/A N/A Comorbid History: Hypertension, Peripheral Venous Disease, Type II Diabetes, Osteoarthritis, Neuropathy 07/29/2021 N/A N/A Date Acquired: 4 N/A N/A Weeks of Treatment: Open N/A N/A  Wound Status: No N/A N/A Wound Recurrence: 2x2x0.1 N/A N/A Measurements L x W x D (cm) 3.142 N/A N/A A (cm) : rea 0.314 N/A N/A Volume (cm) : 98.10% N/A N/A % Reduction in Area: 99.00% N/A N/A % Reduction in Volume: Full Thickness Without Exposed N/A N/A Classification: Support Structures Large N/A N/A Exudate Amount: Serous N/A N/A Exudate Type: amber N/A N/A Exudate Color: Distinct, outline attached N/A N/A Wound Margin: Large (67-100%) N/A N/A Granulation Amount: Red, Pink N/A N/A Granulation Quality: Small (1-33%) N/A N/A Necrotic Amount: Fat Layer (Subcutaneous Tissue): Yes N/A N/A Exposed Structures: Fascia: No Tendon: No Muscle: No Joint: No Bone: No Large (67-100%) N/A N/A Epithelialization: Treatment Notes Electronic Signature(s) Signed: 11/19/2021 4:43:30 PM By: [name] DO Signed: 11/19/2021 5:13:12 PM By: [name] Creamer RN, BSN Entered By: [name] on 11/19/2021 15:27:56 -------------------------------------------------------------------------------- Multi-Disciplinary Care Plan Details Patient Name: Date of Service: [name] Brid[place], [place] [name] [name] 11/19/2021 2:30 PM Medical Record Number: [id number] Patient Account Number: [id number] Date of Birth/Sex: Treating RN: 1940/07/22 (81 y.o. [name] Bridges Primary Care Jermane Brayboy: [name] Bridges Other Clinician: Referring Laverda Stribling: Treating  Gunnison Chahal/Extender: [name] Bridges in Treatment: 4 Active Inactive Venous Leg Ulcer Nursing Diagnoses: Actual venous Insuffiency (use after diagnosis is confirmed) Goals: Patient will maintain optimal edema control Date Initiated: 10/21/2021 Target Resolution Date: 12/19/2021 Goal Status: Active Interventions: Assess peripheral edema status every visit. Compression as ordered Provide education on venous insufficiency Treatment Activities: Therapeutic compression applied : 10/21/2021 Notes: Wound/Skin Impairment Nursing Diagnoses: Impaired tissue integrity Goals: Patient/caregiver will verbalize understanding of skin care regimen Date Initiated: 10/21/2021 Target Resolution Date: 12/19/2021 Goal Status: Active Ulcer/skin breakdown will have a volume reduction of 30% by week 4 Date Initiated: 10/21/2021 Target Resolution Date: 12/19/2021 Goal Status: Active Interventions: Assess patient/caregiver ability to obtain necessary supplies Assess patient/caregiver ability to perform ulcer/skin care regimen upon admission and as needed Assess ulceration(s) every visit Provide education on ulcer and skin care Treatment Activities: Topical wound management initiated : 10/21/2021 Notes: Electronic Signature(s) Signed: 11/19/2021 5:13:12 PM By: [name] Creamer RN, BSN Entered By: [name] Bridges on 11/19/2021 16:07:45 -------------------------------------------------------------------------------- Pain Assessment Details Patient Name: Date of Service: [name] [place], [place] [name] [name] 11/19/2021 2:30 PM Medical Record Number: [id number] Patient Account Number: [id number] Date of Birth/Sex: Treating RN: 11-10-1940 (81 y.o. [name] Bridges Primary Care Kaesen Rodriguez: [name] Bridges Other Clinician: Referring Loria Lacina: Treating Jamerius Boeckman/Extender: [name] Bridges in Treatment: 4 Active Problems Location of Pain Severity and Description of Pain Patient Has  Paino No Site Locations Pain Management and Medication Current Pain Management: Electronic Signature(s) Signed: 11/19/2021 5:13:12 PM By: [name] Creamer RN, BSN Entered By: [name] Bridges on 11/19/2021 15:04:23 -------------------------------------------------------------------------------- Patient/Caregiver Education Details Patient Name: Date of Service: [name] [place] [name] [name] 9/26/2023andnbsp2:30 PM Medical Record Number: [id number] Patient Account Number: [id number] Date of Birth/Gender: Treating RN: 02/14/1941 (81 y.o. [name] Bridges Primary Care Physician: [name] Bridges Other Clinician: Referring Physician: Treating Physician/Extender: [name] Bridges in Treatment: 4 Education Assessment Education Provided To: Patient Education Topics Provided Wound/Skin Impairment: Methods: Explain/Verbal Responses: State content correctly Electronic Signature(s) Signed: 11/19/2021 5:13:12 PM By: [name] Creamer RN, BSN Entered By: [name] Bridges on 11/19/2021 16:08:00 -------------------------------------------------------------------------------- Wound Assessment Details Patient Name: Date of Service: [name] [place], [place] [name] [name] 11/19/2021 2:30 PM Medical Record Number: [id number] Patient Account Number: [id number] Date of Birth/Sex: Treating RN: 1940/05/23 (81 y.o. [name] Bridges Primary Care Leiliana Foody: [name] Bridges Other Clinician: Referring Frannie Shedrick: Treating  Makylee Sanborn/Extender: [name] Bridges in Treatment: 4 Wound Status Wound Number: 1 Primary Lymphedema Etiology: Wound Location: Right, Circumferential Lower Leg Wound Open Wounding Event: Gradually Appeared Status: Date Acquired: 07/29/2021 Comorbid Cataracts, Lymphedema, Arrhythmia, Hypertension, Peripheral Weeks Of Treatment: 4 History: Venous Disease, Type II Diabetes, Osteoarthritis, Neuropathy Clustered Wound: No Photos Wound Measurements Length: (cm) 2 Width: (cm)  2 Depth: (cm) 0.1 Area: (cm) 3.142 Volume: (cm) 0.314 % Reduction in Area: 98.1% % Reduction in Volume: 99% Epithelialization: Large (67-100%) Tunneling: No Undermining: No Wound Description Classification: Full Thickness Without Exposed Support Structures Wound Margin: Distinct, outline attached Exudate Amount: Large Exudate Type: Serous Exudate Color: amber Foul Odor After Cleansing: No Slough/Fibrino Yes Wound Bed Granulation Amount: Large (67-100%) Exposed Structure Granulation Quality: Red, Pink Fascia Exposed: No Necrotic Amount: Small (1-33%) Fat Layer (Subcutaneous Tissue) Exposed: Yes Necrotic Quality: Adherent Slough Tendon Exposed: No Muscle Exposed: No Joint Exposed: No Bone Exposed: No Treatment Notes Wound #1 (Lower Leg) Wound Laterality: Right, Circumferential Cleanser Soap and Water Discharge Instruction: May shower and wash wound with dial antibacterial soap and water prior to dressing change. Wound Cleanser Discharge Instruction: Cleanse the wound with wound cleanser prior to applying a clean dressing using gauze sponges, not tissue or cotton balls. Peri-Wound Care Zinc Oxide Ointment 30g tube Discharge Instruction: Apply Zinc Oxide to periwound with each dressing change Sween Lotion (Moisturizing lotion) Discharge Instruction: Apply moisturizing lotion as directed Topical Gentamicin Discharge Instruction: As directed by physician Primary Dressing Maxorb Extra Calcium Alginate Dressing, 4x4 in Discharge Instruction: Apply calcium alginate to wound bed as instructed Secondary Dressing ABD Pad, 8x10 Discharge Instruction: Apply over primary dressing as directed. Zetuvit Plus 4x8 in Discharge Instruction: Apply over primary dressing as directed. Secured With Compression Wrap Kerlix Roll 4.5x3.1 (in/yd) Discharge Instruction: Apply Kerlix and Coban compression as directed. Coban Self-Adherent Wrap 4x5 (in/yd) Discharge Instruction: Apply over  Kerlix as directed. Compression Stockings Add-Ons Electronic Signature(s) Signed: 11/19/2021 5:13:12 PM By: [name] Creamer RN, BSN Entered By: [name] Bridges on 11/19/2021 15:07:34 -------------------------------------------------------------------------------- [place] Details Patient Name: Date of Service: [name] Bridges, [place] [name] [name] 11/19/2021 2:30 PM Medical Record Number: [id number] Patient Account Number: [id number] Date of Birth/Sex: Treating RN: [birth year] (81 y.o. [name] Bridges Primary Care Aalyiah Camberos: [name] Bridges Other Clinician: Referring Cassey Hurrell: Treating Rolla Kedzierski/Extender: [name] Bridges in Treatment: 4 Vital Signs Time Taken: 15:00 Temperature (F): 98.0 Height (in): 62 Pulse (bpm): 69 Weight (lbs): 298 Respiratory Rate (breaths/min): 20 Body Mass Index (BMI): 54.5 Blood Pressure (mmHg): 173/82 Reference Range: 80 - 120 mg / dl Electronic Signature(s) Signed: 11/19/2021 5:13:12 PM By: [name] Creamer RN, BSN Entered By: [name] Bridges on 11/19/2021 15:01:23

## 2021-11-19 NOTE — Progress Notes (Signed)
ALVIRA, HECHT ([id number]) Visit Report for 11/19/2021 Chief Complaint Document Details Patient Name: Date of Service: [name] Bridges, [place] [name] [name] 11/19/2021 2:30 PM Medical Record Number: [id number] Patient Account Number: [id number] Date of Birth/Sex: Treating RN: Jul 09, 1940 (81 y.o. [name] Primary Care Provider: [name] Other Clinician: Referring Provider: Treating Provider/Extender: Durwin Reges in Treatment: 4 Information Obtained from: Patient Chief Complaint 10/21/2021; right lower extremity wound Electronic Signature(s) Signed: 11/19/2021 4:43:30 PM By: [name] DO Entered By: [name] on 11/19/2021 15:28:04 -------------------------------------------------------------------------------- HPI Details Patient Name: Date of Service: [name], [place] [name] [name] 11/19/2021 2:30 PM Medical Record Number: [id number] Patient Account Number: [id number] Date of Birth/Sex: Treating RN: [birth year] (81 y.o. [name] Primary Care Provider: [name] Other Clinician: Referring Provider: Treating Provider/Extender: Durwin Reges in Treatment: 4 History of Present Illness HPI Description: Admission 10/21/2021 [name] Bridges is an 81 year old female with a past medical history of morbid obesity, insulin-dependent type 2 diabetes, atrial fibrillation on Eliquis and lymphedema/venous insufficiency that presents to the clinic for a right lower extremity wound that has been present for the past 2 months. She has a history of wounds that wax and wane in healing over the past 2 years. She has been following with [facility] for this issue. They have been using Kerlix and Ace bandage along with silver alginate. She is also on torsemide 40 mg daily. She has been taking this dose for the past 5 years. She states she has a lot of drainage from the wound beds and often requires several dressing changes  throughout the day. She has home health that comes out twice weekly. She has lymphedema pumps that she uses twice daily. Currently she denies signs of infection. 9/5; patient presents for follow-up. We have been using PolyMem silver under Kerlix/Coban. She states she tolerated the compression wrap well but felt like it was very tight. Home health has been coming out however they did not have any supplies for the patient and has been using ABD pads with an Ace bandage. She was able to follow-up last week for nurse visit. She is able to make it 24 hours without drainage coming completely through the wrap/ace bandage. She denies signs of infection. 9/12; patient presents for follow-up. We have been using PolyMem silver under Kerlix/Coban. She reports less drainage from the wound bed and can last almost 2 days without a wrap change. 9/19; patient presents for follow-up. We have been using PolyMem silver under Kerlix/Coban. She tried ketoconazole and it improved her symptoms of itching however she still has significant erythema on exam. 9/26; patient presents for follow-up. We have been using PolyMem silver with ketoconazole under Kerlix/Coban. She has no issues with the wrap today. She states she has had this changed every other day by home health. I gave her cephalexin at last clinic visit however she states she broke out in hive. She immediately stopped the medication and had no further issues. She has had cephalosporins in the past without issues. Electronic Signature(s) Signed: 11/19/2021 4:43:30 PM By: [name] DO Entered By: [name] on 11/19/2021 15:29:13 -------------------------------------------------------------------------------- Physical Exam Details Patient Name: Date of Service: [name], [place] [name] [name] 11/19/2021 2:30 PM Medical Record Number: [id number] Patient Account Number: [id number] Date of Birth/Sex: Treating RN: [birth year] (81 y.o. [name] Primary Care  Provider: [name] Other Clinician: Referring Provider: Treating Provider/Extender: Durwin Reges in Treatment: 4 Constitutional  respirations regular, non-labored and within target range for patient.. Cardiovascular 2+ dorsalis pedis/posterior tibialis pulses. Psychiatric pleasant and cooperative. Notes Right lower extremity: T the posterior and medial aspect there are open wounds. T the periwound there is significant erythema with Distinct borders. Serous o o drainage noted on exam. 2+ pitting edema to the knee. Electronic Signature(s) Signed: 11/19/2021 4:43:30 PM By: [name] DO Entered By: [name] on 11/19/2021 15:29:44 -------------------------------------------------------------------------------- Physician Orders Details Patient Name: Date of Service: [name], [place] [name] [name] 11/19/2021 2:30 PM Medical Record Number: [id number] Patient Account Number: [id number] Date of Birth/Sex: Treating RN: 1940-04-06 (81 y.o. [name] Primary Care Provider: [name] Other Clinician: Referring Provider: Treating Provider/Extender: Durwin Reges in Treatment: 4 Verbal / Phone Orders: No Diagnosis Coding ICD-10 Coding Code Description [phone] 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d I87.311 Chronic venous hypertension (idiopathic) with ulcer of right lower extremity E11.622 Type 2 diabetes mellitus with other skin ulcer I48.91 Unspecified atrial fibrillation Z79.01 Long term (current) use of anticoagulants B37.2 Candidiasis of skin and nail Follow-up Appointments ppointment in 1 week. - with Dr. Heber [place] and [name], RN (Room 7) Return A Bathing/ Shower/ Hygiene May shower with protection but do not get wound dressing(s) wet. - Can shower with cast protector bag from [facility] or [place]. Edema Control - Lymphedema / SCD / Other Lymphedema Pumps. Use  Lymphedema pumps on leg(s) 2-3 times a day for 45-60 minutes. If wearing any wraps or hose, do not remove them. Continue exercising as instructed. Elevate legs to the level of the heart or above for 30 minutes daily and/or when sitting, a frequency of: - throughout the day Avoid standing for long periods of time. Additional Orders / Instructions Follow Nutritious Diet - Monitor/Control Blood Sugar Home Health New wound care orders this week; continue Home Health for wound care. May utilize formulary equivalent dressing for wound treatment orders unless otherwise specified. - use super absorptive dressing to lower right leg Dressing changes to be completed by [place] on Monday / Wednesday / Friday except when patient has scheduled visit at [place] to come 3x week Other Home Health Orders/Instructions: [name]: Fax [phone] Wound Treatment Wound #1 - Lower Leg Wound Laterality: Right, Circumferential Cleanser: Soap and Water ([place]) 1 x Per Day/30 Days Discharge Instructions: May shower and wash wound with dial antibacterial soap and water prior to dressing change. Cleanser: Wound Cleanser (Home Health) 1 x Per Day/30 Days Discharge Instructions: Cleanse the wound with wound cleanser prior to applying a clean dressing using gauze sponges, not tissue or cotton balls. Peri-Wound Care: Zinc Oxide Ointment 30g tube 1 x Per Day/30 Days Discharge Instructions: Apply Zinc Oxide to periwound with each dressing change Peri-Wound Care: Sween Lotion (Moisturizing lotion) (Home Health) 1 x Per Day/30 Days Discharge Instructions: Apply moisturizing lotion as directed Topical: Gentamicin 1 x Per Day/30 Days Discharge Instructions: As directed by physician Prim Dressing: Maxorb Extra Calcium Alginate Dressing, 4x4 in 1 x Per Day/30 Days ary Discharge Instructions: Apply calcium alginate to wound bed as instructed Secondary Dressing: ABD Pad, 8x10 ([place]) 1 x Per  Day/30 Days Discharge Instructions: Apply over primary dressing as directed. Secondary Dressing: Zetuvit Plus 4x8 in [facility]) 1 x Per Day/30 Days Discharge Instructions: Apply over primary dressing as directed. Compression Wrap: Kerlix Roll 4.5x3.1 (in/yd) (Home Health) 1 x Per Day/30 Days Discharge Instructions: Apply Kerlix and  Coban compression as directed. Compression Wrap: Coban Self-Adherent Wrap 4x5 (in/yd) (Home Health) 1 x Per Day/30 Days Discharge Instructions: Apply over Kerlix as directed. Laboratory naerobe culture (MICRO) Bacteria identified in Unspecified specimen by A LOINC Code: 601-0 Convenience Name: Anaerobic culture Electronic Signature(s) Signed: 11/19/2021 4:43:30 PM By: [name] DO Signed: 11/19/2021 5:13:12 PM By: Sharyn Creamer RN, BSN Entered By: Sharyn Creamer on 11/19/2021 16:23:13 Prescription 11/19/2021 -------------------------------------------------------------------------------- [place]ano, Leathia L. [name] DO Patient Name: Provider: Jun 26, 1940 [id number] Date of Birth: NPI#: [id number] Sex: DEA #: [phone] Phone #: License #: [place] Patient Address: [address], [place] [address] [address], [place] [address] [phone] Allergies clindamycin; codeine; gabapentin; Statins-HMG-CoA Reductase Inhibitors; sulfamethoxazole Provider's Orders Bacteria identified in Unspecified specimen by Anaerobe culture LOINC Code: 829-9 Convenience Name: Anaerobic culture Hand Signature: Date(s): Electronic Signature(s) Signed: 11/19/2021 4:43:30 PM By: [name] DO Signed: 11/19/2021 5:13:12 PM By: Sharyn Creamer RN, BSN Entered By: Sharyn Creamer on 11/19/2021 16:23:13 -------------------------------------------------------------------------------- Problem List Details Patient Name: Date of Service: [name] [place], [place] [name] [name] 11/19/2021  2:30 PM Medical Record Number: [id number] Patient Account Number: [id number] Date of Birth/Sex: Treating RN: 11-23-1940 (81 y.o. [name] Primary Care Provider: [name] Other Clinician: Referring Provider: Treating Provider/Extender: Durwin Reges in Treatment: 4 Active Problems ICD-10 Encounter Code Description Active Date MDM Diagnosis [phone] Non-pressure chronic ulcer of other part of right lower leg with fat layer 10/21/2021 No Yes exposed I87.311 Chronic venous hypertension (idiopathic) with ulcer of right lower extremity 10/21/2021 No Yes E11.622 Type 2 diabetes mellitus with other skin ulcer 10/21/2021 No Yes I48.91 Unspecified atrial fibrillation 10/21/2021 No Yes Z79.01 Long term (current) use of anticoagulants 10/21/2021 No Yes B37.2 Candidiasis of skin and nail 11/05/2021 No Yes Inactive Problems Resolved Problems Electronic Signature(s) Signed: 11/19/2021 4:43:30 PM By: [name] DO Signed: 11/19/2021 4:43:30 PM By: [name] DO Entered By: [name] on 11/19/2021 15:27:50 -------------------------------------------------------------------------------- Progress Note Details Patient Name: Date of Service: [name], [place] [name] [name] 11/19/2021 2:30 PM Medical Record Number: [id number] Patient Account Number: [id number] Date of Birth/Sex: Treating RN: 09-23-1940 (81 y.o. [name] Primary Care Provider: [name] Other Clinician: Referring Provider: Treating Provider/Extender: Durwin Reges in Treatment: 4 Subjective Chief Complaint Information obtained from Patient 10/21/2021; right lower extremity wound History of Present Illness (HPI) Admission 10/21/2021 Ms. Nichele Slawson is an 81 year old female with a past medical history of morbid obesity, insulin-dependent type 2 diabetes, atrial fibrillation on Eliquis and lymphedema/venous insufficiency that presents to the clinic for  a right lower extremity wound that has been present for the past 2 months. She has a history of wounds that wax and wane in healing over the past 2 years. She has been following with [facility] for this issue. They have been using Kerlix and Ace bandage along with silver alginate. She is also on torsemide 40 mg daily. She has been taking this dose for the past 5 years. She states she has a lot of drainage from the wound beds and often requires several dressing changes throughout the day. She has home health that comes out twice weekly. She has lymphedema pumps that she uses twice daily. Currently she denies signs of infection. 9/5; patient presents for follow-up. We have been using PolyMem silver under Kerlix/Coban. She states she tolerated the compression wrap well but felt like it was very  tight. Home health has been coming out however they did not have any supplies for the patient and has been using ABD pads with an Ace bandage. She was able to follow-up last week for nurse visit. She is able to make it 24 hours without drainage coming completely through the wrap/ace bandage. She denies signs of infection. 9/12; patient presents for follow-up. We have been using PolyMem silver under Kerlix/Coban. She reports less drainage from the wound bed and can last almost 2 days without a wrap change. 9/19; patient presents for follow-up. We have been using PolyMem silver under Kerlix/Coban. She tried ketoconazole and it improved her symptoms of itching however she still has significant erythema on exam. 9/26; patient presents for follow-up. We have been using PolyMem silver with ketoconazole under Kerlix/Coban. She has no issues with the wrap today. She states she has had this changed every other day by home health. I gave her cephalexin at last clinic visit however she states she broke out in hive. She immediately stopped the medication and had no further issues. She has had cephalosporins in  the past without issues. Patient History Information obtained from Patient. Family History Diabetes - Siblings,Mother, Heart Disease - Mother, Hypertension - Mother, Kidney Disease - Mother, Stroke - Father. Social History Never smoker, Marital Status - Married, Alcohol Use - Never, Drug Use - No History, Caffeine Use - Rarely. Medical History Eyes Patient has history of Cataracts Hematologic/Lymphatic Patient has history of Lymphedema Cardiovascular Patient has history of Arrhythmia - A fib, Hypertension, Peripheral Venous Disease Endocrine Patient has history of Type II Diabetes Musculoskeletal Patient has history of Osteoarthritis Neurologic Patient has history of Neuropathy Medical A Surgical History Notes nd Genitourinary CKD Objective Constitutional respirations regular, non-labored and within target range for patient.. Vitals Time Taken: 3:00 PM, Height: 62 in, Weight: 298 lbs, BMI: 54.5, Temperature: 98.0 F, Pulse: 69 bpm, Respiratory Rate: 20 breaths/min, Blood Pressure: 173/82 mmHg. Cardiovascular 2+ dorsalis pedis/posterior tibialis pulses. Psychiatric pleasant and cooperative. General Notes: Right lower extremity: T the posterior and medial aspect there are open wounds. T the periwound there is significant erythema with Distinct o o borders. Serous drainage noted on exam. 2+ pitting edema to the knee. Integumentary (Hair, Skin) Wound #1 status is Open. Original cause of wound was Gradually Appeared. The date acquired was: 07/29/2021. The wound has been in treatment 4 weeks. The wound is located on the Right,Circumferential Lower Leg. The wound measures 2cm length x 2cm width x 0.1cm depth; 3.142cm^2 area and 0.314cm^3 volume. There is Fat Layer (Subcutaneous Tissue) exposed. There is no tunneling or undermining noted. There is a large amount of serous drainage noted. The wound margin is distinct with the outline attached to the wound base. There is large (67-100%)  red, pink granulation within the wound bed. There is a small (1-33%) amount of necrotic tissue within the wound bed including Adherent Slough. Assessment Active Problems ICD-10 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d Chronic venous hypertension (idiopathic) with ulcer of right lower extremity Type 2 diabetes mellitus with other skin ulcer Unspecified atrial fibrillation Long term (current) use of anticoagulants Candidiasis of skin and nail Patient's wound is stable. The periwound is still excoriated with erythema. She did not tolerate the oral antibiotics. Ketoconazole does not seem to help. I recommend at this time a PCR culture as she may benefit from [facility] antibiotic spray. She does not tolerate a compression wrap higher than Kerlix/Coban. At this time I  recommended switching the dressing to calcium alginate adding gentamicin ointment and continuing Kerlix/Coban. Plan Follow-up Appointments: Return Appointment in 1 week. - with Dr. Heber [place] and [name], RN (Room 7) Bathing/ Shower/ Hygiene: May shower with protection but do not get wound dressing(s) wet. - Can shower with cast protector bag from [facility], [facility] or [place]. Edema Control - Lymphedema / SCD / Other: Lymphedema Pumps. Use Lymphedema pumps on leg(s) 2-3 times a day for 45-60 minutes. If wearing any wraps or hose, do not remove them. Continue exercising as instructed. Elevate legs to the level of the heart or above for 30 minutes daily and/or when sitting, a frequency of: - throughout the day Avoid standing for long periods of time. Additional Orders / Instructions: Follow Nutritious Diet - Monitor/Control Blood Sugar Home Health: New wound care orders this week; continue Home Health for wound care. May utilize formulary equivalent dressing for wound treatment orders unless otherwise specified. - use super absorptive dressing to lower right leg Dressing changes to be completed by  [place] on Monday / Wednesday / Friday except when patient has scheduled visit at [place] to come 3x week Other Home Health Orders/Instructions: [name]: Fax [phone] WOUND #1: - Lower Leg Wound Laterality: Right, Circumferential Cleanser: Soap and Water ([place]) 1 x Per Day/30 Days Discharge Instructions: May shower and wash wound with dial antibacterial soap and water prior to dressing change. Cleanser: Wound Cleanser (Home Health) 1 x Per Day/30 Days Discharge Instructions: Cleanse the wound with wound cleanser prior to applying a clean dressing using gauze sponges, not tissue or cotton balls. Peri-Wound Care: Zinc Oxide Ointment 30g tube 1 x Per Day/30 Days Discharge Instructions: Apply Zinc Oxide to periwound with each dressing change Peri-Wound Care: Sween Lotion (Moisturizing lotion) (Home Health) 1 x Per Day/30 Days Discharge Instructions: Apply moisturizing lotion as directed Topical: Gentamicin 1 x Per Day/30 Days Discharge Instructions: As directed by physician Prim Dressing: Maxorb Extra Calcium Alginate Dressing, 4x4 in 1 x Per Day/30 Days ary Discharge Instructions: Apply calcium alginate to wound bed as instructed Secondary Dressing: ABD Pad, 8x10 ([place]) 1 x Per Day/30 Days Discharge Instructions: Apply over primary dressing as directed. Secondary Dressing: Zetuvit Plus 4x8 in [facility]) 1 x Per Day/30 Days Discharge Instructions: Apply over primary dressing as directed. Com pression Wrap: Kerlix Roll 4.5x3.1 (in/yd) (Home Health) 1 x Per Day/30 Days Discharge Instructions: Apply Kerlix and Coban compression as directed. Compression Wrap: Coban Self-Adherent Wrap 4x5 (in/yd) (Home Health) 1 x Per Day/30 Days Discharge Instructions: Apply over Kerlix as directed. 1. Calcium alginate with gentamicin under Kerlix/Coban 2. PCR culture[facility] antibiotic spray 3. Follow-up in 1 week Electronic Signature(s) Signed: 11/19/2021  4:43:30 PM By: [name] DO Entered By: [name] on 11/19/2021 15:31:25 -------------------------------------------------------------------------------- HxROS Details Patient Name: Date of Service: [name], [place] [name] [name] 11/19/2021 2:30 PM Medical Record Number: [id number] Patient Account Number: [id number] Date of Birth/Sex: Treating RN: Aug 06, 1940 (81 y.o. [name] Primary Care Provider: [name] Other Clinician: Referring Provider: Treating Provider/Extender: Durwin Reges in Treatment: 4 Information Obtained From Patient Eyes Medical History: Positive for: Cataracts Hematologic/Lymphatic Medical History: Positive for: Lymphedema Cardiovascular Medical History: Positive for: Arrhythmia - A fib; Hypertension; Peripheral Venous Disease Endocrine Medical History: Positive for: Type II Diabetes Time with diabetes: 30 years Treated with: Insulin Blood sugar tested every day: Yes Tested : 3x day Genitourinary Medical History: Past Medical History Notes: CKD Musculoskeletal Medical History:  Positive for: Osteoarthritis Neurologic Medical History: Positive for: Neuropathy HBO Extended History Items Eyes: Cataracts Immunizations Pneumococcal Vaccine: Received Pneumococcal Vaccination: Yes Received Pneumococcal Vaccination On or After 60th Birthday: Yes Implantable Devices None Family and Social History Diabetes: Yes - Siblings,Mother; Heart Disease: Yes - Mother; Hypertension: Yes - Mother; Kidney Disease: Yes - Mother; Stroke: Yes - Father; Never smoker; Marital Status - Married; Alcohol Use: Never; Drug Use: No History; Caffeine Use: Rarely; Financial Concerns: No; Food, Clothing or Shelter Needs: No; Support System Lacking: No; Transportation Concerns: No Electronic Signature(s) Signed: 11/19/2021 4:43:30 PM By: [name] DO Signed: 11/19/2021 5:13:12 PM By: Sharyn Creamer RN, BSN Entered By: [name] on 11/19/2021 15:29:19 -------------------------------------------------------------------------------- [place] Details Patient Name: Date of Service: [name], [place] [name] [name] 11/19/2021 Medical Record Number: [id number] Patient Account Number: [id number] Date of Birth/Sex: Treating RN: 01-24-1941 (81 y.o. [name] Primary Care Provider: [name] Other Clinician: Referring Provider: Treating Provider/Extender: Durwin Reges in Treatment: 4 Diagnosis Coding ICD-10 Codes Code Description [phone] 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d I87.311 Chronic venous hypertension (idiopathic) with ulcer of right lower extremity E11.622 Type 2 diabetes mellitus with other skin ulcer I48.91 Unspecified atrial fibrillation Z79.01 Long term (current) use of anticoagulants B37.2 Candidiasis of skin and nail Facility Procedures CPT4 Code: [id number] Description: [place] VISIT-LEV 3 EST PT Modifier: 25 Quantity: 1 Physician Procedures : CPT4 Code Description Modifier [id number] - WC PHYS LEVEL 3 - EST PT ICD-10 Diagnosis Description J28.786 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d I87.311 Chronic venous hypertension (idiopathic) with ulcer  of right lower extremity E11.622 Type 2 diabetes mellitus with other skin ulcer I48.91 Unspecified atrial fibrillation Quantity: 1 Electronic Signature(s) Signed: 11/19/2021 4:43:30 PM By: [name] DO Signed: 11/19/2021 5:13:12 PM By: Sharyn Creamer RN, BSN Entered By: Sharyn Creamer on 11/19/2021 16:09:40

## 2021-11-20 NOTE — Progress Notes (Signed)
JODELLE, FAUSTO ([id number]) Visit Report for 11/12/2021 Arrival Information Details Patient Name: Date of Service: [name] Bridges, [place] [name] [name] 11/12/2021 3:30 PM Medical Record Number: [id number] Patient Account Number: [id number] Date of Birth/Sex: Treating RN: Oct 05, 1940 (81 y.o. [name] Primary Care Layali Freund: [name] Other Clinician: Referring Rudolf Blizard: Treating Jahaziel Francois/Extender: Durwin Reges in Treatment: 3 Visit Information History Since Last Visit Added or deleted any medications: No Patient Arrived: [name] Any new allergies or adverse reactions: No Arrival Time: 15:30 Had a fall or experienced change in No Accompanied By: Self activities of daily living that may affect Transfer Assistance: None risk of falls: Patient Requires Transmission-Based Precautions: No Signs or symptoms of abuse/neglect since last visito No Patient Has Alerts: Yes Hospitalized since last visit: No Patient Alerts: Patient on Blood Thinner Implantable device outside of the clinic excluding No cellular tissue based products placed in the center since last visit: Has Dressing in Place as Prescribed: Yes Has Compression in Place as Prescribed: Yes Pain Present Now: No Electronic Signature(s) Signed: 11/12/2021 5:02:41 PM By: Sharyn Creamer RN, BSN Entered By: Sharyn Creamer on 11/12/2021 15:31:09 -------------------------------------------------------------------------------- Clinic Level of Care Assessment Details Patient Name: Date of Service: [name], [place] [name] [name] 11/12/2021 3:30 PM Medical Record Number: [id number] Patient Account Number: [id number] Date of Birth/Sex: Treating RN: [birth year] (81 y.o. [name] Primary Care Oneika Simonian: [name] Other Clinician: Referring Banks Chaikin: Treating Flor Houdeshell/Extender: Durwin Reges in Treatment: 3 Clinic Level of Care Assessment Items TOOL 4 Quantity Score X- 1 0 Use when  only an EandM is performed on FOLLOW-UP visit ASSESSMENTS - Nursing Assessment / Reassessment X- 1 10 Reassessment of Co-morbidities (includes updates in patient status) X- 1 5 Reassessment of Adherence to Treatment Plan ASSESSMENTS - Wound and Skin A ssessment / Reassessment X - Simple Wound Assessment / Reassessment - one wound 1 5 '[]'$  - 0 Complex Wound Assessment / Reassessment - multiple wounds '[]'$  - 0 Dermatologic / Skin Assessment (not related to wound area) ASSESSMENTS - Focused Assessment '[]'$  - 0 Circumferential Edema Measurements - multi extremities '[]'$  - 0 Nutritional Assessment / Counseling / Intervention '[]'$  - 0 Lower Extremity Assessment (monofilament, tuning fork, pulses) '[]'$  - 0 Peripheral Arterial Disease Assessment (using hand held doppler) ASSESSMENTS - Ostomy and/or Continence Assessment and Care '[]'$  - 0 Incontinence Assessment and Management '[]'$  - 0 Ostomy Care Assessment and Management (repouching, etc.) PROCESS - Coordination of Care X - Simple Patient / Family Education for ongoing care 1 15 '[]'$  - 0 Complex (extensive) Patient / Family Education for ongoing care X- 1 10 Staff obtains Programmer, systems, Records, T Results / Process Orders est X- 1 10 Staff telephones HHA, Nursing Homes / Clarify orders / etc '[]'$  - 0 Routine Transfer to another Facility (non-emergent condition) '[]'$  - 0 Routine Hospital Admission (non-emergent condition) '[]'$  - 0 New Admissions / Biomedical engineer / Ordering NPWT Apligraf, etc. , '[]'$  - 0 Emergency Hospital Admission (emergent condition) '[]'$  - 0 Simple Discharge Coordination '[]'$  - 0 Complex (extensive) Discharge Coordination PROCESS - Special Needs '[]'$  - 0 Pediatric / Minor Patient Management '[]'$  - 0 Isolation Patient Management '[]'$  - 0 Hearing / Language / Visual special needs '[]'$  - 0 Assessment of Community assistance (transportation, D/C planning, etc.) '[]'$  - 0 Additional assistance / Altered mentation '[]'$  - 0 Support  Surface(s) Assessment (bed, cushion, seat, etc.) INTERVENTIONS - Wound Cleansing / Measurement X - Simple Wound Cleansing - one wound 1 5 '[]'$  -  0 Complex Wound Cleansing - multiple wounds X- 1 5 Wound Imaging (photographs - any number of wounds) '[]'$  - 0 Wound Tracing (instead of photographs) X- 1 5 Simple Wound Measurement - one wound '[]'$  - 0 Complex Wound Measurement - multiple wounds INTERVENTIONS - Wound Dressings '[]'$  - 0 Small Wound Dressing one or multiple wounds '[]'$  - 0 Medium Wound Dressing one or multiple wounds X- 1 20 Large Wound Dressing one or multiple wounds '[]'$  - 0 Application of Medications - topical '[]'$  - 0 Application of Medications - injection INTERVENTIONS - Miscellaneous '[]'$  - 0 External ear exam '[]'$  - 0 Specimen Collection (cultures, biopsies, blood, body fluids, etc.) '[]'$  - 0 Specimen(s) / Culture(s) sent or taken to Lab for analysis '[]'$  - 0 Patient Transfer (multiple staff / Civil Service fast streamer / Similar devices) '[]'$  - 0 Simple Staple / Suture removal (25 or less) '[]'$  - 0 Complex Staple / Suture removal (26 or more) '[]'$  - 0 Hypo / Hyperglycemic Management (close monitor of Blood Glucose) '[]'$  - 0 Ankle / Brachial Index (ABI) - do not check if billed separately X- 1 5 Vital Signs Has the patient been seen at the hospital within the last three years: Yes Total Score: 95 Level Of Care: New/Established - Level 3 Electronic Signature(s) Signed: 11/19/2021 5:13:12 PM By: Sharyn Creamer RN, BSN Entered By: Sharyn Creamer on 11/19/2021 17:06:26 -------------------------------------------------------------------------------- Encounter Discharge Information Details Patient Name: Date of Service: [name] Bridges, [place] [name] [name] 11/12/2021 3:30 PM Medical Record Number: [id number] Patient Account Number: [id number] Date of Birth/Sex: Treating RN: 11-Oct-1940 (81 y.o. [name] Primary Care Carley Strickling: [name] Other Clinician: Referring Braxley Balandran: Treating Thamar Holik/Extender:  Durwin Reges in Treatment: 3 Encounter Discharge Information Items Discharge Condition: Stable Ambulatory Status: Walker Discharge Destination: Home Transportation: Private Auto Accompanied By: self Schedule Follow-up Appointment: Yes Clinical Summary of Care: Patient Declined Electronic Signature(s) Signed: 11/12/2021 5:02:41 PM By: Sharyn Creamer RN, BSN Entered By: Sharyn Creamer on 11/12/2021 16:19:48 -------------------------------------------------------------------------------- Lower Extremity Assessment Details Patient Name: Date of Service: [name], [place] [name] [name] 11/12/2021 3:30 PM Medical Record Number: [id number] Patient Account Number: [id number] Date of Birth/Sex: Treating RN: 17-Aug-1940 (81 y.o. [name] Primary Care Lezley Bedgood: [name] Other Clinician: Referring Chrishaun Sasso: Treating Moraima Burd/Extender: Durwin Reges in Treatment: 3 Edema Assessment Assessed: Shirlyn Goltz: No] Patrice Paradise: No] Edema: [Left: Ye] [Right: s] Calf Left: Right: Point of Measurement: 24 cm From Medial Instep 42 cm Ankle Left: Right: Point of Measurement: 7 cm From Medial Instep 27 cm Vascular Assessment Pulses: Dorsalis Pedis Palpable: [Right:Yes] Electronic Signature(s) Signed: 11/12/2021 5:02:41 PM By: Sharyn Creamer RN, BSN Entered By: Sharyn Creamer on 11/12/2021 15:38:07 -------------------------------------------------------------------------------- Multi Wound Chart Details Patient Name: Date of Service: [name] Brid[place], [place] [name] [name] 11/12/2021 3:30 PM Medical Record Number: [id number] Patient Account Number: [id number] Date of Birth/Sex: Treating RN: 06-12-1940 (81 y.o. [name] Primary Care Simmie Garin: [name] Other Clinician: Referring Brenten Janney: Treating Delmar Dondero/Extender: Durwin Reges in Treatment: 3 Vital Signs Height(in): 65 Capillary Blood Glucose(mg/dl): 124 Weight(lbs):  298 Pulse(bpm): 60 Body Mass Index(BMI): 54.5 Blood Pressure(mmHg): 188/71 Temperature(F): 97.7 Respiratory Rate(breaths/min): 20 Photos: [N/A:N/A] Right, Circumferential Lower Leg N/A N/A Wound Location: Gradually Appeared N/A N/A Wounding Event: Lymphedema N/A N/A Primary Etiology: Cataracts, Lymphedema, Arrhythmia, N/A N/A Comorbid History: Hypertension, Peripheral Venous Disease, Type II Diabetes, Osteoarthritis, Neuropathy 07/29/2021 N/A N/A Date Acquired: 3 N/A N/A Weeks of Treatment: Open N/A N/A Wound Status: No N/A N/A Wound  Recurrence: 2x2x0.1 N/A N/A Measurements L x W x D (cm) 3.142 N/A N/A A (cm) : rea 0.314 N/A N/A Volume (cm) : 98.10% N/A N/A % Reduction in Area: 99.00% N/A N/A % Reduction in Volume: Full Thickness Without Exposed N/A N/A Classification: Support Structures Large N/A N/A Exudate Amount: Serous N/A N/A Exudate Type: amber N/A N/A Exudate Color: Distinct, outline attached N/A N/A Wound Margin: Large (67-100%) N/A N/A Granulation Amount: Red, Pink N/A N/A Granulation Quality: Small (1-33%) N/A N/A Necrotic Amount: Fat Layer (Subcutaneous Tissue): Yes N/A N/A Exposed Structures: Fascia: No Tendon: No Muscle: No Joint: No Bone: No Large (67-100%) N/A N/A Epithelialization: Treatment Notes Electronic Signature(s) Signed: 11/12/2021 4:16:01 PM By: [name] DO Signed: 11/20/2021 4:59:35 PM By: Lorrin Jackson Entered By: [name] on 11/12/2021 16:10:28 -------------------------------------------------------------------------------- Multi-Disciplinary Care Plan Details Patient Name: Date of Service: [name], [place] [name] [name] 11/12/2021 3:30 PM Medical Record Number: [id number] Patient Account Number: [id number] Date of Birth/Sex: Treating RN: 1940/03/02 (81 y.o. [name] Primary Care Kaena Santori: [name] Other Clinician: Referring Tyneisha Hegeman: Treating Edris Friedt/Extender: Durwin Reges in Treatment: 3 Active Inactive Venous Leg Ulcer Nursing Diagnoses: Actual venous Insuffiency (use after diagnosis is confirmed) Goals: Patient will maintain optimal edema control Date Initiated: 10/21/2021 Target Resolution Date: 11/11/2021 Goal Status: Active Interventions: Assess peripheral edema status every visit. Compression as ordered Provide education on venous insufficiency Treatment Activities: Therapeutic compression applied : 10/21/2021 Notes: Wound/Skin Impairment Nursing Diagnoses: Impaired tissue integrity Goals: Patient/caregiver will verbalize understanding of skin care regimen Date Initiated: 10/21/2021 Target Resolution Date: 11/11/2021 Goal Status: Active Ulcer/skin breakdown will have a volume reduction of 30% by week 4 Date Initiated: 10/21/2021 Target Resolution Date: 11/11/2021 Goal Status: Active Interventions: Assess patient/caregiver ability to obtain necessary supplies Assess patient/caregiver ability to perform ulcer/skin care regimen upon admission and as needed Assess ulceration(s) every visit Provide education on ulcer and skin care Treatment Activities: Topical wound management initiated : 10/21/2021 Notes: Electronic Signature(s) Signed: 11/12/2021 5:02:41 PM By: Sharyn Creamer RN, BSN Entered By: Sharyn Creamer on 11/12/2021 15:43:27 -------------------------------------------------------------------------------- Pain Assessment Details Patient Name: Date of Service: [name] [place], [place] [name] [name] 11/12/2021 3:30 PM Medical Record Number: [id number] Patient Account Number: [id number] Date of Birth/Sex: Treating RN: 11/09/1940 (81 y.o. [name] Primary Care Kamarian Sahakian: [name] Other Clinician: Referring Areta Terwilliger: Treating Dauntae Derusha/Extender: Durwin Reges in Treatment: 3 Active Problems Location of Pain Severity and Description of Pain Patient Has Paino No Site Locations Pain Management and  Medication Current Pain Management: Electronic Signature(s) Signed: 11/12/2021 5:02:41 PM By: Sharyn Creamer RN, BSN Entered By: Sharyn Creamer on 11/12/2021 15:33:01 -------------------------------------------------------------------------------- Patient/Caregiver Education Details Patient Name: Date of Service: [name] [place], [place] [name] [name] 9/19/2023andnbsp3:30 PM Medical Record Number: [id number] Patient Account Number: [id number] Date of Birth/Gender: Treating RN: 03/28/1940 (81 y.o. [name] Primary Care Physician: [name] Other Clinician: Referring Physician: Treating Physician/Extender: Durwin Reges in Treatment: 3 Education Assessment Education Provided To: Patient Education Topics Provided Venous: Methods: Explain/Verbal Responses: State content correctly Wound/Skin Impairment: Methods: Explain/Verbal Responses: State content correctly Electronic Signature(s) Signed: 11/12/2021 5:02:41 PM By: Sharyn Creamer RN, BSN Entered By: Sharyn Creamer on 11/12/2021 15:43:46 -------------------------------------------------------------------------------- Wound Assessment Details Patient Name: Date of Service: [name], [place] [name] [name] 11/12/2021 3:30 PM Medical Record Number: [id number] Patient Account Number: [id number] Date of Birth/Sex: Treating RN: 1940-06-06 (81 y.o. [name] Primary Care Khamani Fairley: [name] Other Clinician: Referring Lanson Randle: Treating Cosby Proby/Extender:  Ernesta Amble Weeks in Treatment: 3 Wound Status Wound Number: 1 Primary Lymphedema Etiology: Wound Location: Right, Circumferential Lower Leg Wound Open Wounding Event: Gradually Appeared Status: Date Acquired: 07/29/2021 Comorbid Cataracts, Lymphedema, Arrhythmia, Hypertension, Peripheral Weeks Of Treatment: 3 History: Venous Disease, Type II Diabetes, Osteoarthritis, Neuropathy Clustered Wound: No Photos Wound  Measurements Length: (cm) 2 Width: (cm) 2 Depth: (cm) 0.1 Area: (cm) 3.142 Volume: (cm) 0.314 % Reduction in Area: 98.1% % Reduction in Volume: 99% Epithelialization: Large (67-100%) Tunneling: No Undermining: No Wound Description Classification: Full Thickness Without Exposed Support Structures Wound Margin: Distinct, outline attached Exudate Amount: Large Exudate Type: Serous Exudate Color: amber Foul Odor After Cleansing: No Slough/Fibrino Yes Wound Bed Granulation Amount: Large (67-100%) Exposed Structure Granulation Quality: Red, Pink Fascia Exposed: No Necrotic Amount: Small (1-33%) Fat Layer (Subcutaneous Tissue) Exposed: Yes Necrotic Quality: Adherent Slough Tendon Exposed: No Muscle Exposed: No Joint Exposed: No Bone Exposed: No Electronic Signature(s) Signed: 11/12/2021 5:02:41 PM By: Sharyn Creamer RN, BSN Entered By: Sharyn Creamer on 11/12/2021 15:39:29 -------------------------------------------------------------------------------- Vitals Details Patient Name: Date of Service: [name] [place], [place] [name] [name] 11/12/2021 3:30 PM Medical Record Number: [id number] Patient Account Number: [id number] Date of Birth/Sex: Treating RN: 03-03-1940 (81 y.o. [name] Primary Care Froilan Mclean: [name] Other Clinician: Referring Lyana Asbill: Treating Avary Pitsenbarger/Extender: Durwin Reges in Treatment: 3 Vital Signs Time Taken: 15:32 Temperature (F): 97.7 Height (in): 62 Pulse (bpm): 72 Weight (lbs): 298 Respiratory Rate (breaths/min): 20 Body Mass Index (BMI): 54.5 Blood Pressure (mmHg): 188/71 Capillary Blood Glucose (mg/dl): 124 Reference Range: 80 - 120 mg / dl Electronic Signature(s) Signed: 11/12/2021 5:02:41 PM By: Sharyn Creamer RN, BSN Entered By: Sharyn Creamer on 11/12/2021 15:32:45

## 2021-11-21 ENCOUNTER — Encounter (HOSPITAL_BASED_OUTPATIENT_CLINIC_OR_DEPARTMENT_OTHER): Payer: Medicare HMO | Admitting: Internal Medicine

## 2021-11-26 ENCOUNTER — Encounter (HOSPITAL_BASED_OUTPATIENT_CLINIC_OR_DEPARTMENT_OTHER): Payer: Medicare HMO | Attending: Internal Medicine | Admitting: Internal Medicine

## 2021-11-26 DIAGNOSIS — E1122 Type 2 diabetes mellitus with diabetic chronic kidney disease: Secondary | ICD-10-CM | POA: Insufficient documentation

## 2021-11-26 DIAGNOSIS — E1140 Type 2 diabetes mellitus with diabetic neuropathy, unspecified: Secondary | ICD-10-CM | POA: Insufficient documentation

## 2021-11-26 DIAGNOSIS — E1151 Type 2 diabetes mellitus with diabetic peripheral angiopathy without gangrene: Secondary | ICD-10-CM | POA: Insufficient documentation

## 2021-11-26 DIAGNOSIS — L97812 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d: Secondary | ICD-10-CM | POA: Diagnosis not present

## 2021-11-26 DIAGNOSIS — I4891 Unspecified atrial fibrillation: Secondary | ICD-10-CM

## 2021-11-26 DIAGNOSIS — I129 Hypertensive chronic kidney disease with stage 1 through stage 4 chronic kidney disease, or unspecified chronic kidney disease: Secondary | ICD-10-CM | POA: Diagnosis not present

## 2021-11-26 DIAGNOSIS — N189 Chronic kidney disease, unspecified: Secondary | ICD-10-CM | POA: Insufficient documentation

## 2021-11-26 DIAGNOSIS — I872 Venous insufficiency (chronic) (peripheral): Secondary | ICD-10-CM | POA: Diagnosis not present

## 2021-11-26 DIAGNOSIS — E11622 Type 2 diabetes mellitus with other skin ulcer: Secondary | ICD-10-CM | POA: Insufficient documentation

## 2021-11-26 DIAGNOSIS — I890 Lymphedema, not elsewhere classified: Secondary | ICD-10-CM | POA: Insufficient documentation

## 2021-11-26 DIAGNOSIS — B372 Candidiasis of skin and nail: Secondary | ICD-10-CM | POA: Insufficient documentation

## 2021-11-26 DIAGNOSIS — I87311 Chronic venous hypertension (idiopathic) with ulcer of right lower extremity: Secondary | ICD-10-CM | POA: Insufficient documentation

## 2021-11-26 NOTE — Progress Notes (Signed)
JHENE, WESTMORELAND ([id number]) Visit Report for 11/26/2021 Arrival Information Details Patient Name: Date of Service: [name] Bridges 11/26/2021 3:30 PM Medical Record Number: [id number] Patient Account Number: [id number] Date of Birth/Sex: Treating RN: 03-03-1940 (81 y.o. F) Primary Care [name] Bridges: [name] Bridges Other Clinician: Referring [name] Bridges: Treating [name] Bridges/Extender: [name] Bridges in Treatment: 5 Visit Information History Since Last Visit Added or deleted any medications: No Patient Arrived: [name] Bridges Any new allergies or adverse reactions: No Arrival Time: 15:26 Had a fall or experienced change in No Accompanied By: Husband activities of daily living that may affect Transfer Assistance: None risk of falls: Patient Identification Verified: Yes Signs or symptoms of abuse/neglect since last visito No Secondary Verification Process Completed: Yes Hospitalized since last visit: No Patient Requires Transmission-Based Precautions: No Implantable device outside of the clinic excluding No Patient Has Alerts: Yes cellular tissue based products placed in the center Patient Alerts: Patient on Blood Thinner since last visit: Has Dressing in Place as Prescribed: Yes Pain Present Now: No Electronic Signature(s) Signed: 11/26/2021 4:28:27 PM By: [name] Bridges Entered By: [name] Bridges on 11/26/2021 15:31:49 -------------------------------------------------------------------------------- Clinic Level of Care Assessment Details Patient Name: Date of ServiceJamie Bridges [name] [name] 11/26/2021 3:30 PM Medical Record Number: [id number] Patient Account Number: [id number] Date of Birth/Sex: Treating RN: 1940/09/12 (81 y.o. [name] Bridges Primary Care [name] Bridges: [name] Bridges Other Clinician: Referring [name] Bridges: Treating [name] Bridges/Extender: [name] Bridges in Treatment: 5 Clinic Level of Care Assessment Items TOOL 4 Quantity  Score X- 1 0 Use when only an EandM is performed on FOLLOW-UP visit ASSESSMENTS - Nursing Assessment / Reassessment X- 1 10 Reassessment of Co-morbidities (includes updates in patient status) X- 1 5 Reassessment of Adherence to Treatment Plan ASSESSMENTS - Wound and Skin A ssessment / Reassessment X - Simple Wound Assessment / Reassessment - one wound 1 5 '[]'$  - 0 Complex Wound Assessment / Reassessment - multiple wounds '[]'$  - 0 Dermatologic / Skin Assessment (not related to wound area) ASSESSMENTS - Focused Assessment '[]'$  - 0 Circumferential Edema Measurements - multi extremities '[]'$  - 0 Nutritional Assessment / Counseling / Intervention '[]'$  - 0 Lower Extremity Assessment (monofilament, tuning fork, pulses) '[]'$  - 0 Peripheral Arterial Disease Assessment (using hand held doppler) ASSESSMENTS - Ostomy and/or Continence Assessment and Care '[]'$  - 0 Incontinence Assessment and Management '[]'$  - 0 Ostomy Care Assessment and Management (repouching, etc.) PROCESS - Coordination of Care '[]'$  - 0 Simple Patient / Family Education for ongoing care X- 1 20 Complex (extensive) Patient / Family Education for ongoing care '[]'$  - 0 Staff obtains Programmer, systems, Records, T Results / Process Orders est X- 1 10 Staff telephones HHA, Nursing Homes / Clarify orders / etc '[]'$  - 0 Routine Transfer to another Facility (non-emergent condition) '[]'$  - 0 Routine Hospital Admission (non-emergent condition) '[]'$  - 0 New Admissions / Biomedical engineer / Ordering NPWT Apligraf, etc. , '[]'$  - 0 Emergency Hospital Admission (emergent condition) '[]'$  - 0 Simple Discharge Coordination '[]'$  - 0 Complex (extensive) Discharge Coordination PROCESS - Special Needs '[]'$  - 0 Pediatric / Minor Patient Management '[]'$  - 0 Isolation Patient Management '[]'$  - 0 Hearing / Language / Visual special needs '[]'$  - 0 Assessment of Community assistance (transportation, D/C planning, etc.) '[]'$  - 0 Additional assistance / Altered  mentation '[]'$  - 0 Support Surface(s) Assessment (bed, cushion, seat, etc.) INTERVENTIONS - Wound Cleansing / Measurement X - Simple Wound Cleansing - one wound 1 5 '[]'$  - 0 Complex  Wound Cleansing - multiple wounds X- 1 5 Wound Imaging (photographs - any number of wounds) '[]'$  - 0 Wound Tracing (instead of photographs) X- 1 5 Simple Wound Measurement - one wound '[]'$  - 0 Complex Wound Measurement - multiple wounds INTERVENTIONS - Wound Dressings '[]'$  - 0 Small Wound Dressing one or multiple wounds '[]'$  - 0 Medium Wound Dressing one or multiple wounds X- 1 20 Large Wound Dressing one or multiple wounds '[]'$  - 0 Application of Medications - topical '[]'$  - 0 Application of Medications - injection INTERVENTIONS - Miscellaneous '[]'$  - 0 External ear exam '[]'$  - 0 Specimen Collection (cultures, biopsies, blood, body fluids, etc.) '[]'$  - 0 Specimen(s) / Culture(s) sent or taken to Lab for analysis '[]'$  - 0 Patient Transfer (multiple staff / Civil Service fast streamer / Similar devices) '[]'$  - 0 Simple Staple / Suture removal (25 or less) '[]'$  - 0 Complex Staple / Suture removal (26 or more) '[]'$  - 0 Hypo / Hyperglycemic Management (close monitor of Blood Glucose) '[]'$  - 0 Ankle / Brachial Index (ABI) - do not check if billed separately X- 1 5 Vital Signs Has the patient been seen at the hospital within the last three years: Yes Total Score: 90 Level Of Care: New/Established - Level 3 Electronic Signature(s) Signed: 11/26/2021 4:26:27 PM By: [name] Bridges Entered By: [name] Bridges on 11/26/2021 16:04:43 -------------------------------------------------------------------------------- Encounter Discharge Information Details Patient Name: Date of Service: [name] Bridges, [place] [name] [name] 11/26/2021 3:30 PM Medical Record Number: [id number] Patient Account Number: [id number] Date of Birth/Sex: Treating RN: 08-28-1940 (81 y.o. [name] Bridges Primary Care [name] Bridges: [name] Bridges Other Clinician: Referring [name] Bridges: Treating  [name] Bridges/Extender: [name] Bridges in Treatment: 5 Encounter Discharge Information Items Discharge Condition: Stable Ambulatory Status: Walker Discharge Destination: Home Transportation: Private Auto Schedule Follow-up Appointment: Yes Clinical Summary of Care: Provided on 11/26/2021 Form Type Recipient Paper Patient Patient Electronic Signature(s) Signed: 11/26/2021 4:26:27 PM By: [name] Bridges Entered By: [name] Bridges on 11/26/2021 16:22:11 -------------------------------------------------------------------------------- Lower Extremity Assessment Details Patient Name: Date of Service: [name] Bridges, [place] [name] [name] 11/26/2021 3:30 PM Medical Record Number: [id number] Patient Account Number: [id number] Date of Birth/Sex: Treating RN: 23-May-1940 (81 y.o. [name] Bridges Primary Care Julia Kulzer: [name] Bridges Other Clinician: Referring Aluel Schwarz: Treating Kristopher Attwood/Extender: [name] Bridges in Treatment: 5 Edema Assessment Assessed: Shirlyn Goltz: No] Patrice Paradise: Yes] Edema: [Left: Ye] [Right: s] Calf Left: Right: Point of Measurement: 24 cm From Medial Instep 43 cm Ankle Left: Right: Point of Measurement: 7 cm From Medial Instep 27.5 cm Vascular Assessment Pulses: Dorsalis Pedis Palpable: [Right:Yes] Electronic Signature(s) Signed: 11/26/2021 4:26:27 PM By: [name] Bridges Entered By: [name] Bridges on 11/26/2021 15:40:49 -------------------------------------------------------------------------------- Multi-Disciplinary Care Plan Details Patient Name: Date of Service: [name] Bridges, [place] [name] [name] 11/26/2021 3:30 PM Medical Record Number: [id number] Patient Account Number: [id number] Date of Birth/Sex: Treating RN: 1940-06-12 (81 y.o. [name] Bridges Primary Care Ragna Kramlich: [name] Bridges Other Clinician: Referring Tameria Patti: Treating Derius Ghosh/Extender: [name] Bridges in Treatment: 5 Active Inactive Venous Leg  Ulcer Nursing Diagnoses: Actual venous Insuffiency (use after diagnosis is confirmed) Goals: Patient will maintain optimal edema control Date Initiated: 10/21/2021 Target Resolution Date: 12/19/2021 Goal Status: Active Interventions: Assess peripheral edema status every visit. Compression as ordered Provide education on venous insufficiency Treatment Activities: Therapeutic compression applied : 10/21/2021 Notes: Wound/Skin Impairment Nursing Diagnoses: Impaired tissue integrity Goals: Patient/caregiver will verbalize understanding of skin care regimen Date Initiated: 10/21/2021 Target Resolution Date: 12/19/2021 Goal Status: Active Ulcer/skin breakdown will have  a volume reduction of 30% by week 4 Date Initiated: 10/21/2021 Target Resolution Date: 12/19/2021 Goal Status: Active Interventions: Assess patient/caregiver ability to obtain necessary supplies Assess patient/caregiver ability to perform ulcer/skin care regimen upon admission and as needed Assess ulceration(s) every visit Provide education on ulcer and skin care Treatment Activities: Topical wound management initiated : 10/21/2021 Notes: Electronic Signature(s) Signed: 11/26/2021 4:26:27 PM By: [name] Bridges Entered By: [name] Bridges on 11/26/2021 15:40:11 -------------------------------------------------------------------------------- Pain Assessment Details Patient Name: Date of Service: [name] Bridges, [place] [name] [name] 11/26/2021 3:30 PM Medical Record Number: [id number] Patient Account Number: [id number] Date of Birth/Sex: Treating RN: 1940/11/29 (81 y.o. F) Primary Care Cynthya Yam: [name] Bridges Other Clinician: Referring Enisa Runyan: Treating Commodore Bellew/Extender: [name] Bridges in Treatment: 5 Active Problems Location of Pain Severity and Description of Pain Patient Has Paino No Site Locations Pain Management and Medication Current Pain Management: Electronic Signature(s) Signed: 11/26/2021  4:28:27 PM By: [name] Bridges Entered By: [name] Bridges on 11/26/2021 15:35:45 -------------------------------------------------------------------------------- Patient/Caregiver Education Details Patient Name: Date of Service: [name], [place] [name] [name] 10/3/2023andnbsp3:30 PM Medical Record Number: [id number] Patient Account Number: [id number] Date of Birth/Gender: Treating RN: [birth year] (81 y.o. [name] Bridges Primary Care Physician: [name] Bridges Other Clinician: Referring Physician: Treating Physician/Extender: [name] Bridges in Treatment: 5 Education Assessment Education Provided To: Patient Education Topics Provided Venous: Methods: Explain/Verbal, Printed Responses: State content correctly Wound/Skin Impairment: Methods: Explain/Verbal, Printed Responses: State content correctly Electronic Signature(s) Signed: 11/26/2021 4:26:27 PM By: [name] Bridges Entered By: [name] Bridges on 11/26/2021 15:40:30 -------------------------------------------------------------------------------- Wound Assessment Details Patient Name: Date of Service: [name] Bridges, [place] [name] [name] 11/26/2021 3:30 PM Medical Record Number: [id number] Patient Account Number: [id number] Date of Birth/Sex: Treating RN: April 10, 1940 (81 y.o. [name] Bridges Primary Care Rayven Hendrickson: [name] Bridges Other Clinician: Referring Parnika Tweten: Treating Therman Hughlett/Extender: [name] Bridges in Treatment: 5 Wound Status Wound Number: 1 Primary Lymphedema Etiology: Wound Location: Right, Circumferential Lower Leg Wound Open Wounding Event: Gradually Appeared Status: Date Acquired: 07/29/2021 Comorbid Cataracts, Lymphedema, Arrhythmia, Hypertension, Peripheral Weeks Of Treatment: 5 History: Venous Disease, Type II Diabetes, Osteoarthritis, Neuropathy Clustered Wound: No Photos Wound Measurements Length: (cm) 2 Width: (cm) 2 Depth: (cm) 0.1 Area: (cm) 3.142 Volume: (cm)  0.314 % Reduction in Area: 98.1% % Reduction in Volume: 99% Epithelialization: Large (67-100%) Tunneling: No Undermining: No Wound Description Classification: Full Thickness Without Exposed Support Structures Wound Margin: Distinct, outline attached Exudate Amount: Medium Exudate Type: Serous Exudate Color: amber Foul Odor After Cleansing: No Slough/Fibrino No Wound Bed Granulation Amount: Large (67-100%) Exposed Structure Granulation Quality: Red, Pink Fascia Exposed: No Necrotic Amount: None Present (0%) Fat Layer (Subcutaneous Tissue) Exposed: Yes Tendon Exposed: No Muscle Exposed: No Joint Exposed: No Bone Exposed: No Periwound Skin Texture Texture Color No Abnormalities Noted: Yes No Abnormalities Noted: No Atrophie Blanche: No Moisture Cyanosis: No No Abnormalities Noted: Yes Ecchymosis: No Erythema: Yes Erythema Location: Circumferential Hemosiderin Staining: No Mottled: No Pallor: No Rubor: No Temperature / Pain Temperature: No Abnormality Treatment Notes Wound #1 (Lower Leg) Wound Laterality: Right, Circumferential Cleanser Soap and Water Discharge Instruction: May shower and wash wound with dial antibacterial soap and water prior to dressing change. Wound Cleanser Discharge Instruction: Cleanse the wound with wound cleanser prior to applying a clean dressing using gauze sponges, not tissue or cotton balls. Peri-Wound Care Zinc Oxide Ointment 30g tube Discharge Instruction: Apply Zinc Oxide to periwound with each dressing change Sween Lotion (Moisturizing lotion) Discharge Instruction: Apply moisturizing lotion as directed  Topical Compounded Antibiotic Discharge Instruction: Apply to reddened areas Primary Dressing KerraCel Ag Gelling Fiber Dressing, 4x5 in (silver alginate) Discharge Instruction: Apply silver alginate to wound bed as instructed Secondary Dressing ABD Pad, 8x10 Discharge Instruction: Apply over primary dressing as  directed. Zetuvit Plus 4x8 in Discharge Instruction: Apply over primary dressing as directed. Secured With Compression Wrap Kerlix Roll 4.5x3.1 (in/yd) Discharge Instruction: Apply Kerlix and Coban compression as directed. Coban Self-Adherent Wrap 4x5 (in/yd) Discharge Instruction: Apply over Kerlix as directed. Compression Stockings Add-Ons Electronic Signature(s) Signed: 11/26/2021 4:26:27 PM By: [name] Bridges Signed: 11/26/2021 4:28:27 PM By: [name] Bridges Entered By: [name] Bridges on 11/26/2021 15:48:54 -------------------------------------------------------------------------------- Vitals Details Patient Name: Date of Service: [name] Bridges, [place] [name] [name] 11/26/2021 3:30 PM Medical Record Number: [id number] Patient Account Number: [id number] Date of Birth/Sex: Treating RN: 1940/07/29 (81 y.o. F) Primary Care Meyer Arora: [name] Bridges Other Clinician: Referring Tarrin Lebow: Treating Robet Crutchfield/Extender: [name] Bridges in Treatment: 5 Vital Signs Time Taken: 15:35 Temperature (F): 98.3 Height (in): 62 Pulse (bpm): 64 Weight (lbs): 298 Respiratory Rate (breaths/min): 20 Body Mass Index (BMI): 54.5 Blood Pressure (mmHg): 138/67 Capillary Blood Glucose (mg/dl): 134 Reference Range: 80 - 120 mg / dl Electronic Signature(s) Signed: 11/26/2021 4:28:27 PM By: [name] Bridges Entered By: [name] Bridges on 11/26/2021 15:35:37

## 2021-11-26 NOTE — Progress Notes (Signed)
[name], Bridges ([id number]) Visit Report for 11/26/2021 HPI Details Patient Name: Date of Service: [name] Bridges 11/26/2021 3:30 PM Medical Record Number: [id number] Patient Account Number: [id number] Date of Birth/Sex: Treating RN: [birth year] (81 y.o. F) Primary Care Provider: [name] Other Clinician: Referring Provider: Treating Provider/Extender: Durwin Reges in Treatment: 5 History of Present Illness HPI Description: Admission 10/21/2021 Ms. [name] Bridges is an 81 year old female with a past medical history of morbid obesity, insulin-dependent type 2 diabetes, atrial fibrillation on Eliquis and lymphedema/venous insufficiency that presents to the clinic for a right lower extremity wound that has been present for the past 2 months. She has a history of wounds that wax and wane in healing over the past 2 years. She has been following with [facility] for this issue. They have been using Kerlix and Ace bandage along with silver alginate. She is also on torsemide 40 mg daily. She has been taking this dose for the past 5 years. She states she has a lot of drainage from the wound beds and often requires several dressing changes throughout the day. She has home health that comes out twice weekly. She has lymphedema pumps that she uses twice daily. Currently she denies signs of infection. 9/5; patient presents for follow-up. We have been using PolyMem silver under Kerlix/Coban. She states she tolerated the compression wrap well but felt like it was very tight. Home health has been coming out however they did not have any supplies for the patient and has been using ABD pads with an Ace bandage. She was able to follow-up last week for nurse visit. She is able to make it 24 hours without drainage coming completely through the wrap/ace bandage. She denies signs of infection. 9/12; patient presents for follow-up. We have been using PolyMem silver  under Kerlix/Coban. She reports less drainage from the wound bed and can last almost 2 days without a wrap change. 9/19; patient presents for follow-up. We have been using PolyMem silver under Kerlix/Coban. She tried ketoconazole and it improved her symptoms of itching however she still has significant erythema on exam. 9/26; patient presents for follow-up. We have been using PolyMem silver with ketoconazole under Kerlix/Coban. She has no issues with the wrap today. She states she has had this changed every other day by home health. I gave her cephalexin at last clinic visit however she states she broke out in hive. She immediately stopped the medication and had no further issues. She has had cephalosporins in the past without issues. 10/3; patient presents for follow-up. She had a wound culture done at last clinic visit that showed extra high levels of Serratia marcescens, Pseudomonas aeruginosa, Morganella morganii I, Enterococcus bacillus Electronic Signature(s) Unsigned Entered By: [name] on 11/26/2021 16:44:26 -------------------------------------------------------------------------------- Physician Orders Details Patient Name: Date of Service: [name] Bridges, [place] RO [name] L. 11/26/2021 3:30 PM Medical Record Number: [id number] Patient Account Number: [id number] Date of Birth/Sex: Treating RN: [birth year] (81 y.o. [name] Primary Care Provider: [name] Other Clinician: Referring Provider: Treating Provider/Extender: Durwin Reges in Treatment: 5 Verbal / Phone Orders: No Diagnosis Coding Follow-up Appointments ppointment in 1 week. - with Dr. Heber [place] and [name], RN (Room 7) Return A Anesthetic (In clinic) Topical Lidocaine 5% applied to wound bed (In clinic) Topical Lidocaine 4% applied to wound bed Bathing/ Shower/ Hygiene May shower with protection but do not get wound dressing(s) wet. - Can shower with cast protector bag from [facility] or [employer]. Edema Control - Lymphedema / SCD / Other Lymphedema Pumps. Use Lymphedema pumps on leg(s) 2-3 times a day for 45-60 minutes. If wearing any wraps or hose, do not remove them. Continue exercising as instructed. Elevate legs to the level of the heart or above for 30 minutes daily and/or when sitting, a frequency of: - throughout the day Avoid standing for long periods of time. Additional Orders / Instructions Follow Nutritious Diet - Monitor/Control Blood Sugar Home Health New wound care orders this week; continue Home Health for wound care. May utilize formulary equivalent dressing for wound treatment orders unless otherwise specified. - Primary dressing changed to Silver Alginate. Compounded antibiotic ordered from [facility]. Begin to apply once obtained. Mix per pharmacy instructions and apply to all reddened area then put Silver Alginate over it. Dressing changes to be completed by [facility] on Monday / Wednesday / Friday except when patient has scheduled visit at [facility] to come 3x week Other Home Health Orders/Instructions: [name]: Fax [phone] Wound Treatment Wound #1 - Lower Leg Wound Laterality: Right, Circumferential Cleanser: Soap and Water ([place]) 1 x Per Day/30 Days Discharge Instructions: May shower and wash wound with dial antibacterial soap and water prior to dressing change. Cleanser: Wound Cleanser (Home Health) 1 x Per Day/30 Days Discharge Instructions: Cleanse the wound with wound cleanser prior to applying a clean dressing using gauze sponges, not tissue or cotton balls. Peri-Wound Care: Zinc Oxide Ointment 30g tube (Home Health) 1 x Per Day/30 Days Discharge Instructions: Apply Zinc Oxide to periwound with each dressing change Peri-Wound Care: Sween Lotion (Moisturizing lotion) (Home Health) 1 x Per Day/30 Days Discharge Instructions: Apply moisturizing lotion as directed Topical: Compounded Antibiotic 1 x  Per Day/30 Days Discharge Instructions: Apply to reddened areas Prim Dressing: KerraCel Ag Gelling Fiber Dressing, 4x5 in (silver alginate) (Home Health) 1 x Per Day/30 Days ary Discharge Instructions: Apply silver alginate to wound bed as instructed Secondary Dressing: ABD Pad, 8x10 (Home Health) 1 x Per Day/30 Days Discharge Instructions: Apply over primary dressing as directed. Secondary Dressing: Zetuvit Plus 4x8 in [facility]) 1 x Per Day/30 Days Discharge Instructions: Apply over primary dressing as directed. Compression Wrap: Kerlix Roll 4.5x3.1 (in/yd) (Home Health) 1 x Per Day/30 Days Discharge Instructions: Apply Kerlix and Coban compression as directed. Compression Wrap: Coban Self-Adherent Wrap 4x5 (in/yd) (Home Health) 1 x Per Day/30 Days Discharge Instructions: Apply over Kerlix as directed. Electronic Signature(s) Signed: 11/26/2021 4:26:27 PM By: Lorrin Jackson Signed: 11/26/2021 4:41:45 PM By: [name] DO Entered By: Lorrin Jackson on 11/26/2021 16:03:31 -------------------------------------------------------------------------------- SuperBill Details Patient Name: Date of Service: [name] [name], [place] RO [name] L. 11/26/2021 Medical Record Number: [id number] Patient Account Number: [id number] Date of Birth/Sex: Treating RN: 1941/01/08 (81 y.o. [name] Primary Care Provider: [name] Other Clinician: Referring Provider: Treating Provider/Extender: Durwin Reges in Treatment: 5 Diagnosis Coding ICD-10 Codes Code Description [phone] 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d I87.311 Chronic venous hypertension (idiopathic) with ulcer of right lower extremity E11.622 Type 2 diabetes mellitus with other skin ulcer I48.91 Unspecified atrial fibrillation Z79.01 Long term (current) use of anticoagulants B37.2 Candidiasis of skin and nail Facility Procedures CPT4 Code: 41324401 Description:  [place] VISIT-LEV 3 EST PT Modifier: Quantity: 1 Electronic Signature(s) Signed: 11/26/2021 4:26:27 PM By: Lorrin Jackson Signed: 11/26/2021 4:41:45 PM By: [name] DO Entered By: Lorrin Jackson on 11/26/2021  16:04:52 

## 2021-12-03 ENCOUNTER — Encounter (HOSPITAL_BASED_OUTPATIENT_CLINIC_OR_DEPARTMENT_OTHER): Payer: Medicare HMO | Admitting: Internal Medicine

## 2021-12-03 DIAGNOSIS — L97812 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d: Secondary | ICD-10-CM

## 2021-12-03 DIAGNOSIS — E11622 Type 2 diabetes mellitus with other skin ulcer: Secondary | ICD-10-CM | POA: Diagnosis not present

## 2021-12-03 DIAGNOSIS — I4891 Unspecified atrial fibrillation: Secondary | ICD-10-CM | POA: Diagnosis not present

## 2021-12-03 DIAGNOSIS — I87311 Chronic venous hypertension (idiopathic) with ulcer of right lower extremity: Secondary | ICD-10-CM

## 2021-12-03 NOTE — Progress Notes (Signed)
[name] Bridges, [name] Bridges ([id number]) [id number] Page 1 of 8 Visit Report for 12/03/2021 Chief Complaint Document Details Patient Name: Date of Service: [name] Bridges, [place] RO [name] L. 12/03/2021 10:15 A M Medical Record Number: [id number] Patient Account Number: [id number] Date of Birth/Sex: Treating RN: 1941/01/29 (81 y.o. [name] Bridges Primary Care Provider: [name] Other Clinician: Referring Provider: Treating Provider/Extender: Durwin Reges in Treatment: 6 Information Obtained from: Patient Chief Complaint 10/21/2021; right lower extremity wound Electronic Signature(s) Signed: 12/03/2021 11:37:20 AM By: [name] DO Entered By: [name] on 12/03/2021 11:00:28 -------------------------------------------------------------------------------- HPI Details Patient Name: Date of Service: [name], [place] RO [name] L. 12/03/2021 10:15 A M Medical Record Number: [id number] Patient Account Number: [id number] Date of Birth/Sex: Treating RN: 01/31/1941 (81 y.o. [name] Bridges Primary Care Provider: [name] Other Clinician: Referring Provider: Treating Provider/Extender: Durwin Reges in Treatment: 6 History of Present Illness HPI Description: Admission 10/21/2021 Ms. [name] Bridges is an 81 year old female with a past medical history of morbid obesity, insulin-dependent type 2 diabetes, atrial fibrillation on Eliquis and lymphedema/venous insufficiency that presents to the clinic for a right lower extremity wound that has been present for the past 2 months. She has a history of wounds that wax and wane in healing over the past 2 years. She has been following with [facility] for this issue. They have been using Kerlix and Ace bandage along with silver alginate. She is also on torsemide 40 mg daily. She has been taking this dose for the past 5 years. She states she has a lot of drainage  from the wound beds and often requires several dressing changes throughout the day. She has home health that comes out twice weekly. She has lymphedema pumps that she uses twice daily. Currently she denies signs of infection. 9/5; patient presents for follow-up. We have been using PolyMem silver under Kerlix/Coban. She states she tolerated the compression wrap well but felt like it was very tight. Home health has been coming out however they did not have any supplies for the patient and has been using ABD pads with an Ace bandage. She was able to follow-up last week for nurse visit. She is able to make it 24 hours without drainage coming completely through the wrap/ace bandage. She denies signs of infection. 9/12; patient presents for follow-up. We have been using PolyMem silver under Kerlix/Coban. She reports less drainage from the wound bed and can last almost 2 days without a wrap change. 9/19; patient presents for follow-up. We have been using PolyMem silver under Kerlix/Coban. She tried ketoconazole and it improved her symptoms of itching however she still has significant erythema on exam. 9/26; patient presents for follow-up. We have been using PolyMem silver with ketoconazole under Kerlix/Coban. She has no issues with the wrap today. She states she has had this changed every other day by home health. I gave her cephalexin at last clinic visit however she states she broke out in hive. She immediately stopped the medication and had no further issues. She has had cephalosporins in the past without issues. 10/3; patient presents for follow-up. She had a wound culture done at last clinic visit that showed extra high levels of Serratia marcescens, Pseudomonas aeruginosa, Morganella morganii I, Enterococcus faecalis. Keystone antibiotic was ordered. Patient has not been contacted for purchase yet. We have been using calcium alginate under Kerlix/Coban as she does not tolerate a higher compression  wrap. 10/10; patient presents for follow-up. She has been  using Keystone antibiotics and has done this for the past 3 dressing changes. She reports improvement in drainage and a decrease in rednessaround the periwound. We have also been using silver alginate under Kerlix/Coban. [name], [name] ([id number]) [id number] Page 2 of 8 Electronic Signature(s) Signed: 12/03/2021 11:37:20 AM By: [name] DO Entered By: [name] on 12/03/2021 11:02:10 -------------------------------------------------------------------------------- Physical Exam Details Patient Name: Date of Service: [name], [place] RO [name] L. 12/03/2021 10:15 A M Medical Record Number: [id number] Patient Account Number: [id number] Date of Birth/Sex: Treating RN: 06/29/1940 (81 y.o. [name] Bridges Primary Care Provider: [name] Other Clinician: Referring Provider: Treating Provider/Extender: Durwin Reges in Treatment: 6 Constitutional respirations regular, non-labored and within target range for patient.. Cardiovascular 2+ dorsalis pedis/posterior tibialis pulses. Psychiatric pleasant and cooperative. Notes Right lower extremity: T the posterior medial aspect there are open wounds limited to skin breakdown. Improvement in erythema to the periwound. No purulent o drainage. 2+ pitting edema to the knee. Electronic Signature(s) Signed: 12/03/2021 11:37:20 AM By: [name] DO Entered By: [name] on 12/03/2021 11:02:44 -------------------------------------------------------------------------------- Physician Orders Details Patient Name: Date of Service: [name], [place] RO [name] L. 12/03/2021 10:15 A M Medical Record Number: [id number] Patient Account Number: [id number] Date of Birth/Sex: Treating RN: Oct 13, 1940 (81 y.o. [name] Bridges Primary Care Provider: [name] Other Clinician: Referring Provider: Treating Provider/Extender:  Durwin Reges in Treatment: 6 Verbal / Phone Orders: No Diagnosis Coding Follow-up Appointments ppointment in 1 week. - with Dr. Heber Combs and [name], RN (Room 7) Return A Anesthetic (In clinic) Topical Lidocaine 5% applied to wound bed (In clinic) Topical Lidocaine 4% applied to wound bed Bathing/ Shower/ Hygiene May shower with protection but do not get wound dressing(s) wet. - Can shower with cast protector bag from [facility], [facility] or [place]. Edema Control - Lymphedema / SCD / Other Lymphedema Pumps. Use Lymphedema pumps on leg(s) 2-3 times a day for 45-60 minutes. If wearing any wraps or hose, do not remove them. Continue exercising as instructed. Elevate legs to the level of the heart or above for 30 minutes daily and/or when sitting, a frequency of: - throughout the day Avoid standing for long periods of time. Additional Orders / Instructions AICIA, BABINSKI ([id number]) [id number] Page 3 of 8 Follow Nutritious Diet - Monitor/Control Blood Sugar Home Health No change in wound care orders this week; continue Home Health for wound care. May utilize formulary equivalent dressing for wound treatment orders unless otherwise specified. Dressing changes to be completed by [place] on Monday / Wednesday / Friday except when patient has scheduled visit at [place] to come 3x week Other Home Health Orders/Instructions: [name]: Fax [phone] Wound Treatment Wound #1 - Lower Leg Wound Laterality: Right, Circumferential Cleanser: Soap and Water ([place]) 1 x Per Day/30 Days Discharge Instructions: May shower and wash wound with dial antibacterial soap and water prior to dressing change. Cleanser: Wound Cleanser (Home Health) 1 x Per Day/30 Days Discharge Instructions: Cleanse the wound with wound cleanser prior to applying a clean dressing using gauze sponges, not tissue or cotton balls. Peri-Wound Care: Zinc  Oxide Ointment 30g tube (Home Health) 1 x Per Day/30 Days Discharge Instructions: Apply Zinc Oxide to periwound with each dressing change Peri-Wound Care: Sween Lotion (Moisturizing lotion) (Home Health) 1 x Per Day/30 Days Discharge Instructions: Apply moisturizing lotion as directed Topical: Compounded Antibiotic 1 x Per Day/30 Days Discharge Instructions: Apply to reddened areas Prim  Dressing: KerraCel Ag Gelling Fiber Dressing, 4x5 in (silver alginate) (Home Health) 1 x Per Day/30 Days ary Discharge Instructions: Apply silver alginate to wound bed as instructed Secondary Dressing: ABD Pad, 8x10 (Home Health) 1 x Per Day/30 Days Discharge Instructions: Apply over primary dressing as directed. Secondary Dressing: Zetuvit Plus 4x8 in [facility]) 1 x Per Day/30 Days Discharge Instructions: Apply over primary dressing as directed. Compression Wrap: Kerlix Roll 4.5x3.1 (in/yd) (Home Health) 1 x Per Day/30 Days Discharge Instructions: Apply Kerlix and Coban compression as directed. Compression Wrap: Coban Self-Adherent Wrap 4x5 (in/yd) (Home Health) 1 x Per Day/30 Days Discharge Instructions: Apply over Kerlix as directed. Electronic Signature(s) Signed: 12/03/2021 11:37:20 AM By: [name] DO Entered By: [name] on 12/03/2021 11:02:51 -------------------------------------------------------------------------------- Problem List Details Patient Name: Date of Service: [name], [place] RO [name] L. 12/03/2021 10:15 A M Medical Record Number: [id number] Patient Account Number: [id number] Date of Birth/Sex: Treating RN: 14-Mar-1940 (81 y.o. [name] Bridges Primary Care Provider: [name] Other Clinician: Referring Provider: Treating Provider/Extender: Durwin Reges in Treatment: 6 Active Problems ICD-10 Encounter Code Description Active Date MDM Diagnosis [id number] Non-pressure chronic ulcer of other part of right lower leg with fat layer 10/21/2021  No Yes exposed I87.311 Chronic venous hypertension (idiopathic) with ulcer of right lower extremity 10/21/2021 No Yes Cabriales, Nada L ([id number]) [id number] Page 4 of 8 E11.622 Type 2 diabetes mellitus with other skin ulcer 10/21/2021 No Yes I48.91 Unspecified atrial fibrillation 10/21/2021 No Yes Z79.01 Long term (current) use of anticoagulants 10/21/2021 No Yes B37.2 Candidiasis of skin and nail 11/05/2021 No Yes Inactive Problems Resolved Problems Electronic Signature(s) Signed: 12/03/2021 11:37:20 AM By: [name] DO Entered By: [name] on 12/03/2021 11:00:13 -------------------------------------------------------------------------------- Progress Note Details Patient Name: Date of Service: [name], [place] RO [name] L. 12/03/2021 10:15 A M Medical Record Number: [id number] Patient Account Number: [id number] Date of Birth/Sex: Treating RN: [birth year] (81 y.o. [name] Bridges Primary Care Provider: [name] Other Clinician: Referring Provider: Treating Provider/Extender: Durwin Reges in Treatment: 6 Subjective Chief Complaint Information obtained from Patient 10/21/2021; right lower extremity wound History of Present Illness (HPI) Admission 10/21/2021 Ms. Tabithia Stroder is an 81 year old female with a past medical history of morbid obesity, insulin-dependent type 2 diabetes, atrial fibrillation on Eliquis and lymphedema/venous insufficiency that presents to the clinic for a right lower extremity wound that has been present for the past 2 months. She has a history of wounds that wax and wane in healing over the past 2 years. She has been following with [facility] for this issue. They have been using Kerlix and Ace bandage along with silver alginate. She is also on torsemide 40 mg daily. She has been taking this dose for the past 5 years. She states she has a lot of drainage from the wound beds and  often requires several dressing changes throughout the day. She has home health that comes out twice weekly. She has lymphedema pumps that she uses twice daily. Currently she denies signs of infection. 9/5; patient presents for follow-up. We have been using PolyMem silver under Kerlix/Coban. She states she tolerated the compression wrap well but felt like it was very tight. Home health has been coming out however they did not have any supplies for the patient and has been using ABD pads with an Ace bandage. She was able to follow-up last week for nurse visit. She is able to make it 24 hours without drainage coming  completely through the wrap/ace bandage. She denies signs of infection. 9/12; patient presents for follow-up. We have been using PolyMem silver under Kerlix/Coban. She reports less drainage from the wound bed and can last almost 2 days without a wrap change. 9/19; patient presents for follow-up. We have been using PolyMem silver under Kerlix/Coban. She tried ketoconazole and it improved her symptoms of itching however she still has significant erythema on exam. 9/26; patient presents for follow-up. We have been using PolyMem silver with ketoconazole under Kerlix/Coban. She has no issues with the wrap today. She states she has had this changed every other day by home health. I gave her cephalexin at last clinic visit however she states she broke out in hive. She immediately stopped the medication and had no further issues. She has had cephalosporins in the past without issues. 10/3; patient presents for follow-up. She had a wound culture done at last clinic visit that showed extra high levels of Serratia marcescens, Pseudomonas aeruginosa, Morganella morganii I, Enterococcus faecalis. Keystone antibiotic was ordered. Patient has not been contacted for purchase yet. We have been using calcium alginate under Kerlix/Coban as she does not tolerate a higher compression wrap. 10/10; patient  presents for follow-up. She has been using Keystone antibiotics and has done this for the past 3 dressing changes. She reports improvement in drainage and a decrease in rednessaround the periwound. We have also been using silver alginate under Kerlix/Coban. ZAIDA, REILAND ([id number]) [id number] Page 5 of 8 Patient History Information obtained from Patient. Family History Diabetes - Siblings,Mother, Heart Disease - Mother, Hypertension - Mother, Kidney Disease - Mother, Stroke - Father. Social History Never smoker, Marital Status - Married, Alcohol Use - Never, Drug Use - No History, Caffeine Use - Rarely. Medical History Eyes Patient has history of Cataracts Hematologic/Lymphatic Patient has history of Lymphedema Cardiovascular Patient has history of Arrhythmia - A fib, Hypertension, Peripheral Venous Disease Endocrine Patient has history of Type II Diabetes Musculoskeletal Patient has history of Osteoarthritis Neurologic Patient has history of Neuropathy Medical A Surgical History Notes nd Genitourinary CKD Objective Constitutional respirations regular, non-labored and within target range for patient.. Vitals Time Taken: 10:37 AM, Height: 62 in, Weight: 298 lbs, BMI: 54.5, Temperature: 98.9 F, Pulse: 64 bpm, Respiratory Rate: 22 breaths/min, Blood Pressure: 146/76 mmHg, Capillary Blood Glucose: 134 mg/dl. Cardiovascular 2+ dorsalis pedis/posterior tibialis pulses. Psychiatric pleasant and cooperative. General Notes: Right lower extremity: T the posterior medial aspect there are open wounds limited to skin breakdown. Improvement in erythema to the o periwound. No purulent drainage. 2+ pitting edema to the knee. Integumentary (Hair, Skin) Wound #1 status is Open. Original cause of wound was Gradually Appeared. The date acquired was: 07/29/2021. The wound has been in treatment 6 weeks. The wound is located on the Right,Circumferential Lower Leg.  The wound measures 1cm length x 1cm width x 0.1cm depth; 0.785cm^2 area and 0.079cm^3 volume. There is Fat Layer (Subcutaneous Tissue) exposed. There is no tunneling or undermining noted. There is a medium amount of serous drainage noted. The wound margin is distinct with the outline attached to the wound base. There is large (67-100%) red, pink granulation within the wound bed. There is no necrotic tissue within the wound bed. The periwound skin appearance had no abnormalities noted for texture. The periwound skin appearance had no abnormalities noted for moisture. The periwound skin appearance did not exhibit: Atrophie Blanche, Cyanosis, Ecchymosis, Hemosiderin Staining, Mottled, Pallor, Rubor, Erythema. Periwound temperature was noted as No Abnormality. Assessment Active Problems ICD-10  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d Chronic venous hypertension (idiopathic) with ulcer of right lower extremity Type 2 diabetes mellitus with other skin ulcer Unspecified atrial fibrillation Long term (current) use of anticoagulants Candidiasis of skin and nail Patient's symptoms have improved with the use of Keystone antibiotic ointment. There is less erythema. She still has open areas limited to skin breakdown. I recommended continuing the [facility] antibiotic along with silver alginate under compression therapy. Follow-up in 1 week. ANGELIK, WALLS ([id number]) [id number] Page 6 of 8 Plan Follow-up Appointments: Return Appointment in 1 week. - with Dr. Heber McIntosh and [name], RN (Room 7) Anesthetic: (In clinic) Topical Lidocaine 5% applied to wound bed (In clinic) Topical Lidocaine 4% applied to wound bed Bathing/ Shower/ Hygiene: May shower with protection but do not get wound dressing(s) wet. - Can shower with cast protector bag from [facility], [facility] or [place]. Edema Control - Lymphedema / SCD / Other: Lymphedema Pumps. Use  Lymphedema pumps on leg(s) 2-3 times a day for 45-60 minutes. If wearing any wraps or hose, do not remove them. Continue exercising as instructed. Elevate legs to the level of the heart or above for 30 minutes daily and/or when sitting, a frequency of: - throughout the day Avoid standing for long periods of time. Additional Orders / Instructions: Follow Nutritious Diet - Monitor/Control Blood Sugar Home Health: No change in wound care orders this week; continue Home Health for wound care. May utilize formulary equivalent dressing for wound treatment orders unless otherwise specified. Dressing changes to be completed by [place] on Monday / Wednesday / Friday except when patient has scheduled visit at [place] to come 3x week Other Home Health Orders/Instructions: [name]: Fax [phone] WOUND #1: - Lower Leg Wound Laterality: Right, Circumferential Cleanser: Soap and Water ([place]) 1 x Per Day/30 Days Discharge Instructions: May shower and wash wound with dial antibacterial soap and water prior to dressing change. Cleanser: Wound Cleanser (Home Health) 1 x Per Day/30 Days Discharge Instructions: Cleanse the wound with wound cleanser prior to applying a clean dressing using gauze sponges, not tissue or cotton balls. Peri-Wound Care: Zinc Oxide Ointment 30g tube (Home Health) 1 x Per Day/30 Days Discharge Instructions: Apply Zinc Oxide to periwound with each dressing change Peri-Wound Care: Sween Lotion (Moisturizing lotion) (Home Health) 1 x Per Day/30 Days Discharge Instructions: Apply moisturizing lotion as directed Topical: Compounded Antibiotic 1 x Per Day/30 Days Discharge Instructions: Apply to reddened areas Prim Dressing: KerraCel Ag Gelling Fiber Dressing, 4x5 in (silver alginate) (Home Health) 1 x Per Day/30 Days ary Discharge Instructions: Apply silver alginate to wound bed as instructed Secondary Dressing: ABD Pad, 8x10 (Home Health) 1 x Per Day/30  Days Discharge Instructions: Apply over primary dressing as directed. Secondary Dressing: Zetuvit Plus 4x8 in [facility]) 1 x Per Day/30 Days Discharge Instructions: Apply over primary dressing as directed. Com pression Wrap: Kerlix Roll 4.5x3.1 (in/yd) (Home Health) 1 x Per Day/30 Days Discharge Instructions: Apply Kerlix and Coban compression as directed. Com pression Wrap: Coban Self-Adherent Wrap 4x5 (in/yd) (Home Health) 1 x Per Day/30 Days Discharge Instructions: Apply over Kerlix as directed. 1. Keystone antibiotic ointment 2. Silver alginate 3. Kerlix/Coban 4. Follow-up in 1 week Electronic Signature(s) Signed: 12/03/2021 11:37:20 AM By: [name] DO Entered By: [name] on 12/03/2021 11:03:54 -------------------------------------------------------------------------------- HxROS Details Patient Name: Date of Service: [name], [place] RO [name] L. 12/03/2021 10:15 A M Medical Record Number:  [id number] Patient Account Number: [id number] Date of Birth/Sex: Treating RN: Mar 14, 1940 (81 y.o. [name] Bridges Primary Care Provider: [name] Other Clinician: Referring Provider: Treating Provider/Extender: Durwin Reges in Treatment: 6 Information Obtained From Patient Eyes Medical History: Positive for: Cataracts JAHNASIA, TATUM ([id number]) [id number] Page 7 of 8 Hematologic/Lymphatic Medical History: Positive for: Lymphedema Cardiovascular Medical History: Positive for: Arrhythmia - A fib; Hypertension; Peripheral Venous Disease Endocrine Medical History: Positive for: Type II Diabetes Time with diabetes: 30 years Treated with: Insulin Blood sugar tested every day: Yes Tested : 3x day Genitourinary Medical History: Past Medical History Notes: CKD Musculoskeletal Medical History: Positive for: Osteoarthritis Neurologic Medical History: Positive for: Neuropathy HBO Extended History  Items Eyes: Cataracts Immunizations Pneumococcal Vaccine: Received Pneumococcal Vaccination: Yes Received Pneumococcal Vaccination On or After 60th Birthday: Yes Implantable Devices None Family and Social History Diabetes: Yes - Siblings,Mother; Heart Disease: Yes - Mother; Hypertension: Yes - Mother; Kidney Disease: Yes - Mother; Stroke: Yes - Father; Never smoker; Marital Status - Married; Alcohol Use: Never; Drug Use: No History; Caffeine Use: Rarely; Financial Concerns: No; Food, Clothing or Shelter Needs: No; Support System Lacking: No; Transportation Concerns: No Electronic Signature(s) Signed: 12/03/2021 11:37:20 AM By: [name] DO Signed: 12/03/2021 5:19:10 PM By: Lorrin Jackson Entered By: [name] on 12/03/2021 11:02:15 -------------------------------------------------------------------------------- SuperBill Details Patient Name: Date of Service: [name], [place] RO [name] L. 12/03/2021 Medical Record Number: [id number] Patient Account Number: [id number] Date of Birth/Sex: Treating RN: 03/20/1940 (81 y.o. [name] Bridges Primary Care Provider: [name] Other Clinician: Referring Provider: Treating Provider/Extender: Durwin Reges in Treatment: 6 Diagnosis Coding BRAELYNN, BENNING ([id number]) [id number] Page 8 of 8 ICD-10 Codes Code Description [id number] 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d I87.311 Chronic venous hypertension (idiopathic) with ulcer of right lower extremity E11.622 Type 2 diabetes mellitus with other skin ulcer I48.91 Unspecified atrial fibrillation Z79.01 Long term (current) use of anticoagulants B37.2 Candidiasis of skin and nail Facility Procedures : CPT4 Code: [id number] Description: 99213 - WOUND CARE VISIT-LEV 3 EST PT Modifier: Quantity: 1 Physician Procedures : CPT4 Code Description Modifier [id number] - WC PHYS LEVEL 3 -  EST PT ICD-10 Diagnosis Description I20.355 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d I87.311 Chronic venous hypertension (idiopathic) with ulcer  of right lower extremity E11.622 Type 2 diabetes mellitus with other skin ulcer I48.91 Unspecified atrial fibrillation Quantity: 1 Electronic Signature(s) Signed: 12/03/2021 11:37:20 AM By: [name] DO Entered By: [name] on 12/03/2021 11:04:20

## 2021-12-03 NOTE — Progress Notes (Signed)
[name], [name] ([id number]) [id number] Page 1 of 9 Visit Report for 12/03/2021 Arrival Information Details Patient Name: Date of Service: [name] Bridges, [name] Bridges. 12/03/2021 10:15 A M Medical Record Number: [id number] Patient Account Number: [id number] Date of Birth/Sex: Treating RN: September 04, 1940 (81 y.o. [name] Bridges Primary Care [name] Bridges: [name] Bridges Other Clinician: Referring [name] Bridges: Treating [name] Bridges/Extender: [name] Bridges in Treatment: 6 Visit Information History Since Last Visit Added or deleted any medications: No Patient Arrived: [name] Bridges Any new allergies or adverse reactions: No Arrival Time: 10:35 Had a fall or experienced change in No Transfer Assistance: None activities of daily living that may affect Patient Identification Verified: Yes risk of falls: Secondary Verification Process Completed: Yes Signs or symptoms of abuse/neglect since last visito No Patient Requires Transmission-Based Precautions: No Hospitalized since last visit: No Patient Has Alerts: Yes Implantable device outside of the clinic excluding No Patient Alerts: Patient on Blood Thinner cellular tissue based products placed in the center since last visit: Has Dressing in Place as Prescribed: Yes Has Compression in Place as Prescribed: Yes Pain Present Now: Yes Electronic Signature(s) Signed: 12/03/2021 5:19:10 PM By: Lorrin Jackson Entered By: Lorrin Jackson on 12/03/2021 10:36:14 -------------------------------------------------------------------------------- Clinic Level of Care Assessment Details Patient Name: Date of Service: [name] Bridges [name]. 12/03/2021 10:15 A M Medical Record Number: [id number] Patient Account Number: [id number] Date of Birth/Sex: Treating RN: 02/05/1941 (81 y.o. [name] Bridges Primary Care Raeli Wiens: [name] Bridges Other Clinician: Referring Kristi Hyer: Treating Indiya Izquierdo/Extender: [name] Bridges in Treatment: 6 Clinic Level of Care Assessment Items TOOL 4 Quantity Score X- 1 0 Use when only an EandM is performed on FOLLOW-UP visit ASSESSMENTS - Nursing Assessment / Reassessment X- 1 10 Reassessment of Co-morbidities (includes updates in patient status) X- 1 5 Reassessment of Adherence to Treatment Plan ASSESSMENTS - Wound and Skin A ssessment / Reassessment X - Simple Wound Assessment / Reassessment - one wound 1 5 '[]'$  - 0 Complex Wound Assessment / Reassessment - multiple wounds '[]'$  - 0 Dermatologic / Skin Assessment (not related to wound area) ASSESSMENTS - Focused Assessment '[]'$  - 0 Circumferential Edema Measurements - multi extremities '[]'$  - 0 Nutritional Assessment / Counseling / Intervention [name] Bridges ([id number]) [id number] Page 2 of 9 '[]'$  - 0 Lower Extremity Assessment (monofilament, tuning fork, pulses) '[]'$  - 0 Peripheral Arterial Disease Assessment (using hand held doppler) ASSESSMENTS - Ostomy and/or Continence Assessment and Care '[]'$  - 0 Incontinence Assessment and Management '[]'$  - 0 Ostomy Care Assessment and Management (repouching, etc.) PROCESS - Coordination of Care '[]'$  - 0 Simple Patient / Family Education for ongoing care X- 1 20 Complex (extensive) Patient / Family Education for ongoing care '[]'$  - 0 Staff obtains Programmer, systems, Records, T Results / Process Orders est X- 1 10 Staff telephones HHA, Nursing Homes / Clarify orders / etc '[]'$  - 0 Routine Transfer to another Facility (non-emergent condition) '[]'$  - 0 Routine Hospital Admission (non-emergent condition) '[]'$  - 0 New Admissions / Biomedical engineer / Ordering NPWT Apligraf, etc. , '[]'$  - 0 Emergency Hospital Admission (emergent condition) '[]'$  - 0 Simple Discharge Coordination '[]'$  - 0 Complex (extensive) Discharge Coordination PROCESS - Special Needs '[]'$  - 0 Pediatric / Minor Patient Management '[]'$  - 0 Isolation Patient Management '[]'$  - 0 Hearing  / Language / Visual special needs '[]'$  - 0 Assessment of Community assistance (transportation, D/C planning, etc.) '[]'$  - 0 Additional assistance / Altered mentation '[]'$  - 0 Support Surface(s) Assessment (bed, cushion,  seat, etc.) INTERVENTIONS - Wound Cleansing / Measurement X - Simple Wound Cleansing - one wound 1 5 '[]'$  - 0 Complex Wound Cleansing - multiple wounds X- 1 5 Wound Imaging (photographs - any number of wounds) '[]'$  - 0 Wound Tracing (instead of photographs) X- 1 5 Simple Wound Measurement - one wound '[]'$  - 0 Complex Wound Measurement - multiple wounds INTERVENTIONS - Wound Dressings '[]'$  - 0 Small Wound Dressing one or multiple wounds '[]'$  - 0 Medium Wound Dressing one or multiple wounds X- 1 20 Large Wound Dressing one or multiple wounds '[]'$  - 0 Application of Medications - topical '[]'$  - 0 Application of Medications - injection INTERVENTIONS - Miscellaneous '[]'$  - 0 External ear exam '[]'$  - 0 Specimen Collection (cultures, biopsies, blood, body fluids, etc.) '[]'$  - 0 Specimen(s) / Culture(s) sent or taken to Lab for analysis '[]'$  - 0 Patient Transfer (multiple staff / Civil Service fast streamer / Similar devices) '[]'$  - 0 Simple Staple / Suture removal (25 or less) '[]'$  - 0 Complex Staple / Suture removal (26 or more) '[]'$  - 0 Hypo / Hyperglycemic Management (close monitor of Blood Glucose) [name] Bridges, [name] Bridges ([id number]) [id number] Page 3 of 9 '[]'$  - 0 Ankle / Brachial Index (ABI) - do not check if billed separately X- 1 5 Vital Signs Has the patient been seen at the hospital within the last three years: Yes Total Score: 90 Level Of Care: New/Established - Level 3 Electronic Signature(s) Signed: 12/03/2021 5:19:10 PM By: Lorrin Jackson Entered By: Lorrin Jackson on 12/03/2021 10:59:17 -------------------------------------------------------------------------------- Encounter Discharge Information Details Patient Name: Date of Service: [name] Bridges, [name]  12/03/2021 10:15 A M Medical Record Number: [id number] Patient Account Number: [id number] Date of Birth/Sex: Treating RN: [birth year] (81 y.o. [name] Bridges Primary Care Koji Niehoff: [name] Bridges Other Clinician: Referring Randal Goens: Treating Chasten Blaze/Extender: [name] Bridges in Treatment: 6 Encounter Discharge Information Items Discharge Condition: Stable Ambulatory Status: Walker Discharge Destination: Home Transportation: Private Auto Schedule Follow-up Appointment: Yes Clinical Summary of Care: Provided on 12/03/2021 Form Type Recipient Paper Patient Patient Electronic Signature(s) Signed: 12/03/2021 5:19:10 PM By: Lorrin Jackson Entered By: Lorrin Jackson on 12/03/2021 11:17:06 -------------------------------------------------------------------------------- Lower Extremity Assessment Details Patient Name: Date of Service: [name] Bridges, [name] 12/03/2021 10:15 A M Medical Record Number: [id number] Patient Account Number: [id number] Date of Birth/Sex: Treating RN: 08-15-1940 (81 y.o. [name] Bridges Primary Care Laderius Valbuena: [name] Bridges Other Clinician: Referring Buddy Loeffelholz: Treating Lachlyn Vanderstelt/Extender: [name] Bridges in Treatment: 6 Edema Assessment Assessed: [name] Bridges: No] Patrice Paradise: Yes] Edema: [Left: Ye] [Right: s] Calf Left: Right: Point of Measurement: 24 cm From Medial Instep 43.8 cm Ankle Left: Right: Point of Measurement: 7 cm From Medial Instep 27 cm Bain, Layanna Bridges ([id number]) [id number] Page 4 of 9 Vascular Assessment Pulses: Dorsalis Pedis Palpable: [Right:Yes] Electronic Signature(s) Signed: 12/03/2021 5:19:10 PM By: Lorrin Jackson Entered By: Lorrin Jackson on 12/03/2021 10:45:33 -------------------------------------------------------------------------------- Multi Wound Chart Details Patient Name: Date of Service: [name] Bridges, [name] 12/03/2021 10:15 A M Medical Record  Number: [id number] Patient Account Number: [id number] Date of Birth/Sex: Treating RN: 08/10/1940 (81 y.o. [name] Bridges Primary Care Nubia Ziesmer: [name] Bridges Other Clinician: Referring Keisi Eckford: Treating Venba Zenner/Extender: [name] Bridges in Treatment: 6 Vital Signs Height(in): 62 Capillary Blood Glucose(mg/dl): 134 Weight(lbs): 298 Pulse(bpm): 98 Body Mass Index(BMI): 54.5 Blood Pressure(mmHg): 146/76 Temperature(F): 98.9 Respiratory Rate(breaths/min): 22 [1:Photos:] [N/A:N/A] Right, Circumferential Lower Leg N/A N/A Wound Location: Gradually Appeared N/A N/A Wounding Event: Lymphedema N/A  N/A Primary Etiology: Cataracts, Lymphedema, Arrhythmia, N/A N/A Comorbid History: Hypertension, Peripheral Venous Disease, Type II Diabetes, Osteoarthritis, Neuropathy 07/29/2021 N/A N/A Date Acquired: 6 N/A N/A Weeks of Treatment: Open N/A N/A Wound Status: No N/A N/A Wound Recurrence: 1x1x0.1 N/A N/A Measurements Bridges x W x D (cm) 0.785 N/A N/A A (cm) : rea 0.079 N/A N/A Volume (cm) : 99.50% N/A N/A % Reduction in Area: 99.80% N/A N/A % Reduction in Volume: Full Thickness Without Exposed N/A N/A Classification: Support Structures Medium N/A N/A Exudate Amount: Serous N/A N/A Exudate Type: amber N/A N/A Exudate Color: Distinct, outline attached N/A N/A Wound Margin: Large (67-100%) N/A N/A Granulation Amount: Red, Pink N/A N/A Granulation Quality: None Present (0%) N/A N/A Necrotic Amount: Fat Layer (Subcutaneous Tissue): Yes N/A N/A Exposed Structures: Fascia: No Tendon: No Muscle: No Joint: No Bone: No Large (67-100%) N/A N/A Epithelialization: [name] Bridges, [name] Bridges ([id number]) [id number] Page 5 of 9 Excoriation: No N/A N/A Periwound Skin Texture: Induration: No Callus: No Crepitus: No Rash: No Scarring: No Maceration: No N/A N/A Periwound Skin Moisture: Dry/Scaly: No Atrophie Blanche: No N/A  N/A Periwound Skin Color: Cyanosis: No Ecchymosis: No Erythema: No Hemosiderin Staining: No Mottled: No Pallor: No Rubor: No No Abnormality N/A N/A Temperature: Treatment Notes Electronic Signature(s) Signed: 12/03/2021 11:37:20 AM By: [name] DO Signed: 12/03/2021 5:19:10 PM By: Lorrin Jackson Entered By: [name] on 12/03/2021 11:00:19 -------------------------------------------------------------------------------- Multi-Disciplinary Care Plan Details Patient Name: Date of Service: [name] Bridges, [name] 12/03/2021 10:15 A M Medical Record Number: [id number] Patient Account Number: [id number] Date of Birth/Sex: Treating RN: 12/21/1940 (81 y.o. [name] Bridges Primary Care Petr Bontempo: [name] Bridges Other Clinician: Referring Arianna Haydon: Treating Eoghan Belcher/Extender: [name] Bridges in Treatment: 6 Active Inactive Venous Leg Ulcer Nursing Diagnoses: Actual venous Insuffiency (use after diagnosis is confirmed) Goals: Patient will maintain optimal edema control Date Initiated: 10/21/2021 Target Resolution Date: 12/19/2021 Goal Status: Active Interventions: Assess peripheral edema status every visit. Compression as ordered Provide education on venous insufficiency Treatment Activities: Therapeutic compression applied : 10/21/2021 Notes: Wound/Skin Impairment Nursing Diagnoses: Impaired tissue integrity Goals: Patient/caregiver will verbalize understanding of skin care regimen Date Initiated: 10/21/2021 Target Resolution Date: 12/19/2021 Goal Status: Active Ulcer/skin breakdown will have a volume reduction of 30% by week 4 Date Initiated: 10/21/2021 Target Resolution Date: 12/19/2021 Goal Status: Active MAKANA, FEIGEL ([id number]) [id number] Page 6 of 9 Interventions: Assess patient/caregiver ability to obtain necessary supplies Assess patient/caregiver ability to perform ulcer/skin care regimen upon  admission and as needed Assess ulceration(s) every visit Provide education on ulcer and skin care Treatment Activities: Topical wound management initiated : 10/21/2021 Notes: Electronic Signature(s) Signed: 12/03/2021 5:19:10 PM By: Lorrin Jackson Entered By: Lorrin Jackson on 12/03/2021 10:36:39 -------------------------------------------------------------------------------- Pain Assessment Details Patient Name: Date of Service: [name] Bridges, [name] 12/03/2021 10:15 A M Medical Record Number: [id number] Patient Account Number: [id number] Date of Birth/Sex: Treating RN: 1941-02-10 (81 y.o. [name] Bridges Primary Care Averill Pons: [name] Bridges Other Clinician: Referring Monserrate Blaschke: Treating Dariya Gainer/Extender: [name] Bridges in Treatment: 6 Active Problems Location of Pain Severity and Description of Pain Patient Has Paino No Site Locations Pain Management and Medication Current Pain Management: Electronic Signature(s) Signed: 12/03/2021 5:19:10 PM By: Fara Chute By: Lorrin Jackson on 12/03/2021 10:45:14 Patient/Caregiver Education Details -------------------------------------------------------------------------------- [name] Bridges ([id number]) [id number] Page 7 of 9 Patient Name: Date of Service: [name] Bridges, [name] Bridges. 10/10/2023andnbsp10:15 A M Medical Record Number: [id number] Patient Account Number: [id number]  Date of Birth/Gender: Treating RN: [birth year] (81 y.o. [name] Bridges Primary Care Physician: [name] Bridges Other Clinician: Referring Physician: Treating Physician/Extender: [name] Bridges in Treatment: 6 Education Assessment Education Provided To: Patient Education Topics Provided Venous: Methods: Explain/Verbal, Printed Responses: State content correctly Wound/Skin Impairment: Methods: Explain/Verbal, Printed Responses: State content correctly Electronic  Signature(s) Signed: 12/03/2021 5:19:10 PM By: Lorrin Jackson Entered By: Lorrin Jackson on 12/03/2021 10:37:00 -------------------------------------------------------------------------------- Wound Assessment Details Patient Name: Date of Service: [name] Bridges, [name] 12/03/2021 10:15 A M Medical Record Number: [id number] Patient Account Number: [id number] Date of Birth/Sex: Treating RN: [birth year] (81 y.o. [name] Bridges Primary Care Dekayla Prestridge: [name] Bridges Other Clinician: Referring Franca Stakes: Treating Jlyn Bracamonte/Extender: [name] Bridges in Treatment: 6 Wound Status Wound Number: 1 Primary Lymphedema Etiology: Wound Location: Right, Circumferential Lower Leg Wound Open Wounding Event: Gradually Appeared Status: Date Acquired: 07/29/2021 Comorbid Cataracts, Lymphedema, Arrhythmia, Hypertension, Peripheral Weeks Of Treatment: 6 History: Venous Disease, Type II Diabetes, Osteoarthritis, Neuropathy Clustered Wound: No Photos Wound Measurements Length: (cm) 1 Width: (cm) 1 Depth: (cm) 0.1 Area: (cm) 0.785 Volume: (cm) 0.079 Bridges, [name] Bridges ([id number]) Wound Description Classification: Full Thickness Without Exposed Support Structures Wound Margin: Distinct, outline attached Exudate Amount: Medium Exudate Type: Serous Exudate Color: amber Foul Odor After Cleansing: No Slough/Fibrino No % Reduction in Area: 99.5% % Reduction in Volume: 99.8% Epithelialization: Large (67-100%) Tunneling: No Undermining: No [id number] Page 8 of 9 Wound Bed Granulation Amount: Large (67-100%) Exposed Structure Granulation Quality: Red, Pink Fascia Exposed: No Necrotic Amount: None Present (0%) Fat Layer (Subcutaneous Tissue) Exposed: Yes Tendon Exposed: No Muscle Exposed: No Joint Exposed: No Bone Exposed: No Periwound Skin Texture Texture Color No Abnormalities Noted: Yes No Abnormalities Noted: No Atrophie Blanche:  No Moisture Cyanosis: No No Abnormalities Noted: Yes Ecchymosis: No Erythema: No Hemosiderin Staining: No Mottled: No Pallor: No Rubor: No Temperature / Pain Temperature: No Abnormality Treatment Notes Wound #1 (Lower Leg) Wound Laterality: Right, Circumferential Cleanser Soap and Water Discharge Instruction: May shower and wash wound with dial antibacterial soap and water prior to dressing change. Wound Cleanser Discharge Instruction: Cleanse the wound with wound cleanser prior to applying a clean dressing using gauze sponges, not tissue or cotton balls. Peri-Wound Care Zinc Oxide Ointment 30g tube Discharge Instruction: Apply Zinc Oxide to periwound with each dressing change Sween Lotion (Moisturizing lotion) Discharge Instruction: Apply moisturizing lotion as directed Topical Compounded Antibiotic Discharge Instruction: Apply to reddened areas Primary Dressing KerraCel Ag Gelling Fiber Dressing, 4x5 in (silver alginate) Discharge Instruction: Apply silver alginate to wound bed as instructed Secondary Dressing ABD Pad, 8x10 Discharge Instruction: Apply over primary dressing as directed. Zetuvit Plus 4x8 in Discharge Instruction: Apply over primary dressing as directed. Secured With Compression Wrap Kerlix Roll 4.5x3.1 (in/yd) Discharge Instruction: Apply Kerlix and Coban compression as directed. Coban Self-Adherent Wrap 4x5 (in/yd) Discharge Instruction: Apply over Kerlix as directed. Compression Stockings Add-Ons BRYSTAL, KILDOW ([id number]) [id number] Page 9 of 9 Electronic Signature(s) Signed: 12/03/2021 5:19:10 PM By: Lorrin Jackson Entered By: Lorrin Jackson on 12/03/2021 10:50:22 -------------------------------------------------------------------------------- Vitals Details Patient Name: Date of Service: [name] Bridges, [name] 12/03/2021 10:15 A M Medical Record Number: [id number] Patient Account Number: [id number] Date of  Birth/Sex: Treating RN: 1940/09/13 (81 y.o. [name] Bridges Primary Care Raymund Manrique: [name] Bridges Other Clinician: Referring Shandale Malak: Treating Loi Rennaker/Extender: [name] Bridges in Treatment: 6 Vital Signs Time Taken: 10:37 Temperature (F): 98.9 Height (in): 62 Pulse (bpm): 64 Weight (lbs): 298  Respiratory Rate (breaths/min): 22 Body Mass Index (BMI): 54.5 Blood Pressure (mmHg): 146/76 Capillary Blood Glucose (mg/dl): 134 Reference Range: 80 - 120 mg / dl Electronic Signature(s) Signed: 12/03/2021 5:19:10 PM By: Lorrin Jackson Entered By: Lorrin Jackson on 12/03/2021 10:41:14

## 2021-12-10 ENCOUNTER — Encounter (HOSPITAL_BASED_OUTPATIENT_CLINIC_OR_DEPARTMENT_OTHER): Payer: Medicare HMO | Admitting: Internal Medicine

## 2021-12-10 DIAGNOSIS — L97812 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d: Secondary | ICD-10-CM

## 2021-12-10 DIAGNOSIS — E11622 Type 2 diabetes mellitus with other skin ulcer: Secondary | ICD-10-CM | POA: Diagnosis not present

## 2021-12-10 DIAGNOSIS — I4891 Unspecified atrial fibrillation: Secondary | ICD-10-CM

## 2021-12-10 DIAGNOSIS — I87311 Chronic venous hypertension (idiopathic) with ulcer of right lower extremity: Secondary | ICD-10-CM | POA: Diagnosis not present

## 2021-12-10 NOTE — Progress Notes (Signed)
[name] Bridges ([id number]) [id number] Page 1 of 8 Visit Report for 12/10/2021 Chief Complaint Document Details Patient Name: Date of Service: [name] Bridges, [place] [name] [name] 12/10/2021 9:30 A M Medical Record Number: [id number] Patient Account Number: [id number] Date of Birth/Sex: Treating RN: Jul 11, 1940 (81 y.o. F) Primary Care Provider: [name] Other Clinician: Referring Provider: Treating Provider/Extender: Durwin Reges in Treatment: 7 Information Obtained from: Patient Chief Complaint 10/21/2021; right lower extremity wound Electronic Signature(s) Signed: 12/10/2021 11:13:24 AM By: [name] DO Entered By: [name] on 12/10/2021 10:32:15 -------------------------------------------------------------------------------- HPI Details Patient Name: Date of Service: [name] Bridges, [place] [name] [name] 12/10/2021 9:30 A M Medical Record Number: [id number] Patient Account Number: [id number] Date of Birth/Sex: Treating RN: [birth year] (81 y.o. F) Primary Care Provider: [name] Other Clinician: Referring Provider: Treating Provider/Extender: Durwin Reges in Treatment: 7 History of Present Illness HPI Description: Admission 10/21/2021 Ms. [name] Bridges is an 81 year old female with a past medical history of morbid obesity, insulin-dependent type 2 diabetes, atrial fibrillation on Eliquis and lymphedema/venous insufficiency that presents to the clinic for a right lower extremity wound that has been present for the past 2 months. She has a history of wounds that wax and wane in healing over the past 2 years. She has been following with [facility] for this issue. They have been using Kerlix and Ace bandage along with silver alginate. She is also on torsemide 40 mg daily. She has been taking this dose for the past 5 years. She states she has a lot of drainage from the wound beds and often  requires several dressing changes throughout the day. She has home health that comes out twice weekly. She has lymphedema pumps that she uses twice daily. Currently she denies signs of infection. 9/5; patient presents for follow-up. We have been using PolyMem silver under Kerlix/Coban. She states she tolerated the compression wrap well but felt like it was very tight. Home health has been coming out however they did not have any supplies for the patient and has been using ABD pads with an Ace bandage. She was able to follow-up last week for nurse visit. She is able to make it 24 hours without drainage coming completely through the wrap/ace bandage. She denies signs of infection. 9/12; patient presents for follow-up. We have been using PolyMem silver under Kerlix/Coban. She reports less drainage from the wound bed and can last almost 2 days without a wrap change. 9/19; patient presents for follow-up. We have been using PolyMem silver under Kerlix/Coban. She tried ketoconazole and it improved her symptoms of itching however she still has significant erythema on exam. 9/26; patient presents for follow-up. We have been using PolyMem silver with ketoconazole under Kerlix/Coban. She has no issues with the wrap today. She states she has had this changed every other day by home health. I gave her cephalexin at last clinic visit however she states she broke out in hive. She immediately stopped the medication and had no further issues. She has had cephalosporins in the past without issues. 10/3; patient presents for follow-up. She had a wound culture done at last clinic visit that showed extra high levels of Serratia marcescens, Pseudomonas aeruginosa, Morganella morganii I, Enterococcus faecalis. Keystone antibiotic was ordered. Patient has not been contacted for purchase yet. We have been using calcium alginate under Kerlix/Coban as she does not tolerate a higher compression wrap. 10/10; patient presents for  follow-up. She has been using Keystone antibiotics and  has done this for the past 3 dressing changes. She reports improvement in drainage and a decrease in rednessaround the periwound. We have also been using silver alginate under Kerlix/Coban. 10/17; patient presents for follow-up. She has been using Keystone topical antibiotic spray with silver alginate under Kerlix/Coban. She reports improvement in [name] Bridges, [name] Bridges ([id number]) [id number] Page 2 of 8 wound healing. She reports a decrease in drainage and her wrap not having to be changed as often. Electronic Signature(s) Signed: 12/10/2021 11:13:24 AM By: [name] DO Entered By: [name] on 12/10/2021 10:32:58 -------------------------------------------------------------------------------- Physical Exam Details Patient Name: Date of Service: [name] Bridges, [place] [name] [name] 12/10/2021 9:30 A M Medical Record Number: [id number] Patient Account Number: [id number] Date of Birth/Sex: Treating RN: [birth year] (81 y.o. F) Primary Care Provider: [name] Other Clinician: Referring Provider: Treating Provider/Extender: Durwin Reges in Treatment: 7 Constitutional respirations regular, non-labored and within target range for patient.. Cardiovascular 2+ dorsalis pedis/posterior tibialis pulses. Psychiatric pleasant and cooperative. Notes Right lower extremity: T the posterior medial aspect there are open wounds limited to skin breakdown with minimal weeping. 2+ pitting edema to the knee. o Patient no longer has erythema to the periwound. Electronic Signature(s) Signed: 12/10/2021 11:13:24 AM By: [name] DO Entered By: [name] on 12/10/2021 10:33:49 -------------------------------------------------------------------------------- Physician Orders Details Patient Name: Date of Service: [name] Bridges, [place] [name] [name] 12/10/2021 9:30 A M Medical Record Number:  [id number] Patient Account Number: [id number] Date of Birth/Sex: Treating RN: Jan 13, 1941 (82 y.o. F) Primary Care Provider: [name] Other Clinician: Referring Provider: Treating Provider/Extender: Durwin Reges in Treatment: 7 Verbal / Phone Orders: No Diagnosis Coding ICD-10 Coding Code Description [phone] 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d I87.311 Chronic venous hypertension (idiopathic) with ulcer of right lower extremity E11.622 Type 2 diabetes mellitus with other skin ulcer I48.91 Unspecified atrial fibrillation Z79.01 Long term (current) use of anticoagulants B37.2 Candidiasis of skin and nail Follow-up Appointments ppointment in 1 week. - with Dr. Heber [place] and [name], RN (Room 7) Return A [name] Bridges, [name] Bridges ([id number]) [id number] Page 3 of 8 Anesthetic (In clinic) Topical Lidocaine 5% applied to wound bed (In clinic) Topical Lidocaine 4% applied to wound bed Bathing/ Shower/ Hygiene May shower with protection but do not get wound dressing(s) wet. - Can shower with cast protector bag from [facility], [facility] or [place]. Edema Control - Lymphedema / SCD / Other Lymphedema Pumps. Use Lymphedema pumps on leg(s) 2-3 times a day for 45-60 minutes. If wearing any wraps or hose, do not remove them. Continue exercising as instructed. Elevate legs to the level of the heart or above for 30 minutes daily and/or when sitting, a frequency of: - throughout the day Avoid standing for long periods of time. Additional Orders / Instructions Follow Nutritious Diet - Monitor/Control Blood Sugar Home Health No change in wound care orders this week; continue Home Health for wound care. May utilize formulary equivalent dressing for wound treatment orders unless otherwise specified. Dressing changes to be completed by [place] on Monday / Wednesday / Friday except when patient has scheduled visit  at [place] to come 3x week Other Home Health Orders/Instructions: [name]: Fax [phone] Wound Treatment Wound #1 - Lower Leg Wound Laterality: Right, Circumferential Cleanser: Soap and Water ([place]) 1 x Per Day/30 Days Discharge Instructions: May shower and wash wound with dial antibacterial soap and water prior to dressing change. Cleanser: Wound  Cleanser (Home Health) 1 x Per Day/30 Days Discharge Instructions: Cleanse the wound with wound cleanser prior to applying a clean dressing using gauze sponges, not tissue or cotton balls. Peri-Wound Care: Zinc Oxide Ointment 30g tube (Home Health) 1 x Per Day/30 Days Discharge Instructions: Apply Zinc Oxide to periwound with each dressing change Peri-Wound Care: Sween Lotion (Moisturizing lotion) (Home Health) 1 x Per Day/30 Days Discharge Instructions: Apply moisturizing lotion as directed Topical: Compounded Antibiotic 1 x Per Day/30 Days Discharge Instructions: Apply to reddened areas Prim Dressing: KerraCel Ag Gelling Fiber Dressing, 4x5 in (silver alginate) (Home Health) 1 x Per Day/30 Days ary Discharge Instructions: Apply silver alginate to wound bed as instructed Secondary Dressing: ABD Pad, 8x10 (Home Health) 1 x Per Day/30 Days Discharge Instructions: Apply over primary dressing as directed. Secondary Dressing: Zetuvit Plus 4x8 in [facility]) 1 x Per Day/30 Days Discharge Instructions: Apply over primary dressing as directed. Compression Wrap: Kerlix Roll 4.5x3.1 (in/yd) (Home Health) 1 x Per Day/30 Days Discharge Instructions: Apply Kerlix and Coban compression as directed. Compression Wrap: Coban Self-Adherent Wrap 4x5 (in/yd) (Home Health) 1 x Per Day/30 Days Discharge Instructions: Apply over Kerlix as directed. Electronic Signature(s) Signed: 12/10/2021 11:13:24 AM By: [name] DO Entered By: [name] on 12/10/2021  10:33:55 -------------------------------------------------------------------------------- Problem List Details Patient Name: Date of Service: [name] Bridges, [place] [name] [name] 12/10/2021 9:30 A M Medical Record Number: [id number] Patient Account Number: [id number] Date of Birth/Sex: Treating RN: 07-Jun-1940 (81 y.o. F) Primary Care Provider: [name] Other Clinician: Referring Provider: Treating Provider/Extender: Durwin Reges in Treatment: 7 Weiss, Tobias Alexander ([id number]) [id number] Page 4 of 8 Active Problems ICD-10 Encounter Code Description Active Date MDM Diagnosis L97.812 Non-pressure chronic ulcer of other part of right lower leg with fat layer 10/21/2021 No Yes exposed I87.311 Chronic venous hypertension (idiopathic) with ulcer of right lower extremity 10/21/2021 No Yes E11.622 Type 2 diabetes mellitus with other skin ulcer 10/21/2021 No Yes I48.91 Unspecified atrial fibrillation 10/21/2021 No Yes Z79.01 Long term (current) use of anticoagulants 10/21/2021 No Yes B37.2 Candidiasis of skin and nail 11/05/2021 No Yes Inactive Problems Resolved Problems Electronic Signature(s) Signed: 12/10/2021 11:13:24 AM By: [name] DO Entered By: [name] on 12/10/2021 10:32:03 -------------------------------------------------------------------------------- Progress Note Details Patient Name: Date of Service: [name] Bridges, [place] [name] [name] 12/10/2021 9:30 A M Medical Record Number: [id number] Patient Account Number: [id number] Date of Birth/Sex: Treating RN: 03-21-1940 (81 y.o. F) Primary Care Provider: [name] Other Clinician: Referring Provider: Treating Provider/Extender: Durwin Reges in Treatment: 7 Subjective Chief Complaint Information obtained from Patient 10/21/2021; right lower extremity wound History of Present Illness (HPI) Admission 10/21/2021 Ms. Pearlene Teat is an 81 year old female  with a past medical history of morbid obesity, insulin-dependent type 2 diabetes, atrial fibrillation on Eliquis and lymphedema/venous insufficiency that presents to the clinic for a right lower extremity wound that has been present for the past 2 months. She has a history of wounds that wax and wane in healing over the past 2 years. She has been following with [facility] for this issue. They have been using Kerlix and Ace bandage along with silver alginate. She is also on torsemide 40 mg daily. She has been taking this dose for the past 5 years. She states she has a lot of drainage from the wound beds and often requires several dressing changes throughout the day. She has home health that comes out twice weekly. She has lymphedema pumps  that she uses twice daily. Currently she denies signs of infection. 9/5; patient presents for follow-up. We have been using PolyMem silver under Kerlix/Coban. She states she tolerated the compression wrap well but felt like it was very tight. Home health has been coming out however they did not have any supplies for the patient and has been using ABD pads with an Ace bandage. She was able to follow-up last week for nurse visit. She is able to make it 24 hours without drainage coming completely through the wrap/ace bandage. She denies signs of infection. CAILY, RAKERS ([id number]) [id number] Page 5 of 8 9/12; patient presents for follow-up. We have been using PolyMem silver under Kerlix/Coban. She reports less drainage from the wound bed and can last almost 2 days without a wrap change. 9/19; patient presents for follow-up. We have been using PolyMem silver under Kerlix/Coban. She tried ketoconazole and it improved her symptoms of itching however she still has significant erythema on exam. 9/26; patient presents for follow-up. We have been using PolyMem silver with ketoconazole under Kerlix/Coban. She has no issues with the  wrap today. She states she has had this changed every other day by home health. I gave her cephalexin at last clinic visit however she states she broke out in hive. She immediately stopped the medication and had no further issues. She has had cephalosporins in the past without issues. 10/3; patient presents for follow-up. She had a wound culture done at last clinic visit that showed extra high levels of Serratia marcescens, Pseudomonas aeruginosa, Morganella morganii I, Enterococcus faecalis. Keystone antibiotic was ordered. Patient has not been contacted for purchase yet. We have been using calcium alginate under Kerlix/Coban as she does not tolerate a higher compression wrap. 10/10; patient presents for follow-up. She has been using Keystone antibiotics and has done this for the past 3 dressing changes. She reports improvement in drainage and a decrease in rednessaround the periwound. We have also been using silver alginate under Kerlix/Coban. 10/17; patient presents for follow-up. She has been using Keystone topical antibiotic spray with silver alginate under Kerlix/Coban. She reports improvement in wound healing. She reports a decrease in drainage and her wrap not having to be changed as often. Patient History Information obtained from Patient. Family History Diabetes - Siblings,Mother, Heart Disease - Mother, Hypertension - Mother, Kidney Disease - Mother, Stroke - Father. Social History Never smoker, Marital Status - Married, Alcohol Use - Never, Drug Use - No History, Caffeine Use - Rarely. Medical History Eyes Patient has history of Cataracts Hematologic/Lymphatic Patient has history of Lymphedema Cardiovascular Patient has history of Arrhythmia - A fib, Hypertension, Peripheral Venous Disease Endocrine Patient has history of Type II Diabetes Musculoskeletal Patient has history of Osteoarthritis Neurologic Patient has history of Neuropathy Medical A Surgical History  Notes nd Genitourinary CKD Objective Constitutional respirations regular, non-labored and within target range for patient.. Vitals Time Taken: 9:30 AM, Height: 62 in, Weight: 298 lbs, BMI: 54.5, Temperature: 98.1 F, Pulse: 70 bpm, Respiratory Rate: 18 breaths/min, Blood Pressure: 163/75 mmHg. Cardiovascular 2+ dorsalis pedis/posterior tibialis pulses. Psychiatric pleasant and cooperative. General Notes: Right lower extremity: T the posterior medial aspect there are open wounds limited to skin breakdown with minimal weeping. 2+ pitting edema to o the knee. Patient no longer has erythema to the periwound. Integumentary (Hair, Skin) Wound #1 status is Open. Original cause of wound was Gradually Appeared. The date acquired was: 07/29/2021. The wound has been in treatment 7 weeks. The wound is located on the  Right,Circumferential Lower Leg. The wound measures 2cm length x 2cm width x 0.1cm depth; 3.142cm^2 area and 0.314cm^3 volume. There is Fat Layer (Subcutaneous Tissue) exposed. There is no tunneling or undermining noted. There is a medium amount of serous drainage noted. The wound margin is distinct with the outline attached to the wound base. There is large (67-100%) red, pink granulation within the wound bed. There is no necrotic tissue within the wound bed. The periwound skin appearance had no abnormalities noted for texture. The periwound skin appearance had no abnormalities noted for moisture. The periwound skin appearance exhibited: Hemosiderin Staining. The periwound skin appearance did not exhibit: Atrophie Blanche, Cyanosis, Ecchymosis, Mottled, Pallor, Rubor, Erythema. Periwound temperature was noted as No Abnormality. Assessment [name] Bridges, [name] Bridges ([id number]) [id number] Page 6 of 8 Active Problems ICD-10 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d Chronic venous hypertension (idiopathic) with ulcer of right  lower extremity Type 2 diabetes mellitus with other skin ulcer Unspecified atrial fibrillation Long term (current) use of anticoagulants Candidiasis of skin and nail Patient's wounds have improved in size and appearance since last clinic visit. She no longer has erythema to the periwound. I recommended continuing the course with [facility] antibiotic spray and silver alginate under Kerlix/Coban. Follow-up in 1 week. Plan Follow-up Appointments: Return Appointment in 1 week. - with Dr. Heber [place] and [name], RN (Room 7) Anesthetic: (In clinic) Topical Lidocaine 5% applied to wound bed (In clinic) Topical Lidocaine 4% applied to wound bed Bathing/ Shower/ Hygiene: May shower with protection but do not get wound dressing(s) wet. - Can shower with cast protector bag from [facility], [facility] or [place]. Edema Control - Lymphedema / SCD / Other: Lymphedema Pumps. Use Lymphedema pumps on leg(s) 2-3 times a day for 45-60 minutes. If wearing any wraps or hose, do not remove them. Continue exercising as instructed. Elevate legs to the level of the heart or above for 30 minutes daily and/or when sitting, a frequency of: - throughout the day Avoid standing for long periods of time. Additional Orders / Instructions: Follow Nutritious Diet - Monitor/Control Blood Sugar Home Health: No change in wound care orders this week; continue Home Health for wound care. May utilize formulary equivalent dressing for wound treatment orders unless otherwise specified. Dressing changes to be completed by [place] on Monday / Wednesday / Friday except when patient has scheduled visit at [place] to come 3x week Other Home Health Orders/Instructions: [name]: Fax [phone] WOUND #1: - Lower Leg Wound Laterality: Right, Circumferential Cleanser: Soap and Water ([place]) 1 x Per Day/30 Days Discharge Instructions: May shower and wash wound with dial antibacterial soap and water prior to dressing  change. Cleanser: Wound Cleanser (Home Health) 1 x Per Day/30 Days Discharge Instructions: Cleanse the wound with wound cleanser prior to applying a clean dressing using gauze sponges, not tissue or cotton balls. Peri-Wound Care: Zinc Oxide Ointment 30g tube (Home Health) 1 x Per Day/30 Days Discharge Instructions: Apply Zinc Oxide to periwound with each dressing change Peri-Wound Care: Sween Lotion (Moisturizing lotion) (Home Health) 1 x Per Day/30 Days Discharge Instructions: Apply moisturizing lotion as directed Topical: Compounded Antibiotic 1 x Per Day/30 Days Discharge Instructions: Apply to reddened areas Prim Dressing: KerraCel Ag Gelling Fiber Dressing, 4x5 in (silver alginate) (Home Health) 1 x Per Day/30 Days ary Discharge Instructions: Apply silver alginate to wound bed as instructed Secondary Dressing: ABD Pad, 8x10 (Home Health) 1 x Per Day/30 Days Discharge  Instructions: Apply over primary dressing as directed. Secondary Dressing: Zetuvit Plus 4x8 in [facility]) 1 x Per Day/30 Days Discharge Instructions: Apply over primary dressing as directed. Com pression Wrap: Kerlix Roll 4.5x3.1 (in/yd) (Home Health) 1 x Per Day/30 Days Discharge Instructions: Apply Kerlix and Coban compression as directed. Com pression Wrap: Coban Self-Adherent Wrap 4x5 (in/yd) (Home Health) 1 x Per Day/30 Days Discharge Instructions: Apply over Kerlix as directed. 1. Keystone antibiotic spray with silver alginate under Kerlix/Coban 2. Follow-up in 1 week Electronic Signature(s) Signed: 12/10/2021 11:13:24 AM By: [name] DO Entered By: [name] on 12/10/2021 10:35:00 -------------------------------------------------------------------------------- HxROS Details Patient Name: Date of Service: [name] Bridges, [place] [name] [name] 12/10/2021 9:30 A M Medical Record Number: [id number] Patient Account Number: [id number] [name] Bridges, [name] Bridges ([id number]) [id number] Page 7 of  8 Date of Birth/Sex: Treating RN: May 03, 1940 (81 y.o. F) Primary Care Provider: Other Clinician: [name] Referring Provider: Treating Provider/Extender: Durwin Reges in Treatment: 7 Information Obtained From Patient Eyes Medical History: Positive for: Cataracts Hematologic/Lymphatic Medical History: Positive for: Lymphedema Cardiovascular Medical History: Positive for: Arrhythmia - A fib; Hypertension; Peripheral Venous Disease Endocrine Medical History: Positive for: Type II Diabetes Time with diabetes: 30 years Treated with: Insulin Blood sugar tested every day: Yes Tested : 3x day Genitourinary Medical History: Past Medical History Notes: CKD Musculoskeletal Medical History: Positive for: Osteoarthritis Neurologic Medical History: Positive for: Neuropathy HBO Extended History Items Eyes: Cataracts Immunizations Pneumococcal Vaccine: Received Pneumococcal Vaccination: Yes Received Pneumococcal Vaccination On or After 60th Birthday: Yes Implantable Devices None Family and Social History Diabetes: Yes - Siblings,Mother; Heart Disease: Yes - Mother; Hypertension: Yes - Mother; Kidney Disease: Yes - Mother; Stroke: Yes - Father; Never smoker; Marital Status - Married; Alcohol Use: Never; Drug Use: No History; Caffeine Use: Rarely; Financial Concerns: No; Food, Clothing or Shelter Needs: No; Support System Lacking: No; Transportation Concerns: No Electronic Signature(s) Signed: 12/10/2021 11:13:24 AM By: [name] DO Entered By: [name] on 12/10/2021 10:33:04 Tawny Hopping ([id number]) [id number] Page 8 of 8 -------------------------------------------------------------------------------- SuperBill Details Patient Name: Date of ServiceRush Bridges, [place] [name] [name] 12/10/2021 Medical Record Number: [id number] Patient Account Number: [id number] Date of Birth/Sex: Treating RN: Nov 16, 1940 (81  y.o. [name] Bridges Primary Care Provider: [name] Other Clinician: Referring Provider: Treating Provider/Extender: Durwin Reges in Treatment: 7 Diagnosis Coding ICD-10 Codes Code Description [id number] 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d I87.311 Chronic venous hypertension (idiopathic) with ulcer of right lower extremity E11.622 Type 2 diabetes mellitus with other skin ulcer I48.91 Unspecified atrial fibrillation Z79.01 Long term (current) use of anticoagulants B37.2 Candidiasis of skin and nail Facility Procedures : CPT4 Code: [id number] Description: [place] VISIT-LEV 3 EST PT Modifier: 25 Quantity: 1 Physician Procedures : CPT4 Code Description Modifier [id number] - WC PHYS LEVEL 3 - EST PT ICD-10 Diagnosis Description G86.761 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d I87.311 Chronic venous hypertension (idiopathic) with ulcer  of right lower extremity E11.622 Type 2 diabetes mellitus with other skin ulcer I48.91 Unspecified atrial fibrillation Quantity: 1 Electronic Signature(s) Signed: 12/10/2021 11:13:24 AM By: [name] DO Entered By: [name] on 12/10/2021 10:35:20

## 2021-12-13 NOTE — Progress Notes (Signed)
[name] Bridges, [name] Bridges ([id number]) [phone].pdf Page 1 of 9 Visit Report for 12/10/2021 Arrival Information Details Patient Name: Date of Service: [name], [place] [name] [name] 12/10/2021 9:30 A M Medical Record Number: [id number] Patient Account Number: [id number] Date of Birth/Sex: Treating RN: 09-30-1940 (81 y.o. F) Primary Care Berta Denson: [name] Other Clinician: Referring Shalimar Mcclain: Treating Kashtyn Jankowski/Extender: Durwin Reges in Treatment: 7 Visit Information History Since Last Visit All ordered tests and consults were completed: No Patient Arrived: [name] Added or deleted any medications: No Arrival Time: 09:35 Any new allergies or adverse reactions: No Transfer Assistance: None Had a fall or experienced change in No Patient Identification Verified: Yes activities of daily living that may affect Secondary Verification Process Completed: Yes risk of falls: Patient Requires Transmission-Based Precautions: No Signs or symptoms of abuse/neglect since last visito No Patient Has Alerts: Yes Hospitalized since last visit: No Patient Alerts: Patient on Blood Thinner Implantable device outside of the clinic excluding No cellular tissue based products placed in the center since last visit: Pain Present Now: No Electronic Signature(s) Signed: 12/13/2021 11:34:44 AM By: Worthy Rancher Entered By: Worthy Rancher on 12/10/2021 09:35:37 -------------------------------------------------------------------------------- Clinic Level of Care Assessment Details Patient Name: Date of Service: [name], [place] [name] [name] 12/10/2021 9:30 A M Medical Record Number: [id number] Patient Account Number: [id number] Date of Birth/Sex: Treating RN: 07/30/1940 (81 y.o. [name] Bridges Primary Care Alee Gressman: [name] Other Clinician: Referring Cooper Moroney: Treating Tarika Mckethan/Extender: Durwin Reges in Treatment: 7 Clinic Level of Care  Assessment Items TOOL 4 Quantity Score X- 1 0 Use when only an EandM is performed on FOLLOW-UP visit ASSESSMENTS - Nursing Assessment / Reassessment X- 1 10 Reassessment of Co-morbidities (includes updates in patient status) X- 1 5 Reassessment of Adherence to Treatment Plan ASSESSMENTS - Wound and Skin A ssessment / Reassessment X - Simple Wound Assessment / Reassessment - one wound 1 5 '[]'$  - 0 Complex Wound Assessment / Reassessment - multiple wounds '[]'$  - 0 Dermatologic / Skin Assessment (not related to wound area) ASSESSMENTS - Focused Assessment X- 1 5 Circumferential Edema Measurements - multi extremities '[]'$  - 0 Nutritional Assessment / Counseling / Intervention SHAUNIE, BOEHM ([id number]) [phone].pdf Page 2 of 9 '[]'$  - 0 Lower Extremity Assessment (monofilament, tuning fork, pulses) '[]'$  - 0 Peripheral Arterial Disease Assessment (using hand held doppler) ASSESSMENTS - Ostomy and/or Continence Assessment and Care '[]'$  - 0 Incontinence Assessment and Management '[]'$  - 0 Ostomy Care Assessment and Management (repouching, etc.) PROCESS - Coordination of Care X - Simple Patient / Family Education for ongoing care 1 15 '[]'$  - 0 Complex (extensive) Patient / Family Education for ongoing care X- 1 10 Staff obtains Programmer, systems, Records, T Results / Process Orders est X- 1 10 Staff telephones HHA, Nursing Homes / Clarify orders / etc '[]'$  - 0 Routine Transfer to another Facility (non-emergent condition) '[]'$  - 0 Routine Hospital Admission (non-emergent condition) '[]'$  - 0 New Admissions / Biomedical engineer / Ordering NPWT Apligraf, etc. , '[]'$  - 0 Emergency Hospital Admission (emergent condition) '[]'$  - 0 Simple Discharge Coordination '[]'$  - 0 Complex (extensive) Discharge Coordination PROCESS - Special Needs '[]'$  - 0 Pediatric / Minor Patient Management '[]'$  - 0 Isolation Patient Management '[]'$  - 0 Hearing / Language / Visual special needs '[]'$  -  0 Assessment of Community assistance (transportation, D/C planning, etc.) '[]'$  - 0 Additional assistance / Altered mentation '[]'$  - 0 Support Surface(s) Assessment (bed, cushion, seat, etc.) INTERVENTIONS - Wound Cleansing /  Measurement X - Simple Wound Cleansing - one wound 1 5 '[]'$  - 0 Complex Wound Cleansing - multiple wounds X- 1 5 Wound Imaging (photographs - any number of wounds) '[]'$  - 0 Wound Tracing (instead of photographs) X- 1 5 Simple Wound Measurement - one wound '[]'$  - 0 Complex Wound Measurement - multiple wounds INTERVENTIONS - Wound Dressings '[]'$  - 0 Small Wound Dressing one or multiple wounds '[]'$  - 0 Medium Wound Dressing one or multiple wounds X- 1 20 Large Wound Dressing one or multiple wounds '[]'$  - 0 Application of Medications - topical '[]'$  - 0 Application of Medications - injection INTERVENTIONS - Miscellaneous '[]'$  - 0 External ear exam '[]'$  - 0 Specimen Collection (cultures, biopsies, blood, body fluids, etc.) '[]'$  - 0 Specimen(s) / Culture(s) sent or taken to Lab for analysis '[]'$  - 0 Patient Transfer (multiple staff / Civil Service fast streamer / Similar devices) '[]'$  - 0 Simple Staple / Suture removal (25 or less) '[]'$  - 0 Complex Staple / Suture removal (26 or more) '[]'$  - 0 Hypo / Hyperglycemic Management (close monitor of Blood Glucose) Lebeck, Prescious L ([id number]) [id number].pdf Page 3 of 9 '[]'$  - 0 Ankle / Brachial Index (ABI) - do not check if billed separately X- 1 5 Vital Signs Has the patient been seen at the hospital within the last three years: Yes Total Score: 100 Level Of Care: New/Established - Level 3 Electronic Signature(s) Signed: 12/10/2021 5:18:48 PM By: Sharyn Creamer RN, BSN Entered By: Sharyn Creamer on 12/10/2021 10:31:16 -------------------------------------------------------------------------------- Encounter Discharge Information Details Patient Name: Date of Service: [name], [place] [name] [name] 12/10/2021 9:30 A M Medical Record  Number: [id number] Patient Account Number: [id number] Date of Birth/Sex: Treating RN: Apr 28, 1940 (81 y.o. [name] Bridges Primary Care Tennis Mckinnon: [name] Other Clinician: Referring Jamerson Vonbargen: Treating Suhaan Perleberg/Extender: Durwin Reges in Treatment: 7 Encounter Discharge Information Items Discharge Condition: Stable Ambulatory Status: Walker Discharge Destination: Home Transportation: Private Auto Accompanied By: self Schedule Follow-up Appointment: Yes Clinical Summary of Care: Patient Declined Electronic Signature(s) Signed: 12/10/2021 5:18:48 PM By: Sharyn Creamer RN, BSN Entered By: Sharyn Creamer on 12/10/2021 10:32:02 -------------------------------------------------------------------------------- Lower Extremity Assessment Details Patient Name: Date of Service: [name], [place] [name] [name] 12/10/2021 9:30 A M Medical Record Number: [id number] Patient Account Number: [id number] Date of Birth/Sex: Treating RN: 1940/05/28 (81 y.o. F) Primary Care Kollins Fenter: [name] Other Clinician: Referring Shayleen Eppinger: Treating Tiffine Henigan/Extender: Durwin Reges in Treatment: 7 Edema Assessment Assessed: Shirlyn Goltz: No] [Right: No] Edema: [Left: Ye] [Right: s] Calf Left: Right: Point of Measurement: 24 cm From Medial Instep 48 cm Ankle Left: Right: Point of Measurement: 7 cm From Medial Instep 26.5 cm Vascular Assessment Helget, Valeree L ([id number]) [Right:[id number] Page 4 of 9] Pulses: Dorsalis Pedis Palpable: [Right:Yes] Electronic Signature(s) Signed: 12/13/2021 11:34:44 AM By: Worthy Rancher Entered By: Worthy Rancher on 12/10/2021 10:10:30 -------------------------------------------------------------------------------- Multi Wound Chart Details Patient Name: Date of Service: [name], [place] [name] [name] 12/10/2021 9:30 A M Medical Record Number: [id number] Patient Account Number: [id number] Date of Birth/Sex:  Treating RN: February 27, 1940 (81 y.o. F) Primary Care Yotam Rhine: [name] Other Clinician: Referring Atalya Dano: Treating Caramia Boutin/Extender: Durwin Reges in Treatment: 7 Vital Signs Height(in): 67 Pulse(bpm): 64 Weight(lbs): 350 Blood Pressure(mmHg): 163/75 Body Mass Index(BMI): 54.5 Temperature(F): 98.1 Respiratory Rate(breaths/min): 18 [1:Photos:] [N/A:N/A] Right, Circumferential Lower Leg N/A N/A Wound Location: Gradually Appeared N/A N/A Wounding Event: Lymphedema N/A N/A Primary Etiology: Cataracts, Lymphedema, Arrhythmia, N/A N/A Comorbid History: Hypertension, Peripheral Venous Disease, Type  II Diabetes, Osteoarthritis, Neuropathy 07/29/2021 N/A N/A Date Acquired: 7 N/A N/A Weeks of Treatment: Open N/A N/A Wound Status: No N/A N/A Wound Recurrence: 2x2x0.1 N/A N/A Measurements L x W x D (cm) 3.142 N/A N/A A (cm) : rea 0.314 N/A N/A Volume (cm) : 98.10% N/A N/A % Reduction in Area: 99.00% N/A N/A % Reduction in Volume: Full Thickness Without Exposed N/A N/A Classification: Support Structures Medium N/A N/A Exudate Amount: Serous N/A N/A Exudate Type: amber N/A N/A Exudate Color: Distinct, outline attached N/A N/A Wound Margin: Large (67-100%) N/A N/A Granulation Amount: Red, Pink N/A N/A Granulation Quality: None Present (0%) N/A N/A Necrotic Amount: Fat Layer (Subcutaneous Tissue): Yes N/A N/A Exposed Structures: Fascia: No Tendon: No Muscle: No Joint: No Bone: No Large (67-100%) N/A N/A Epithelialization: Excoriation: No N/A N/A Periwound Skin Texture: Induration: No Callus: No Bick, Sedra L ([id number]) [phone].pdf Page 5 of 9 Crepitus: No Rash: No Scarring: No Maceration: No N/A N/A Periwound Skin Moisture: Dry/Scaly: No Hemosiderin Staining: Yes N/A N/A Periwound Skin Color: Atrophie Blanche: No Cyanosis: No Ecchymosis: No Erythema: No Mottled: No Pallor:  No Rubor: No No Abnormality N/A N/A Temperature: Treatment Notes Wound #1 (Lower Leg) Wound Laterality: Right, Circumferential Cleanser Soap and Water Discharge Instruction: May shower and wash wound with dial antibacterial soap and water prior to dressing change. Wound Cleanser Discharge Instruction: Cleanse the wound with wound cleanser prior to applying a clean dressing using gauze sponges, not tissue or cotton balls. Peri-Wound Care Zinc Oxide Ointment 30g tube Discharge Instruction: Apply Zinc Oxide to periwound with each dressing change Sween Lotion (Moisturizing lotion) Discharge Instruction: Apply moisturizing lotion as directed Topical Compounded Antibiotic Discharge Instruction: Apply to reddened areas Primary Dressing KerraCel Ag Gelling Fiber Dressing, 4x5 in (silver alginate) Discharge Instruction: Apply silver alginate to wound bed as instructed Secondary Dressing ABD Pad, 8x10 Discharge Instruction: Apply over primary dressing as directed. Zetuvit Plus 4x8 in Discharge Instruction: Apply over primary dressing as directed. Secured With Compression Wrap Kerlix Roll 4.5x3.1 (in/yd) Discharge Instruction: Apply Kerlix and Coban compression as directed. Coban Self-Adherent Wrap 4x5 (in/yd) Discharge Instruction: Apply over Kerlix as directed. Compression Stockings Add-Ons Electronic Signature(s) Signed: 12/10/2021 11:13:24 AM By: [name] DO Entered By: [name] on 12/10/2021 10:32:08 -------------------------------------------------------------------------------- Multi-Disciplinary Care Plan Details Patient Name: Date of Service: [name], [place] [name] [name] 12/10/2021 9:30 A M Medical Record Number: [id number] Patient Account Number: [id number] Date of Birth/Sex: Treating RN: 24-Jan-1941 (81 y.o. 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 Compression as ordered Provide education on venous insufficiency Treatment Activities: Therapeutic compression applied : 10/21/2021 Notes: Wound/Skin Impairment Nursing Diagnoses: Impaired tissue integrity Goals: Patient/caregiver will verbalize understanding of skin care regimen Date Initiated: 10/21/2021 Target Resolution Date: 12/19/2021 Goal Status: Active Ulcer/skin breakdown will have a volume reduction of 30% by week 4 Date Initiated: 10/21/2021 Target Resolution Date: 12/19/2021 Goal Status: Active Interventions: Assess patient/caregiver ability to obtain necessary supplies Assess patient/caregiver ability to perform ulcer/skin care regimen upon admission and as needed Assess ulceration(s) every visit Provide education on ulcer and skin care Treatment Activities: Topical wound management initiated : 10/21/2021 Notes:  Electronic Signature(s) Signed: 12/10/2021 5:18:48 PM By: Sharyn Creamer RN, BSN Entered By: Sharyn Creamer on 12/10/2021 10:29:20 -------------------------------------------------------------------------------- Pain Assessment Details Patient Name: Date of Service: [name], [place] [name] [name] 12/10/2021 9:30 A M Medical Record Number: [id number] Patient Account Number: [id number] Date of Birth/Sex: Treating RN: 05/15/1940 (81 y.o. F) Primary Care Freeda Spivey: [name] Other Clinician: Referring Dow Blahnik: Treating Timarion Agcaoili/Extender: Durwin Reges in Treatment: 7 Active Problems Location of Pain Severity and  Description of Pain Grether, Fiorela L ([id number]) [phone].pdf Page 7 of 9 Patient Has Paino No Site Locations Pain Management and Medication Current Pain Management: Electronic Signature(s) Signed: 12/13/2021 11:34:44 AM By: Worthy Rancher Entered By: Worthy Rancher on 12/10/2021 10:09:38 -------------------------------------------------------------------------------- Patient/Caregiver Education Details Patient Name: Date of Service: [name], [place] [name] [name] 10/17/2023andnbsp9:30 A M Medical Record Number: [id number] Patient Account Number: [id number] Date of Birth/Gender: Treating RN: [birth year] (81 y.o. [name] Bridges Primary Care Physician: [name] Other Clinician: Referring Physician: Treating Physician/Extender: Durwin Reges in Treatment: 7 Education Assessment Education Provided To: Patient Education Topics Provided Wound/Skin Impairment: Methods: Explain/Verbal Responses: State content correctly [employer]) Signed: 12/10/2021 5:18:48 PM By: Sharyn Creamer RN, BSN Entered By: Sharyn Creamer on 12/10/2021 10:29:54 -------------------------------------------------------------------------------- Wound Assessment Details Patient Name: Date of Service: [name], [place] [name] [name] 12/10/2021 9:30 A M Medical Record Number: [id number] Patient Account Number: [id number] Date of Birth/Sex: Treating RN: [birth year] (81 y.o. Chastin Garlitz, Kortnee L ([id number]) [id number] Page 8 of 9 Primary Care Ajwa Kimberley: [name] Other Clinician: Referring Rosebud Grosser: Treating Joleen Stuckert/Extender: Durwin Reges in Treatment: 7 Wound Status Wound Number: 1 Primary Lymphedema Etiology: Wound Location: Right, Circumferential Lower Leg Wound Open Wounding Event: Gradually Appeared Status: Date Acquired: 07/29/2021 Comorbid Cataracts, Lymphedema, Arrhythmia, Hypertension,  Peripheral Weeks Of Treatment: 7 History: Venous Disease, Type II Diabetes, Osteoarthritis, Neuropathy Clustered Wound: No Photos Wound Measurements Length: (cm) 2 Width: (cm) 2 Depth: (cm) 0.1 Area: (cm) 3.142 Volume: (cm) 0.314 % Reduction in Area: 98.1% % Reduction in Volume: 99% Epithelialization: Large (67-100%) Tunneling: No Undermining: No Wound Description Classification: Full Thickness Without Exposed Support Structures Wound Margin: Distinct, outline attached Exudate Amount: Medium Exudate Type: Serous Exudate Color: amber Foul Odor After Cleansing: No Slough/Fibrino No Wound Bed Granulation Amount: Large (67-100%) Exposed Structure Granulation Quality: Red, Pink Fascia Exposed: No Necrotic Amount: None Present (0%) Fat Layer (Subcutaneous Tissue) Exposed: Yes Tendon Exposed: No Muscle Exposed: No Joint Exposed: No Bone Exposed: No Periwound Skin Texture Texture Color No Abnormalities Noted: Yes No Abnormalities Noted: No Atrophie Blanche: No Moisture Cyanosis: No No Abnormalities Noted: Yes Ecchymosis: No Erythema: No Hemosiderin Staining: Yes Mottled: No Pallor: No Rubor: No Temperature / Pain Temperature: No Abnormality Treatment Notes Wound #1 (Lower Leg) Wound Laterality: Right, Circumferential Cleanser Soap and Water Discharge Instruction: May shower and wash wound with dial antibacterial soap and water prior to dressing change. Wound Cleanser Discharge Instruction: Cleanse the wound with wound cleanser prior to applying a clean dressing using gauze sponges, not tissue or cotton balls. TORRIA, FROMER ([id number]) [phone].pdf Page 9 of 9 Peri-Wound Care Zinc Oxide Ointment 30g tube Discharge Instruction: Apply Zinc Oxide to periwound with each dressing change Sween Lotion (Moisturizing lotion) Discharge Instruction: Apply moisturizing lotion as directed Topical Compounded Antibiotic Discharge Instruction:  Apply to reddened areas Primary Dressing KerraCel Ag Gelling Fiber Dressing, 4x5 in (silver alginate) Discharge Instruction: Apply silver alginate to wound bed as instructed Secondary Dressing ABD Pad, 8x10 Discharge Instruction: Apply  over primary dressing as directed. Zetuvit Plus 4x8 in Discharge Instruction: Apply over primary dressing as directed. Secured With Compression Wrap Kerlix Roll 4.5x3.1 (in/yd) Discharge Instruction: Apply Kerlix and Coban compression as directed. Coban Self-Adherent Wrap 4x5 (in/yd) Discharge Instruction: Apply over Kerlix as directed. Compression Stockings Add-Ons Electronic Signature(s) Signed: 12/10/2021 5:18:48 PM By: Sharyn Creamer RN, BSN Entered By: Sharyn Creamer on 12/10/2021 10:13:42 -------------------------------------------------------------------------------- Vitals Details Patient Name: Date of Service: [name], [place] [name] [name] 12/10/2021 9:30 A M Medical Record Number: [id number] Patient Account Number: [id number] Date of Birth/Sex: Treating RN: 28-Jan-1941 (81 y.o. F) Primary Care Florinda Taflinger: [name] Other Clinician: Referring Wm Sahagun: Treating Jayleigh Notarianni/Extender: Durwin Reges in Treatment: 7 Vital Signs Time Taken: 09:30 Temperature (F): 98.1 Height (in): 62 Pulse (bpm): 70 Weight (lbs): 298 Respiratory Rate (breaths/min): 18 Body Mass Index (BMI): 54.5 Blood Pressure (mmHg): 163/75 Reference Range: 80 - 120 mg / dl Electronic Signature(s) Signed: 12/13/2021 11:34:44 AM By: Worthy Rancher Entered By: Worthy Rancher on 12/10/2021 09:36:12

## 2021-12-17 ENCOUNTER — Encounter (HOSPITAL_BASED_OUTPATIENT_CLINIC_OR_DEPARTMENT_OTHER): Payer: Medicare HMO | Admitting: General Surgery

## 2021-12-17 DIAGNOSIS — E11622 Type 2 diabetes mellitus with other skin ulcer: Secondary | ICD-10-CM | POA: Diagnosis not present

## 2021-12-17 NOTE — Progress Notes (Signed)
CANDELARIA, PIES ([id number]) [id number] Page 1 of 9 Visit Report for 12/17/2021 Arrival Information Details Patient Name: Date of Service: [name], [name] 12/17/2021 12:30 PM Medical Record Number: [id number] Patient Account Number: [id number] Date of Birth/Sex: Treating RN: 1940/05/13 (81 y.o. America Brown Primary Care Dann Ventress: [name] Other Clinician: Referring Aldena Worm: Treating Demarrius Guerrero/Extender: Latanya Maudlin in Treatment: 8 Visit Information History Since Last Visit Added or deleted any medications: No Patient Arrived: [name] Any new allergies or adverse reactions: No Arrival Time: 12:40 Had a fall or experienced change in No Accompanied By: self activities of daily living that may affect Transfer Assistance: None risk of falls: Patient Identification Verified: Yes Signs or symptoms of abuse/neglect since last visito No Patient Requires Transmission-Based Precautions: No Hospitalized since last visit: No Patient Has Alerts: Yes Implantable device outside of the clinic excluding No Patient Alerts: Patient on Blood Thinner cellular tissue based products placed in the center since last visit: Has Dressing in Place as Prescribed: Yes Has Compression in Place as Prescribed: Yes Pain Present Now: Yes Electronic Signature(s) Signed: 12/17/2021 5:27:01 PM By: Dellie Catholic RN Entered By: Dellie Catholic on 12/17/2021 12:40:44 -------------------------------------------------------------------------------- Encounter Discharge Information Details Patient Name: Date of Service: [name], [name] 12/17/2021 12:30 PM Medical Record Number: [id number] Patient Account Number: [id number] Date of Birth/Sex: Treating RN: 03/22/1940 (81 y.o. America Brown Primary Care Abigayle Wilinski: [name] Other Clinician: Referring Avianah Pellman: Treating Salimatou Simone/Extender: Latanya Maudlin in  Treatment: 8 Encounter Discharge Information Items Post Procedure Vitals Discharge Condition: Stable Temperature (F): 98.3 Ambulatory Status: Walker Pulse (bpm): 71 Discharge Destination: Home Respiratory Rate (breaths/min): 18 Transportation: Private Auto Blood Pressure (mmHg): 136/65 Accompanied By: self Schedule Follow-up Appointment: Yes Clinical Summary of Care: Patient Declined Electronic Signature(s) Signed: 12/17/2021 5:27:01 PM By: Dellie Catholic RN Entered By: Dellie Catholic on 12/17/2021 17:25:44 Moultrie, Tobias Alexander ([id number]) [id number] Page 2 of 9 -------------------------------------------------------------------------------- Lower Extremity Assessment Details Patient Name: Date of ServiceRush Landmark, [name] 12/17/2021 12:30 PM Medical Record Number: [id number] Patient Account Number: [id number] Date of Birth/Sex: Treating RN: 15-Mar-1940 (81 y.o. America Brown Primary Care Charley Miske: [name] Other Clinician: Referring Kanyia Heaslip: Treating Taro Hidrogo/Extender: Latanya Maudlin in Treatment: 8 Edema Assessment Assessed: Shirlyn Goltz: No] Patrice Paradise: No] Edema: [Left: Ye] [Right: s] Calf Left: Right: Point of Measurement: 24 cm From Medial Instep 48.5 cm Ankle Left: Right: Point of Measurement: 7 cm From Medial Instep 26 cm Vascular Assessment Pulses: Dorsalis Pedis Palpable: [Right:Yes] Electronic Signature(s) Signed: 12/17/2021 5:27:01 PM By: Dellie Catholic RN Entered By: Dellie Catholic on 12/17/2021 12:52:21 -------------------------------------------------------------------------------- Multi Wound Chart Details Patient Name: Date of Service: [name], [name] 12/17/2021 12:30 PM Medical Record Number: [id number] Patient Account Number: [id number] Date of Birth/Sex: Treating RN: June 01, 1940 (81 y.o. F) Primary Care Pati Thinnes: [name] Other Clinician: Referring Gerrica Cygan: Treating  Ruthe Roemer/Extender: Latanya Maudlin in Treatment: 8 Vital Signs Height(in): 62 Pulse(bpm): 71 Weight(lbs): 834 Blood Pressure(mmHg): 136/65 Body Mass Index(BMI): 54.5 Temperature(F): 98.3 Respiratory Rate(breaths/min): 18 [1:Photos:] [N/A:N/A [id number] Page 3 of 9] Right, Circumferential Lower Leg Right, Medial Upper Leg N/A Wound Location: Gradually Appeared Blister N/A Wounding Event: Lymphedema Lymphedema N/A Primary Etiology: Cataracts, Lymphedema, Arrhythmia, Cataracts, Lymphedema, Arrhythmia, N/A Comorbid History: Hypertension, Peripheral Venous Hypertension, Peripheral Venous Disease, Type II Diabetes, Disease, Type II Diabetes, Osteoarthritis, Neuropathy Osteoarthritis, Neuropathy 07/29/2021 12/17/2021 N/A Date Acquired: 8 0 N/A Weeks of Treatment: Open Open N/A Wound  Status: No No N/A Wound Recurrence: 13x29x0.1 4.5x5x0.1 N/A Measurements L x W x D (cm) 296.095 17.671 N/A A (cm) : rea 29.61 1.767 N/A Volume (cm) : -81.20% N/A N/A % Reduction in A rea: 9.40% N/A N/A % Reduction in Volume: Full Thickness Without Exposed Full Thickness Without Exposed N/A Classification: Support Structures Support Structures Medium Large N/A Exudate A mount: Serous Serous N/A Exudate Type: Physiological scientist N/A Exudate Color: Distinct, outline attached Flat and Intact N/A Wound Margin: Large (67-100%) Large (67-100%) N/A Granulation A mount: Red, Pink Red N/A Granulation Quality: None Present (0%) None Present (0%) N/A Necrotic A mount: Fat Layer (Subcutaneous Tissue): Yes Fat Layer (Subcutaneous Tissue): Yes N/A Exposed Structures: Fascia: No Fascia: No Tendon: No Tendon: No Muscle: No Muscle: No Joint: No Joint: No Bone: No Bone: No Small (1-33%) None N/A Epithelialization: Debridement - Selective/Open Wound Debridement - Selective/Open Wound N/A Debridement: Pre-procedure Verification/Time Out 13:08 13:08  N/A Taken: Lidocaine 4% Topical Solution Lidocaine 4% Topical Solution N/A Pain Control: Non-Viable Tissue Non-Viable Tissue N/A Level: 377 22.5 N/A Debridement A (sq cm): rea Curette Curette N/A Instrument: Minimum Minimum N/A Bleeding: Pressure Pressure N/A Hemostasis A chieved: 0 0 N/A Procedural Pain: 0 0 N/A Post Procedural Pain: Procedure was tolerated well Procedure was tolerated well N/A Debridement Treatment Response: 13x29x0.1 4.5x5x0.1 N/A Post Debridement Measurements L x W x D (cm) 29.61 1.767 N/A Post Debridement Volume: (cm) Excoriation: No N/A Periwound Skin Texture: Induration: No Callus: No Crepitus: No Rash: No Scarring: No Maceration: No N/A Periwound Skin Moisture: Dry/Scaly: No Hemosiderin Staining: Yes Erythema: Yes N/A Periwound Skin Color: Atrophie Blanche: No Cyanosis: No Ecchymosis: No Erythema: No Mottled: No Pallor: No Rubor: No N/A Circumferential N/A Erythema Location: No Abnormality N/A N/A Temperature: Range, Korbyn L ([id number]) [id number] Page 4 of 9 Debridement N/A N/A Procedures Performed: Treatment Notes Electronic Signature(s) Signed: 12/17/2021 1:18:13 PM By: Fredirick Maudlin MD FACS Entered By: Fredirick Maudlin on 12/17/2021 13:18:12 -------------------------------------------------------------------------------- Multi-Disciplinary Care Plan Details Patient Name: Date of Service: [name], [name] 12/17/2021 12:30 PM Medical Record Number: [id number] Patient Account Number: [id number] Date of Birth/Sex: Treating RN: [birth year] (81 y.o. America Brown Primary Care Halton Neas: [name] Other Clinician: Referring Eber Ferrufino: Treating Aleksis Jiggetts/Extender: Latanya Maudlin in Treatment: 8 Active Inactive Venous Leg Ulcer Nursing Diagnoses: Actual venous Insuffiency (use after diagnosis is confirmed) Goals: Patient will maintain optimal edema  control Date Initiated: 10/21/2021 Target Resolution Date: 02/23/2022 Goal Status: Active Interventions: Assess peripheral edema status every visit. Compression as ordered Provide education on venous insufficiency Treatment Activities: Therapeutic compression applied : 10/21/2021 Notes: Wound/Skin Impairment Nursing Diagnoses: Impaired tissue integrity Goals: Patient/caregiver will verbalize understanding of skin care regimen Date Initiated: 10/21/2021 Target Resolution Date: 02/23/2022 Goal Status: Active Ulcer/skin breakdown will have a volume reduction of 30% by week 4 Date Initiated: 10/21/2021 Target Resolution Date: 02/24/2022 Goal Status: Active Interventions: Assess patient/caregiver ability to obtain necessary supplies Assess patient/caregiver ability to perform ulcer/skin care regimen upon admission and as needed Assess ulceration(s) every visit Provide education on ulcer and skin care Treatment Activities: Topical wound management initiated : 10/21/2021 Notes: Electronic Signature(s) Signed: 12/17/2021 5:27:01 PM By: Dellie Catholic RN [place]hoose, Tobias Alexander ([id number]) PM By: Dellie Catholic RN [phone].pdf Page 5 of 9 Signed: 12/17/2021 5:27:01 Entered By: Dellie Catholic on 12/17/2021 17:24:20 -------------------------------------------------------------------------------- Pain Assessment Details Patient Name: Date of ServiceRush Landmark, [name] 12/17/2021 12:30 PM Medical Record Number: [id number] Patient Account Number: [id number] Date of  Birth/Sex: Treating RN: [birth year] (81 y.o. America Brown Primary Care Loda Bialas: [name] Other Clinician: Referring Ayomide Zuleta: Treating Kaylene Dawn/Extender: Latanya Maudlin in Treatment: 8 Active Problems Location of Pain Severity and Description of Pain Patient Has Paino Yes Site Locations Pain Location: Generalized Pain With Dressing Change: Yes Duration of the  Pain. Constant / Intermittento Constant Rate the pain. Current Pain Level: 8 Worst Pain Level: 10 Least Pain Level: 6 Tolerable Pain Level: 7 Character of Pain Describe the Pain: Throbbing Pain Management and Medication Current Pain Management: Medication: Yes Cold Application: No Rest: Yes Massage: No Activity: No T.E.N.S.: No Heat Application: No Leg drop or elevation: No Is the Current Pain Management Adequate: Adequate How does your wound impact your activities of daily livingo Sleep: No Bathing: No Appetite: No Relationship With Others: No Bladder Continence: No Emotions: No Bowel Continence: No Work: No Toileting: No Drive: No Dressing: No Hobbies: No Electronic Signature(s) Signed: 12/17/2021 5:27:01 PM By: Dellie Catholic RN Entered By: Dellie Catholic on 12/17/2021 12:41:29 Amodei, Tobias Alexander ([id number]) [id number] Page 6 of 9 -------------------------------------------------------------------------------- Patient/Caregiver Education Details Patient Name: Date of Service: Jannifer Rodney 10/24/2023andnbsp12:30 PM Medical Record Number: [id number] Patient Account Number: [id number] Date of Birth/Gender: Treating RN: 10/15/1940 (81 y.o. America Brown Primary Care Physician: [name] Other Clinician: Referring Physician: Treating Physician/Extender: Latanya Maudlin in Treatment: 8 Education Assessment Education Provided To: Patient Education Topics Provided Wound/Skin Impairment: Methods: Explain/Verbal Responses: Return demonstration correctly Electronic Signature(s) Signed: 12/17/2021 5:27:01 PM By: Dellie Catholic RN Entered By: Dellie Catholic on 12/17/2021 17:24:32 -------------------------------------------------------------------------------- Wound Assessment Details Patient Name: Date of Service: [name], [name] 12/17/2021 12:30 PM Medical Record Number:  [id number] Patient Account Number: [id number] Date of Birth/Sex: Treating RN: 1940-11-24 (81 y.o. America Brown Primary Care Darious Rehman: [name] Other Clinician: Referring Kahari Critzer: Treating Drishti Pepperman/Extender: Latanya Maudlin in Treatment: 8 Wound Status Wound Number: 1 Primary Lymphedema Etiology: Wound Location: Right, Circumferential Lower Leg Wound Open Wounding Event: Gradually Appeared Status: Date Acquired: 07/29/2021 Comorbid Cataracts, Lymphedema, Arrhythmia, Hypertension, Peripheral Weeks Of Treatment: 8 History: Venous Disease, Type II Diabetes, Osteoarthritis, Neuropathy Clustered Wound: No Photos Wound Measurements Length: (cm) 13 Width: (cm) 29 Depth: (cm) 0.1 Area: (cm) 296.095 Volume: (cm) 29.61 % Reduction in Area: -81.2% % Reduction in Volume: 9.4% Epithelialization: Small (1-33%) Tunneling: No Undermining: No Wound Description Blomberg, Ethell L ([id number]) Classification: Full Thickness Without Exposed Support Structures Wound Margin: Distinct, outline attached Exudate Amount: Medium Exudate Type: Serous Exudate Color: amber [id number] Page 7 of 9 Foul Odor After Cleansing: No Slough/Fibrino No Wound Bed Granulation Amount: Large (67-100%) Exposed Structure Granulation Quality: Red, Pink Fascia Exposed: No Necrotic Amount: None Present (0%) Fat Layer (Subcutaneous Tissue) Exposed: Yes Tendon Exposed: No Muscle Exposed: No Joint Exposed: No Bone Exposed: No Periwound Skin Texture Texture Color No Abnormalities Noted: Yes No Abnormalities Noted: No Atrophie Blanche: No Moisture Cyanosis: No No Abnormalities Noted: Yes Ecchymosis: No Erythema: No Hemosiderin Staining: Yes Mottled: No Pallor: No Rubor: No Temperature / Pain Temperature: No Abnormality Treatment Notes Wound #1 (Lower Leg) Wound Laterality: Right, Circumferential Cleanser Soap and Water Discharge  Instruction: May shower and wash wound with dial antibacterial soap and water prior to dressing change. Wound Cleanser Discharge Instruction: Cleanse the wound with wound cleanser prior to applying a clean dressing using gauze sponges, not tissue or cotton balls. Peri-Wound Care Zinc Oxide Ointment 30g tube Discharge Instruction: Apply Zinc Oxide to  periwound with each dressing change Sween Lotion (Moisturizing lotion) Discharge Instruction: Apply moisturizing lotion as directed Topical Compounded Antibiotic Discharge Instruction: Apply to reddened areas Primary Dressing KerraCel Ag Gelling Fiber Dressing, 4x5 in (silver alginate) Discharge Instruction: Apply silver alginate to wound bed as instructed Secondary Dressing ABD Pad, 8x10 Discharge Instruction: Apply over primary dressing as directed. Zetuvit Plus 4x8 in Discharge Instruction: Apply over primary dressing as directed. Secured With Compression Wrap Kerlix Roll 4.5x3.1 (in/yd) Discharge Instruction: Apply Kerlix and Coban compression as directed. Coban Self-Adherent Wrap 4x5 (in/yd) Discharge Instruction: Apply over Kerlix as directed. Compression Stockings Add-Ons Electronic Signature(s) Signed: 12/17/2021 4:40:39 PM By: Sharyn Creamer RN, BSN Reiswig, Tobias Alexander ([id number]) [id number] Page 8 of 9 Signed: 12/17/2021 5:27:01 PM By: Dellie Catholic RN Entered By: Sharyn Creamer on 12/17/2021 12:59:14 -------------------------------------------------------------------------------- Wound Assessment Details Patient Name: Date of Service: [name], [name] 12/17/2021 12:30 PM Medical Record Number: [id number] Patient Account Number: [id number] Date of Birth/Sex: Treating RN: 1940-08-04 (81 y.o. America Brown Primary Care Shantae Vantol: [name] Other Clinician: Referring Jakayla Schweppe: Treating Aiyanna Awtrey/Extender: Latanya Maudlin in Treatment: 8 Wound Status Wound  Number: 2 Primary Lymphedema Etiology: Wound Location: Right, Medial Upper Leg Wound Open Wounding Event: Blister Status: Date Acquired: 12/17/2021 Comorbid Cataracts, Lymphedema, Arrhythmia, Hypertension, Peripheral Weeks Of Treatment: 0 History: Venous Disease, Type II Diabetes, Osteoarthritis, Neuropathy Clustered Wound: No Photos Wound Measurements Length: (cm) 4.5 Width: (cm) 5 Depth: (cm) 0.1 Area: (cm) 17.671 Volume: (cm) 1.767 % Reduction in Area: % Reduction in Volume: Epithelialization: None Tunneling: No Undermining: No Wound Description Classification: Full Thickness Without Exposed Support Structures Wound Margin: Flat and Intact Exudate Amount: Large Exudate Type: Serous Exudate Color: amber Foul Odor After Cleansing: No Slough/Fibrino No Wound Bed Granulation Amount: Large (67-100%) Exposed Structure Granulation Quality: Red Fascia Exposed: No Necrotic Amount: None Present (0%) Fat Layer (Subcutaneous Tissue) Exposed: Yes Tendon Exposed: No Muscle Exposed: No Joint Exposed: No Bone Exposed: No Periwound Skin Texture Texture Color No Abnormalities Noted: No No Abnormalities Noted: No Erythema: Yes Moisture Erythema Location: Circumferential No Abnormalities Noted: No Treatment Notes Flenniken, Malea L ([id number]) [id number] Page 9 of 9 Wound #2 (Upper Leg) Wound Laterality: Right, Medial Cleanser Soap and Water Discharge Instruction: May shower and wash wound with dial antibacterial soap and water prior to dressing change. Wound Cleanser Discharge Instruction: Cleanse the wound with wound cleanser prior to applying a clean dressing using gauze sponges, not tissue or cotton balls. Peri-Wound Care Zinc Oxide Ointment 30g tube Discharge Instruction: Apply Zinc Oxide to periwound with each dressing change Sween Lotion (Moisturizing lotion) Discharge Instruction: Apply moisturizing lotion as  directed Topical Compounded Antibiotic Discharge Instruction: Apply to reddened areas Primary Dressing KerraCel Ag Gelling Fiber Dressing, 4x5 in (silver alginate) Discharge Instruction: Apply silver alginate to wound bed as instructed Secondary Dressing ABD Pad, 8x10 Discharge Instruction: Apply over primary dressing as directed. Zetuvit Plus 4x8 in Discharge Instruction: Apply over primary dressing as directed. Secured With Compression Wrap Kerlix Roll 4.5x3.1 (in/yd) Discharge Instruction: Apply Kerlix and Coban compression as directed. Compression Stockings Add-Ons Electronic Signature(s) Signed: 12/17/2021 4:40:39 PM By: Sharyn Creamer RN, BSN Signed: 12/17/2021 5:27:01 PM By: Dellie Catholic RN Entered By: Sharyn Creamer on 12/17/2021 12:59:44 -------------------------------------------------------------------------------- Vitals Details Patient Name: Date of Service: [name], [name] 12/17/2021 12:30 PM Medical Record Number: [id number] Patient Account Number: [id number] Date of Birth/Sex: Treating RN: Sep 25, 1940 (81 y.o. America Brown Primary Care Lunden Mcleish: [name] Other Clinician: Referring  Sabra Sessler: Treating Evanthia Maund/Extender: Latanya Maudlin in Treatment: 8 Vital Signs Time Taken: 12:42 Temperature (F): 98.3 Height (in): 62 Pulse (bpm): 71 Weight (lbs): 298 Respiratory Rate (breaths/min): 18 Body Mass Index (BMI): 54.5 Blood Pressure (mmHg): 136/65 Reference Range: 80 - 120 mg / dl Electronic Signature(s) Signed: 12/17/2021 5:27:01 PM By: Dellie Catholic RN Entered By: Dellie Catholic on 12/17/2021 12:59:39

## 2021-12-17 NOTE — Progress Notes (Signed)
[name] Bridges, [name] Bridges ([id number]) [id number] Page 1 of 11 Visit Report for 12/17/2021 Chief Complaint Document Details Patient Name: Date of Service: [name] Bridges, [place] [name] Bridges. 12/17/2021 12:30 PM Medical Record Number: [id number] Patient Account Number: [id number] Date of Birth/Sex: Treating RN: 1940/09/15 (81 y.o. F) Primary Care Provider: [name] Other Clinician: Referring Provider: Treating Provider/Extender: Latanya Maudlin in Treatment: 8 Information Obtained from: Patient Chief Complaint 10/21/2021; right lower extremity wound Electronic Signature(s) Signed: 12/17/2021 1:18:21 PM By: Fredirick Maudlin MD FACS Entered By: Fredirick Maudlin on 12/17/2021 13:18:21 -------------------------------------------------------------------------------- Debridement Details Patient Name: Date of Service: [name] Bridges, [place] [name] Bridges. 12/17/2021 12:30 PM Medical Record Number: [id number] Patient Account Number: [id number] Date of Birth/Sex: Treating RN: 1940/12/15 (81 y.o. America Brown Primary Care Provider: [name] Other Clinician: Referring Provider: Treating Provider/Extender: Latanya Maudlin in Treatment: 8 Debridement Performed for Assessment: Wound #2 Right,Medial Upper Leg Performed By: Physician Fredirick Maudlin, MD Debridement Type: Debridement Level of Consciousness (Pre-procedure): Awake and Alert Pre-procedure Verification/Time Out Yes - 13:08 Taken: Start Time: 13:08 Pain Control: Lidocaine 4% T opical Solution T Area Debrided (Bridges x W): otal 4.5 (cm) x 5 (cm) = 22.5 (cm) Tissue and other material debrided: Non-Viable, Eschar, Slough, Slough Level: Non-Viable Tissue Debridement Description: Selective/Open Wound Instrument: Curette Bleeding: Minimum Hemostasis Achieved: Pressure End Time: 13:09 Procedural Pain: 0 Post Procedural Pain: 0 Response to Treatment: Procedure was tolerated  well Level of Consciousness (Post- Awake and Alert procedure): Post Debridement Measurements of Total Wound Length: (cm) 4.5 Width: (cm) 5 Depth: (cm) 0.1 Volume: (cm) 1.767 Character of Wound/Ulcer Post Debridement: Improved Post Procedure Diagnosis [name] Bridges, [name] Bridges ([id number]) [id number] Page 2 of 11 Same as Pre-procedure Notes Scribed for Dr. Celine Ahr by J.Scotton Electronic Signature(s) Signed: 12/17/2021 2:34:04 PM By: Fredirick Maudlin MD FACS Signed: 12/17/2021 5:27:01 PM By: Dellie Catholic RN Entered By: Dellie Catholic on 12/17/2021 13:19:25 -------------------------------------------------------------------------------- Debridement Details Patient Name: Date of Service: [name] Bridges, [place] [name] Bridges. 12/17/2021 12:30 PM Medical Record Number: [id number] Patient Account Number: [id number] Date of Birth/Sex: Treating RN: 1941/01/22 (81 y.o. America Brown Primary Care Provider: [name] Other Clinician: Referring Provider: Treating Provider/Extender: Latanya Maudlin in Treatment: 8 Debridement Performed for Assessment: Wound #1 Right,Circumferential Lower Leg Performed By: Physician Fredirick Maudlin, MD Debridement Type: Debridement Level of Consciousness (Pre-procedure): Awake and Alert Pre-procedure Verification/Time Out Yes - 13:08 Taken: Start Time: 13:08 Pain Control: Lidocaine 4% T opical Solution T Area Debrided (Bridges x W): otal 13 (cm) x 29 (cm) = 377 (cm) Tissue and other material debrided: Non-Viable, Eschar, Slough, Slough Level: Non-Viable Tissue Debridement Description: Selective/Open Wound Instrument: Curette Bleeding: Minimum Hemostasis Achieved: Pressure End Time: 13:09 Procedural Pain: 0 Post Procedural Pain: 0 Response to Treatment: Procedure was tolerated well Level of Consciousness (Post- Awake and Alert procedure): Post Debridement Measurements of Total Wound Length: (cm) 13 Width:  (cm) 29 Depth: (cm) 0.1 Volume: (cm) 29.61 Character of Wound/Ulcer Post Debridement: Improved Post Procedure Diagnosis Same as Pre-procedure Notes Scribed for Dr. Celine Ahr by J.Scotton Electronic Signature(s) Signed: 12/17/2021 2:34:04 PM By: Fredirick Maudlin MD FACS Signed: 12/17/2021 5:27:01 PM By: Dellie Catholic RN Entered By: Dellie Catholic on 12/17/2021 13:20:42 [name] Bridges, [name] Bridges ([id number]) [id number] Page 3 of 11 -------------------------------------------------------------------------------- HPI Details Patient Name: Date of ServiceRush Bridges, [place] [name] Bridges. 12/17/2021 12:30 PM Medical Record Number: [id number] Patient Account Number: [id number] Date of Birth/Sex: Treating RN: 1940/08/02 (81 y.o. F) Primary Care Provider: Melford Aase,  Legrand Como Other Clinician: Referring Provider: Treating Provider/Extender: Latanya Maudlin in Treatment: 8 History of Present Illness HPI Description: Admission 10/21/2021 [name] Bridges is an 81 year old female with a past medical history of morbid obesity, insulin-dependent type 2 diabetes, atrial fibrillation on Eliquis and lymphedema/venous insufficiency that presents to the clinic for a right lower extremity wound that has been present for the past 2 months. She has a history of wounds that wax and wane in healing over the past 2 years. She has been following with [facility] for this issue. They have been using Kerlix and Ace bandage along with silver alginate. She is also on torsemide 40 mg daily. She has been taking this dose for the past 5 years. She states she has a lot of drainage from the wound beds and often requires several dressing changes throughout the day. She has home health that comes out twice weekly. She has lymphedema pumps that she uses twice daily. Currently she denies signs of infection. 9/5; patient presents for follow-up. We have been using PolyMem silver under  Kerlix/Coban. She states she tolerated the compression wrap well but felt like it was very tight. Home health has been coming out however they did not have any supplies for the patient and has been using ABD pads with an Ace bandage. She was able to follow-up last week for nurse visit. She is able to make it 24 hours without drainage coming completely through the wrap/ace bandage. She denies signs of infection. 9/12; patient presents for follow-up. We have been using PolyMem silver under Kerlix/Coban. She reports less drainage from the wound bed and can last almost 2 days without a wrap change. 9/19; patient presents for follow-up. We have been using PolyMem silver under Kerlix/Coban. She tried ketoconazole and it improved her symptoms of itching however she still has significant erythema on exam. 9/26; patient presents for follow-up. We have been using PolyMem silver with ketoconazole under Kerlix/Coban. She has no issues with the wrap today. She states she has had this changed every other day by home health. I gave her cephalexin at last clinic visit however she states she broke out in hive. She immediately stopped the medication and had no further issues. She has had cephalosporins in the past without issues. 10/3; patient presents for follow-up. She had a wound culture done at last clinic visit that showed extra high levels of Serratia marcescens, Pseudomonas aeruginosa, Morganella morganii I, Enterococcus faecalis. Keystone antibiotic was ordered. Patient has not been contacted for purchase yet. We have been using calcium alginate under Kerlix/Coban as she does not tolerate a higher compression wrap. 10/10; patient presents for follow-up. She has been using Keystone antibiotics and has done this for the past 3 dressing changes. She reports improvement in drainage and a decrease in rednessaround the periwound. We have also been using silver alginate under Kerlix/Coban. 10/17; patient presents for  follow-up. She has been using Keystone topical antibiotic spray with silver alginate under Kerlix/Coban. She reports improvement in wound healing. She reports a decrease in drainage and her wrap not having to be changed as often. 12/17/2021: Unfortunately, she had a significant increase in drainage that saturated her dressings and skin, despite frequent changes. She also had breakdown of the skin on her upper medial thigh. Her skin is quite erythematous and she has not been able to use her lymphedema pumps on the right due to pain. Electronic Signature(s) Signed: 12/17/2021 1:19:31 PM By: Fredirick Maudlin MD FACS Entered By:  Fredirick Maudlin on 12/17/2021 13:19:30 -------------------------------------------------------------------------------- Physical Exam Details Patient Name: Date of Service: [name] Bridges, [place] [name] Bridges. 12/17/2021 12:30 PM Medical Record Number: [id number] Patient Account Number: [id number] Date of Birth/Sex: Treating RN: Jun 24, 1940 (81 y.o. F) Primary Care Provider: [name] Other Clinician: Referring Provider: Treating Provider/Extender: Latanya Maudlin in Treatment: 8 Constitutional . . . . No acute distress.Marland Kitchen Respiratory Normal work of breathing on room air.. Cardiovascular 3+ pitting edema bilaterally, changes consistent with lymphedema and chronic venous stasis.. [name] Bridges, [name] Bridges ([id number]) [id number] Page 4 of 11 12/17/2021: The leg is erythematous from the midfoot to the mid thigh. She has new skin breakdown on her medial right thigh. The existing wounds are superficial without much accumulation of slough. Electronic Signature(s) Signed: 12/17/2021 1:21:02 PM By: Fredirick Maudlin MD FACS Entered By: Fredirick Maudlin on 12/17/2021 13:21:02 -------------------------------------------------------------------------------- Physician Orders Details Patient Name: Date of Service: [name] Bridges, [place] [name] Bridges.  12/17/2021 12:30 PM Medical Record Number: [id number] Patient Account Number: [id number] Date of Birth/Sex: Treating RN: 03-20-1940 (81 y.o. 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 - Dr. Heber Cooper Room 7 Return A Anesthetic (In clinic) Topical Lidocaine 5% applied to wound bed (In clinic) Topical Lidocaine 4% applied to wound bed Bathing/ Shower/ Hygiene May shower with protection but do not get wound dressing(s) wet. - Can shower with cast protector bag from [facility], [facility] or [place]. Edema Control - Lymphedema / SCD / Other Lymphedema Pumps. Use Lymphedema pumps on leg(s) 2-3 times a day for 45-60 minutes. If wearing any wraps or hose, do not remove them. Continue exercising as instructed. Elevate legs to the level of the heart or above for 30 minutes daily and/or when sitting, a frequency of: - throughout the day Avoid standing for long periods of time. Additional Orders / Instructions Follow Nutritious Diet - Monitor/Control Blood Sugar Home Health No change in wound care orders this week; continue Home Health for wound care. May utilize formulary equivalent dressing for wound treatment orders unless otherwise specified. Dressing changes to be completed by [facility] on Monday / Wednesday / Friday  except when patient has scheduled visit at [facility] to come 3x week Other Home Health Orders/Instructions: [name]: Fax [phone] Wound Treatment Wound #1 - Lower Leg Wound Laterality: Right, Circumferential Cleanser: Soap and Water ([place]) 1 x Per Day/30 Days Discharge Instructions: May shower and wash wound with dial antibacterial soap and water prior to dressing change. Cleanser: Wound Cleanser (Home Health) 1 x Per Day/30 Days Discharge Instructions: Cleanse the wound with wound cleanser prior to applying a clean dressing using gauze sponges, not tissue or cotton balls. Peri-Wound Care: Zinc Oxide Ointment 30g tube (Home Health) 1 x Per Day/30 Days Discharge Instructions: Apply Zinc Oxide to periwound with each dressing change Peri-Wound Care: Sween Lotion (Moisturizing lotion) (Home Health) 1 x Per Day/30 Days Discharge Instructions: Apply moisturizing lotion as directed  [name] Bridges, [name] Bridges ([id number]) [id number] Page 5 of 11 Topical: Compounded Antibiotic 1 x Per Day/30 Days Discharge Instructions: Apply to reddened areas Prim Dressing: KerraCel Ag Gelling Fiber Dressing, 4x5 in (silver alginate) (Home Health) 1 x Per Day/30 Days ary Discharge Instructions: Apply silver alginate to wound bed as instructed Secondary Dressing: ABD Pad, 8x10 (Home Health) 1 x Per Day/30 Days Discharge Instructions: Apply over primary dressing as directed. Secondary Dressing: Zetuvit Plus 4x8 in [facility]) 1 x Per Day/30 Days Discharge Instructions: Apply over primary dressing as directed. Compression Wrap: Kerlix Roll 4.5x3.1 (in/yd) (Home Health) 1 x Per Day/30 Days Discharge Instructions: Apply Kerlix and Coban compression as directed. Compression Wrap: Coban Self-Adherent Wrap 4x5 (in/yd) (Home Health) 1 x Per Day/30 Days Discharge Instructions: Apply over Kerlix as directed. Wound #2 - Upper Leg Wound Laterality: Right, Medial Cleanser:  Soap and Water (Home Health) 1 x Per Day/30 Days Discharge Instructions: May shower and wash wound with dial antibacterial soap and water prior to dressing change. Cleanser: Wound Cleanser (Home Health) 1 x Per Day/30 Days Discharge Instructions: Cleanse the wound with wound cleanser prior to applying a clean dressing using gauze sponges, not tissue or cotton balls. Peri-Wound Care: Zinc Oxide Ointment 30g tube (Home Health) 1 x Per Day/30 Days Discharge Instructions: Apply Zinc Oxide to periwound with each dressing change Peri-Wound Care: Sween Lotion (Moisturizing lotion) (Home Health) 1 x Per Day/30 Days Discharge Instructions: Apply moisturizing lotion as directed Topical: Compounded Antibiotic 1 x Per Day/30 Days Discharge Instructions: Apply to reddened areas Prim Dressing: KerraCel Ag Gelling Fiber Dressing, 4x5 in (silver alginate) (Home Health) 1 x Per Day/30 Days ary Discharge Instructions: Apply silver alginate to wound bed as instructed Secondary Dressing: ABD Pad, 8x10 (Home Health) 1 x Per Day/30 Days Discharge Instructions: Apply over primary dressing as directed. Secondary Dressing: Zetuvit Plus 4x8 in [facility]) 1 x Per Day/30 Days Discharge Instructions: Apply over primary dressing as directed. Compression Wrap: Kerlix Roll 4.5x3.1 (in/yd) 1 x Per Day/30 Days Discharge Instructions: Apply Kerlix and Coban compression as directed. Patient Medications llergies: clindamycin, codeine, gabapentin, Statins-HMG-CoA Reductase Inhibitors, sulfamethoxazole A Notifications Medication Indication Start End 12/17/2021 levofloxacin DOSE oral 750 mg tablet - 1 tab p.o. daily x7 days; separate from calcium or other vitamin supplements by at least 4 hours Electronic Signature(s) Signed: 12/17/2021 2:34:04 PM By: Fredirick Maudlin MD FACS Previous Signature: 12/17/2021 1:24:22 PM Version By: Fredirick Maudlin MD FACS Previous Signature: 12/17/2021 1:21:30 PM Version By: Fredirick Maudlin  MD FACS Entered By: Fredirick Maudlin on 12/17/2021 13:24:33 -------------------------------------------------------------------------------- Problem List Details Patient Name: Date of Service: [name] Bridges, [place] [name] Bridges. 12/17/2021 12:30 PM Medical Record Number: [id number] Patient Account Number: [id number] MUREL, WIGLE ([id number]) [id number] Page 6 of 11 Date of Birth/Sex: Treating RN: December 17, 1940 (81 y.o. F) Primary Care Provider: Other Clinician: [name] Referring Provider: Treating Provider/Extender: Latanya Maudlin in Treatment: 8 Active Problems ICD-10 Encounter Code Description Active Date MDM Diagnosis L97.812 Non-pressure chronic ulcer of other part of right lower leg with fat layer 10/21/2021 No Yes exposed I87.311 Chronic venous hypertension (idiopathic) with ulcer of right lower extremity 10/21/2021 No Yes E11.622 Type 2 diabetes mellitus with other skin ulcer 10/21/2021 No Yes I48.91 Unspecified atrial fibrillation 10/21/2021 No Yes Z79.01 Long term (current) use of anticoagulants 10/21/2021 No Yes B37.2 Candidiasis of skin and nail 11/05/2021 No Yes L97.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 12/17/2021 12:30 PM Medical Record Number: [id number] Patient Account Number: [id number] Date of Birth/Sex: Treating RN: October 03, 1940 (81 y.o. F) Primary Care Provider: [name] Other Clinician: Referring Provider: Treating Provider/Extender: Latanya Maudlin in Treatment: 8 Subjective Chief Complaint Information  obtained from Patient 10/21/2021; right lower extremity wound History of Present Illness (HPI) Admission 10/21/2021 Ms. Daisja Kessinger is an 81 year old female with a past medical history of morbid obesity, insulin-dependent type 2 diabetes, atrial fibrillation on Eliquis and LEKISHA, MCGHEE ([id number]) [id number] Page 7 of 11 lymphedema/venous insufficiency that presents to the clinic for a right lower extremity wound that has been present for the past 2 months. She has a history of wounds that wax and wane in healing over the past 2 years. She has been following with [facility] for this issue. They have been using Kerlix and Ace bandage along with silver alginate. She is also on torsemide 40 mg daily. She has been taking this dose for the past 5 years. She states she has a lot of drainage from the wound beds and often requires several dressing changes throughout the day. She has home health that comes out twice weekly. She has lymphedema pumps that she uses twice daily. Currently she denies signs of infection. 9/5; patient presents for follow-up. We have been using PolyMem silver under Kerlix/Coban. She states she tolerated the compression wrap well but felt like it was very tight. Home health has been coming out however they did not have any supplies for the patient and has been using ABD pads with an Ace bandage. She was able to follow-up last week for nurse visit. She is able to make it 24 hours without drainage coming completely through the wrap/ace bandage. She denies signs of infection. 9/12; patient presents for follow-up. We have been using PolyMem silver under Kerlix/Coban. She reports less drainage from the wound bed and can last almost 2 days without a wrap change. 9/19; patient presents for follow-up. We have been using PolyMem silver under Kerlix/Coban. She tried ketoconazole and it improved her symptoms of itching however she still has  significant erythema on exam. 9/26; patient presents for follow-up. We have been using PolyMem silver with ketoconazole under Kerlix/Coban. She has no issues with the wrap today. She states she has had this changed every other day by home health. I gave her cephalexin at last clinic visit however she states she broke out in hive. She immediately stopped the medication and had no further issues. She has had cephalosporins in the past without issues. 10/3; patient presents for follow-up. She had a wound culture done at last clinic visit that showed extra high levels of Serratia marcescens, Pseudomonas aeruginosa, Morganella morganii I, Enterococcus faecalis. Keystone antibiotic was ordered. Patient has not been contacted for purchase yet. We have been using calcium alginate under Kerlix/Coban as she does not tolerate a higher compression wrap. 10/10; patient presents for follow-up. She has been using Keystone antibiotics and has done this for the past 3 dressing changes. She reports improvement in drainage and a decrease in rednessaround the periwound. We have also been using silver alginate under Kerlix/Coban. 10/17; patient presents for follow-up. She has been using Keystone topical antibiotic spray with silver alginate under Kerlix/Coban. She reports improvement in wound healing. She reports a decrease  in drainage and her wrap not having to be changed as often. 12/17/2021: Unfortunately, she had a significant increase in drainage that saturated her dressings and skin, despite frequent changes. She also had breakdown of the skin on her upper medial thigh. Her skin is quite erythematous and she has not been able to use her lymphedema pumps on the right due to pain. Patient History Information obtained from Patient. Family History Diabetes - Siblings,Mother, Heart Disease - Mother, Hypertension - Mother, Kidney Disease - Mother, Stroke - Father. Social History Never smoker, Marital Status - Married,  Alcohol Use - Never, Drug Use - No History, Caffeine Use - Rarely. Medical History Eyes Patient has history of Cataracts Hematologic/Lymphatic Patient has history of Lymphedema Cardiovascular Patient has history of Arrhythmia - A fib, Hypertension, Peripheral Venous Disease Endocrine Patient has history of Type II Diabetes Musculoskeletal Patient has history of Osteoarthritis Neurologic Patient has history of Neuropathy Medical A Surgical History Notes nd Genitourinary CKD Objective Constitutional No acute distress.. Vitals Time Taken: 12:42 PM, Height: 62 in, Weight: 298 lbs, BMI: 54.5, Temperature: 98.3 F, Pulse: 71 bpm, Respiratory Rate: 18 breaths/min, Blood Pressure: 136/65 mmHg. Respiratory Normal work of breathing on room air.. Cardiovascular 3+ pitting edema bilaterally, changes consistent with lymphedema and chronic venous stasis.. General Notes: 12/17/2021: The leg is erythematous from the midfoot to the mid thigh. She has new skin breakdown on her medial right thigh. The existing wounds are superficial without much accumulation of slough. Integumentary (Hair, Skin) [name] Bridges, [name] Bridges ([id number]) [id number] Page 8 of 11 Wound #1 status is Open. Original cause of wound was Gradually Appeared. The date acquired was: 07/29/2021. The wound has been in treatment 8 weeks. The wound is located on the Right,Circumferential Lower Leg. The wound measures 13cm length x 29cm width x 0.1cm depth; 296.095cm^2 area and 29.61cm^3 volume. There is Fat Layer (Subcutaneous Tissue) exposed. There is no tunneling or undermining noted. There is a medium amount of serous drainage noted. The wound margin is distinct with the outline attached to the wound base. There is large (67-100%) red, pink granulation within the wound bed. There is no necrotic tissue within the wound bed. The periwound skin appearance had no abnormalities noted for texture. The periwound skin  appearance had no abnormalities noted for moisture. The periwound skin appearance exhibited: Hemosiderin Staining. The periwound skin appearance did not exhibit: Atrophie Blanche, Cyanosis, Ecchymosis, Mottled, Pallor, Rubor, Erythema. Periwound temperature was noted as No Abnormality. Wound #2 status is Open. Original cause of wound was Blister. The date acquired was: 12/17/2021. The wound is located on the Right,Medial Upper Leg. The wound measures 4.5cm length x 5cm width x 0.1cm depth; 17.671cm^2 area and 1.767cm^3 volume. There is Fat Layer (Subcutaneous Tissue) exposed. There is no tunneling or undermining noted. There is a large amount of serous drainage noted. The wound margin is flat and intact. There is large (67-100%) red granulation within the wound bed. There is no necrotic tissue within the wound bed. The periwound skin appearance exhibited: Erythema. The surrounding wound skin color is noted with erythema which is circumferential. 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 There was a Selective/Open Wound Non-Viable Tissue Debridement with a total area of 377 sq cm performed by Fredirick Maudlin, MD. With the following instrument(s): Curette to remove Non-Viable tissue/material. Material removed includes Eschar and Slough and after achieving pain control using Lidocaine 4% T opical Solution. No specimens were taken. A time out was conducted at 13:08, prior to the start of the procedure. A Minimum  amount of bleeding was controlled with Pressure. The procedure was tolerated well with a pain level of 0 throughout and a pain level of 0 following the procedure. Post Debridement Measurements: 13cm length x 29cm width x 0.1cm depth; 29.61cm^3 volume. Character of Wound/Ulcer Post Debridement is improved. Post procedure Diagnosis Wound #1: Same as Pre-Procedure General Notes: Scribed for Dr. Celine Ahr by J.Scotton. Wound #2 Pre-procedure diagnosis of Wound #2 is a Lymphedema located on the Right,Medial Upper Leg . There was a Selective/Open Wound Non-Viable Tissue Debridement with a total area of 22.5 sq cm performed by Fredirick Maudlin, MD. With the following instrument(s): Curette to remove Non-Viable tissue/material. Material removed includes Eschar and Slough and after achieving pain control using Lidocaine 4% T opical Solution. No specimens were taken. A time out was conducted at 13:08, prior to the start of the procedure. A Minimum amount of bleeding was controlled with Pressure. The procedure was tolerated well with a pain level of 0 throughout and a pain level of 0 following the procedure. Post Debridement Measurements: 4.5cm length x 5cm width x 0.1cm depth; 1.767cm^3 volume. Character of Wound/Ulcer Post Debridement is improved. Post procedure Diagnosis Wound #2: Same as Pre-Procedure General Notes: Scribed for Dr. Celine Ahr by J.Scotton. Plan Follow-up Appointments: Return Appointment in 1 week. - Dr. Heber Bu[place] Room 7 Anesthetic: (In clinic) Topical Lidocaine 5% applied to wound bed (In clinic) Topical Lidocaine 4% applied to wound bed Bathing/ Shower/ Hygiene: May shower with protection but do not get wound dressing(s) wet. - Can shower with cast protector bag from [facility], [facility] or [facility]. Edema Control - Lymphedema / SCD / Other: Lymphedema Pumps. Use Lymphedema pumps on leg(s) 2-3 times a day for 45-60 minutes. If wearing any wraps or hose, do not remove them. Continue exercising as  instructed. Elevate legs to the level of the heart or above for 30 minutes daily and/or when sitting, a frequency of: - throughout the day Avoid standing for long periods of time. Additional Orders / Instructions: Follow Nutritious Diet - Monitor/Control Blood Sugar Home Health: No change in wound care orders this week; continue Home Health for wound care. May utilize formulary equivalent dressing for wound treatment orders unless otherwise specified. Dressing changes to be completed by [facility] on Monday / Wednesday / Friday except when patient has scheduled visit at [facility] to come 3x week Other Home Health Orders/Instructions: [name]: Fax [phone] The following medication(s) was prescribed: levofloxacin oral 750 mg tablet 1 tab p.o. daily x7 days; separate from calcium or other vitamin supplements by at least 4 hours starting 12/17/2021 [name] Bridges, [name] Bridges ([id number]) [id number] Page 9 of 11 WOUND #1: - Lower Leg Wound Laterality: Right, Circumferential Cleanser: Soap and Water (Home Health) 1 x Per Day/30 Days Discharge Instructions: May shower and wash wound with dial antibacterial soap and water prior to dressing change. Cleanser: Wound Cleanser (Home Health) 1 x Per Day/30 Days Discharge Instructions: Cleanse the wound with wound cleanser prior to applying a clean dressing using gauze sponges, not tissue or cotton balls.  Peri-Wound Care: Zinc Oxide Ointment 30g tube (Home Health) 1 x Per Day/30 Days Discharge Instructions: Apply Zinc Oxide to periwound with each dressing change Peri-Wound Care: Sween Lotion (Moisturizing lotion) (Home Health) 1 x Per Day/30 Days Discharge Instructions: Apply moisturizing lotion as directed Topical: Compounded Antibiotic 1 x Per Day/30 Days Discharge Instructions: Apply to reddened areas Prim Dressing: KerraCel Ag Gelling Fiber Dressing, 4x5 in (silver alginate) (Home Health) 1 x Per Day/30  Days ary Discharge Instructions: Apply silver alginate to wound bed as instructed Secondary Dressing: ABD Pad, 8x10 (Home Health) 1 x Per Day/30 Days Discharge Instructions: Apply over primary dressing as directed. Secondary Dressing: Zetuvit Plus 4x8 in [facility]) 1 x Per Day/30 Days Discharge Instructions: Apply over primary dressing as directed. Com pression Wrap: Kerlix Roll 4.5x3.1 (in/yd) (Home Health) 1 x Per Day/30 Days Discharge Instructions: Apply Kerlix and Coban compression as directed. Com pression Wrap: Coban Self-Adherent Wrap 4x5 (in/yd) (Home Health) 1 x Per Day/30 Days Discharge Instructions: Apply over Kerlix as directed. WOUND #2: - Upper Leg Wound Laterality: Right, Medial Cleanser: Soap and Water (Home Health) 1 x Per Day/30 Days Discharge Instructions: May shower and wash wound with dial antibacterial soap and water prior to dressing change. Cleanser: Wound Cleanser (Home Health) 1 x Per Day/30 Days Discharge Instructions: Cleanse the wound with wound cleanser prior to applying a clean dressing using gauze sponges, not tissue or cotton balls. Peri-Wound Care: Zinc Oxide Ointment 30g tube (Home Health) 1 x Per Day/30 Days Discharge Instructions: Apply Zinc Oxide to periwound with each dressing change Peri-Wound Care: Sween Lotion (Moisturizing lotion) (Home Health) 1 x Per Day/30 Days Discharge Instructions: Apply moisturizing lotion as directed Topical: Compounded Antibiotic 1 x Per Day/30 Days Discharge Instructions: Apply to reddened areas Prim Dressing: KerraCel Ag Gelling Fiber Dressing, 4x5 in (silver alginate) (Home Health) 1 x Per Day/30 Days ary Discharge Instructions: Apply silver alginate to wound bed as instructed Secondary Dressing: ABD Pad, 8x10 (Home Health) 1 x Per Day/30 Days Discharge Instructions: Apply over primary dressing as directed. Secondary Dressing: Zetuvit Plus 4x8 in [facility]) 1 x Per Day/30 Days Discharge Instructions: Apply over  primary dressing as directed. Com pression Wrap: Kerlix Roll 4.5x3.1 (in/yd) 1 x Per Day/30 Days Discharge Instructions: Apply Kerlix and Coban compression as directed. 12/17/2021: Unfortunately, she has suffered a setback. The leg is erythematous from the midfoot to the mid thigh. She has new skin breakdown on her medial right thigh. The existing wounds are superficial without much accumulation of slough. I used a curette to debride slough and eschar from all of the open wound sites. We will continue to use her [employer] compounded topical antibiotic, but I think she also needs systemic treatment. Based upon the sensitivities and resistance genes identified on her culture, I am going to prescribe levofloxacin. Continue silver alginate and Kerlix/Coban wraps. We will need to engineer some manner of keeping the thigh wound dressing in place, perhaps using Tubigrip or netting. She will follow-up with Dr. Heber [place] next week. Electronic Signature(s) Signed: 12/17/2021 1:24:43 PM By: Fredirick Maudlin MD FACS Previous Signature: 12/17/2021 1:22:47 PM Version By: Fredirick Maudlin MD FACS Entered By: Fredirick Maudlin on 12/17/2021 13:24:43 -------------------------------------------------------------------------------- HxROS Details Patient Name: Date of Service: [name] Bridges, [place] [name] Bridges. 12/17/2021 12:30 PM Medical Record Number: [id number] Patient Account Number: [id number] Date of Birth/Sex: Treating RN: 1940/10/08 (81 y.o. F) Primary Care Provider: [name] Other Clinician: Referring Provider: Treating Provider/Extender: Latanya Maudlin in Treatment: 8  Information Obtained From Patient Eyes Medical History: Positive for: Cataracts EMILLIA, WEATHERLY ([id number]) [id number] Page 10 of 11 Hematologic/Lymphatic Medical History: Positive for: Lymphedema Cardiovascular Medical History: Positive for: Arrhythmia - A fib; Hypertension;  Peripheral Venous Disease Endocrine Medical History: Positive for: Type II Diabetes Time with diabetes: 30 years Treated with: Insulin Blood sugar tested every day: Yes Tested : 3x day Genitourinary Medical History: Past Medical History Notes: CKD Musculoskeletal Medical History: Positive for: Osteoarthritis Neurologic Medical History: Positive for: Neuropathy HBO Extended History Items Eyes: Cataracts Immunizations Pneumococcal Vaccine: Received Pneumococcal Vaccination: Yes Received Pneumococcal Vaccination On or After 60th Birthday: Yes Implantable Devices None Family and Social History Diabetes: Yes - Siblings,Mother; Heart Disease: Yes - Mother; Hypertension: Yes - Mother; Kidney Disease: Yes - Mother; Stroke: Yes - Father; Never smoker; Marital Status - Married; Alcohol Use: Never; Drug Use: No History; Caffeine Use: Rarely; Financial Concerns: No; Food, Clothing or Shelter Needs: No; Support System Lacking: No; Transportation Concerns: No Electronic Signature(s) Signed: 12/17/2021 2:34:04 PM By: Fredirick Maudlin MD FACS Entered By: Fredirick Maudlin on 12/17/2021 13:19:46 -------------------------------------------------------------------------------- SuperBill Details Patient Name: Date of Service: [name] Bridges, [place] [name] Bridges. 12/17/2021 Medical Record Number: [id number] Patient Account Number: [id number] Date of Birth/Sex: Treating RN: 12-23-1940 (81 y.o. F) Primary Care Provider: [name] Other Clinician: Referring Provider: Treating Provider/Extender: Latanya Maudlin in Treatment: 8 Diagnosis Coding Ela, Moffat [name] Bridges ([id number]) [id number] Page 11 of 11 ICD-10 Codes Code Description [phone] 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d I87.311 Chronic venous hypertension (idiopathic) with ulcer of right lower extremity E11.622 Type 2 diabetes mellitus with other skin  ulcer I48.91 Unspecified atrial fibrillation Z79.01 Long term (current) use of anticoagulants B37.2 Candidiasis of skin and nail L97.111 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 Facility Procedures : CPT4 Code: [id number] Description: 79024 - DEBRIDE WOUND 1ST 20 SQ CM OR < ICD-10 Diagnosis Description L97.812 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 L97.111 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 Modifier: d Quantity: 1 : CPT4 Code: [id number] Description: 92426 - DEBRIDE WOUND EA ADDL 20 SQ CM ICD-10 Diagnosis Description L97.812 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 L97.111 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 Modifier: d Quantity: 19 Physician Procedures : CPT4 Code Description Modifier [id number] - WC PHYS LEVEL 4 - EST PT 25 ICD-10 Diagnosis Description L97.812 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d L97.111 Non-pressure chronic ulcer of right thigh limited  to breakdown of skin I87.311 Chronic venous hypertension (idiopathic) with ulcer of right lower extremity E11.622 Type 2 diabetes mellitus with other skin ulcer Quantity: 1 : [id number] 17408 - WC PHYS DEBR WO ANESTH 20 SQ CM ICD-10 Diagnosis Description L97.812 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d L97.111 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

## 2021-12-24 ENCOUNTER — Encounter (HOSPITAL_BASED_OUTPATIENT_CLINIC_OR_DEPARTMENT_OTHER): Payer: Medicare HMO | Attending: Internal Medicine | Admitting: Internal Medicine

## 2021-12-24 DIAGNOSIS — B372 Candidiasis of skin and nail: Secondary | ICD-10-CM | POA: Diagnosis not present

## 2021-12-24 DIAGNOSIS — E11622 Type 2 diabetes mellitus with other skin ulcer: Secondary | ICD-10-CM | POA: Diagnosis not present

## 2021-12-24 DIAGNOSIS — Z7901 Long term (current) use of anticoagulants: Secondary | ICD-10-CM | POA: Insufficient documentation

## 2021-12-24 DIAGNOSIS — I4891 Unspecified atrial fibrillation: Secondary | ICD-10-CM | POA: Diagnosis not present

## 2021-12-24 DIAGNOSIS — L97812 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d: Secondary | ICD-10-CM | POA: Diagnosis not present

## 2021-12-24 DIAGNOSIS — L97111 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: Secondary | ICD-10-CM | POA: Diagnosis not present

## 2021-12-24 DIAGNOSIS — I890 Lymphedema, not elsewhere classified: Secondary | ICD-10-CM | POA: Diagnosis not present

## 2021-12-24 DIAGNOSIS — I87311 Chronic venous hypertension (idiopathic) with ulcer of right lower extremity: Secondary | ICD-10-CM

## 2021-12-27 NOTE — Progress Notes (Signed)
[name] Bridges ([id number]) [id number] Page 1 of 10 Visit Report for 12/24/2021 Arrival Information Details Patient Name: Date of Service: [name] Bridges, [name] [name] Bridges. 12/24/2021 3:00 PM Medical Record Number: [id number] Patient Account Number: [id number] Date of Birth/Sex: Treating RN: Jul 25, 1940 (81 y.o. [name] Bridges Primary Care Lateef Juncaj: [name] Other Clinician: Referring Wake Conlee: Treating Rehman Levinson/Extender: Durwin Reges in Treatment: 9 Visit Information History Since Last Visit Added or deleted any medications: No Patient Arrived: [name] Bridges Any new allergies or adverse reactions: No Arrival Time: 15:49 Had a fall or experienced change in No Accompanied By: Self activities of daily living that may affect Transfer Assistance: None risk of falls: Patient Identification Verified: Yes Signs or symptoms of abuse/neglect since last visito No Secondary Verification Process Completed: Yes Hospitalized since last visit: No Patient Requires Transmission-Based Precautions: No Implantable device outside of the clinic excluding No Patient Has Alerts: Yes cellular tissue based products placed in the center Patient Alerts: Patient on Blood Thinner since last visit: Has Dressing in Place as Prescribed: Yes Has Compression in Place as Prescribed: Yes Pain Present Now: No Electronic Signature(s) Signed: 12/24/2021 4:27:32 PM By: Sharyn Creamer RN, BSN Entered By: Sharyn Creamer on 12/24/2021 15:50:01 -------------------------------------------------------------------------------- Clinic Level of Care Assessment Details Patient Name: Date of Service: [name] Bridges. 12/24/2021 3:00 PM Medical Record Number: [id number] Patient Account Number: [id number] Date of Birth/Sex: Treating RN: 10/27/1940 (81 y.o. [name] Bridges Primary Care Coda Filler: [name] Other Clinician: Referring Matteo Banke: Treating Zadyn Yardley/Extender:  Durwin Reges in Treatment: 9 Clinic Level of Care Assessment Items TOOL 4 Quantity Score X- 1 0 Use when only an EandM is performed on FOLLOW-UP visit ASSESSMENTS - Nursing Assessment / Reassessment X- 1 10 Reassessment of Co-morbidities (includes updates in patient status) X- 1 5 Reassessment of Adherence to Treatment Plan ASSESSMENTS - Wound and Skin A ssessment / Reassessment X - Simple Wound Assessment / Reassessment - one wound 1 5 '[]'$  - 0 Complex Wound Assessment / Reassessment - multiple wounds X- 1 10 Dermatologic / Skin Assessment (not related to wound area) ASSESSMENTS - Focused Assessment X- 1 5 Circumferential Edema Measurements - multi extremities '[]'$  - 0 Nutritional Assessment / Counseling / Intervention [name] Bridges, [name] Bridges ([id number]) [id number] Page 2 of 10 '[]'$  - 0 Lower Extremity Assessment (monofilament, tuning fork, pulses) '[]'$  - 0 Peripheral Arterial Disease Assessment (using hand held doppler) ASSESSMENTS - Ostomy and/or Continence Assessment and Care '[]'$  - 0 Incontinence Assessment and Management '[]'$  - 0 Ostomy Care Assessment and Management (repouching, etc.) PROCESS - Coordination of Care X - Simple Patient / Family Education for ongoing care 1 15 '[]'$  - 0 Complex (extensive) Patient / Family Education for ongoing care X- 1 10 Staff obtains Programmer, systems, Records, T Results / Process Orders est X- 1 10 Staff telephones HHA, Nursing Homes / Clarify orders / etc '[]'$  - 0 Routine Transfer to another Facility (non-emergent condition) '[]'$  - 0 Routine Hospital Admission (non-emergent condition) '[]'$  - 0 New Admissions / Biomedical engineer / Ordering NPWT Apligraf, etc. , '[]'$  - 0 Emergency Hospital Admission (emergent condition) X- 1 10 Simple Discharge Coordination '[]'$  - 0 Complex (extensive) Discharge Coordination PROCESS - Special Needs '[]'$  - 0 Pediatric / Minor Patient Management '[]'$  - 0 Isolation  Patient Management '[]'$  - 0 Hearing / Language / Visual special needs '[]'$  - 0 Assessment of Community assistance (transportation, D/C planning, etc.) '[]'$  - 0 Additional assistance / Altered mentation '[]'$  - 0 Support  Surface(s) Assessment (bed, cushion, seat, etc.) INTERVENTIONS - Wound Cleansing / Measurement X - Simple Wound Cleansing - one wound 1 5 '[]'$  - 0 Complex Wound Cleansing - multiple wounds X- 1 5 Wound Imaging (photographs - any number of wounds) '[]'$  - 0 Wound Tracing (instead of photographs) X- 1 5 Simple Wound Measurement - one wound '[]'$  - 0 Complex Wound Measurement - multiple wounds INTERVENTIONS - Wound Dressings '[]'$  - 0 Small Wound Dressing one or multiple wounds '[]'$  - 0 Medium Wound Dressing one or multiple wounds X- 1 20 Large Wound Dressing one or multiple wounds '[]'$  - 0 Application of Medications - topical '[]'$  - 0 Application of Medications - injection INTERVENTIONS - Miscellaneous '[]'$  - 0 External ear exam '[]'$  - 0 Specimen Collection (cultures, biopsies, blood, body fluids, etc.) '[]'$  - 0 Specimen(s) / Culture(s) sent or taken to Lab for analysis '[]'$  - 0 Patient Transfer (multiple staff / Civil Service fast streamer / Similar devices) '[]'$  - 0 Simple Staple / Suture removal (25 or less) '[]'$  - 0 Complex Staple / Suture removal (26 or more) '[]'$  - 0 Hypo / Hyperglycemic Management (close monitor of Blood Glucose) [name] Bridges, [name] Bridges ([id number]) [id number] Page 3 of 10 '[]'$  - 0 Ankle / Brachial Index (ABI) - do not check if billed separately X- 1 5 Vital Signs Has the patient been seen at the hospital within the last three years: Yes Total Score: 120 Level Of Care: New/Established - Level 4 Electronic Signature(s) Signed: 12/25/2021 10:46:15 AM By: Deon Pilling RN, BSN Entered By: Deon Pilling on 12/24/2021 16:40:42 -------------------------------------------------------------------------------- Encounter Discharge Information Details Patient Name: Date  of Service: [name] Bridges. 12/24/2021 3:00 PM Medical Record Number: [id number] Patient Account Number: [id number] Date of Birth/Sex: Treating RN: September 04, 1940 (81 y.o. [name] Bridges Primary Care Nicki Gracy: [name] Other Clinician: Referring Rett Stehlik: Treating Saniah Schroeter/Extender: Durwin Reges in Treatment: 9 Encounter Discharge Information Items Discharge Condition: Stable Ambulatory Status: Walker Discharge Destination: Home Transportation: Private Auto Accompanied By: self Schedule Follow-up Appointment: Yes Clinical Summary of Care: Electronic Signature(s) Signed: 12/25/2021 10:46:15 AM By: Deon Pilling RN, BSN Entered By: Deon Pilling on 12/24/2021 16:40:13 -------------------------------------------------------------------------------- Lower Extremity Assessment Details Patient Name: Date of Service: [name] Bridges. 12/24/2021 3:00 PM Medical Record Number: [id number] Patient Account Number: [id number] Date of Birth/Sex: Treating RN: 04-06-1940 (81 y.o. [name] Bridges Primary Care Razi Hickle: [name] Other Clinician: Referring Kesley Gaffey: Treating Sherill Wegener/Extender: Durwin Reges in Treatment: 9 Edema Assessment Assessed: Shirlyn Goltz: No] Patrice Paradise: No] Edema: [Left: Ye] [Right: s] Calf Left: Right: Point of Measurement: 24 cm From Medial Instep 55 cm Ankle Left: Right: Point of Measurement: 7 cm From Medial Instep 27 cm Vascular Assessment Left: [[id number] Page 4 of 10Right:] Pulses: Dorsalis Pedis Palpable: [[id number] Page 4 of 10Yes] Electronic Signature(s) Signed: 12/24/2021 4:27:32 PM By: Sharyn Creamer RN, BSN Entered By: Sharyn Creamer on 12/24/2021 15:56:51 -------------------------------------------------------------------------------- Multi Wound Chart Details Patient Name: Date of Service: [name] Bridges. 12/24/2021 3:00  PM Medical Record Number: [id number] Patient Account Number: [id number] Date of Birth/Sex: Treating RN: [birth year] (81 y.o. F) Primary Care Ainsley Sanguinetti: [name] Other Clinician: Referring Meenakshi Sazama: Treating Hasaan Radde/Extender: Durwin Reges in Treatment: 9 Vital Signs Height(in): 62 Pulse(bpm): 47 Weight(lbs): 831 Blood Pressure(mmHg): 137/68 Body Mass Index(BMI): 54.5 Temperature(F): 98.5 Respiratory Rate(breaths/min): 18 [1:Photos:] [N/A:N/A] Right, Circumferential Lower Leg Right, Medial Upper Leg N/A Wound Location: Gradually Appeared Blister N/A Wounding Event: Lymphedema Lymphedema N/A  Primary Etiology: Cataracts, Lymphedema, Arrhythmia, Cataracts, Lymphedema, Arrhythmia, N/A Comorbid History: Hypertension, Peripheral Venous Hypertension, Peripheral Venous Disease, Type II Diabetes, Disease, Type II Diabetes, Osteoarthritis, Neuropathy Osteoarthritis, Neuropathy 07/29/2021 12/17/2021 N/A Date Acquired: 9 1 N/A Weeks of Treatment: Open Open N/A Wound Status: No No N/A Wound Recurrence: 4x0.5x0.1 0x0x0 N/A Measurements Bridges x W x D (cm) 1.571 0 N/A A (cm) : rea 0.157 0 N/A Volume (cm) : 99.00% 100.00% N/A % Reduction in Area: 99.50% 100.00% N/A % Reduction in Volume: Full Thickness Without Exposed Full Thickness Without Exposed N/A Classification: Support Structures Support Structures Large Large N/A Exudate Amount: Serous Serous N/A Exudate Type: Physiological scientist N/A Exudate Color: Distinct, outline attached Flat and Intact N/A Wound Margin: Large (67-100%) None Present (0%) N/A Granulation Amount: Red, Pink N/A N/A Granulation Quality: None Present (0%) None Present (0%) N/A Necrotic Amount: Fat Layer (Subcutaneous Tissue): Yes Fat Layer (Subcutaneous Tissue): Yes N/A Exposed Structures: Fascia: No Fascia: No Tendon: No Tendon: No Muscle: No Muscle: No Joint: No Joint: No Bone: No Bone: No Small (1-33%) Large  (67-100%) N/A Epithelialization: Excoriation: No No Abnormalities Noted N/A Periwound Skin Texture: Induration: No Grima, Randye Bridges ([id number]) [phone].pdf Page 5 of 10 Callus: No Crepitus: No Rash: No Scarring: No Maceration: No Dry/Scaly: Yes N/A Periwound Skin Moisture: Dry/Scaly: No Hemosiderin Staining: Yes Erythema: Yes N/A Periwound Skin Color: Atrophie Blanche: No Cyanosis: No Ecchymosis: No Erythema: No Mottled: No Pallor: No Rubor: No No Abnormality No Abnormality N/A Temperature: Treatment Notes Electronic Signature(s) Signed: 12/27/2021 1:40:10 PM By: [name] DO Entered By: [name] on 12/24/2021 16:15:39 -------------------------------------------------------------------------------- Multi-Disciplinary Care Plan Details Patient Name: Date of Service: [name] Bridges. 12/24/2021 3:00 PM Medical Record Number: [id number] Patient Account Number: [id number] Date of Birth/Sex: Treating RN: Oct 06, 1940 (81 y.o. [name] Bridges Primary Care Zhania Shaheen: [name] Other Clinician: Referring Laberta Wilbon: Treating Richelle Glick/Extender: Durwin Reges in Treatment: 9 Active Inactive Venous Leg Ulcer Nursing Diagnoses: Actual venous Insuffiency (use after diagnosis is confirmed) Goals: Patient will maintain optimal edema control Date Initiated: 10/21/2021 Target Resolution Date: 02/23/2022 Goal Status: Active Interventions: Assess peripheral edema status every visit. Compression as ordered Provide education on venous insufficiency Treatment Activities: Therapeutic compression applied : 10/21/2021 Notes: Wound/Skin Impairment Nursing Diagnoses: Impaired tissue integrity Goals: Patient/caregiver will verbalize understanding of skin care regimen Date Initiated: 10/21/2021 Target Resolution Date: 02/23/2022 Goal Status: Active Ulcer/skin breakdown will have a volume reduction of 30% by week  4 Date Initiated: 10/21/2021 Target Resolution Date: 02/24/2022 Goal Status: Active Interventions: Assess patient/caregiver ability to obtain necessary supplies Henner, Vinie Bridges ([id number]) [id number] Page 6 of 10 Assess patient/caregiver ability to perform ulcer/skin care regimen upon admission and as needed Assess ulceration(s) every visit Provide education on ulcer and skin care Treatment Activities: Topical wound management initiated : 10/21/2021 Notes: Electronic Signature(s) Signed: 12/25/2021 10:46:15 AM By: Deon Pilling RN, BSN Entered By: Deon Pilling on 12/24/2021 16:08:24 -------------------------------------------------------------------------------- Pain Assessment Details Patient Name: Date of Service: [name] Bridges. 12/24/2021 3:00 PM Medical Record Number: [id number] Patient Account Number: [id number] Date of Birth/Sex: Treating RN: 1940/02/25 (81 y.o. [name] Bridges Primary Care Jayanna Kroeger: [name] Other Clinician: Referring Wanda Cellucci: Treating Louie Flenner/Extender: Durwin Reges in Treatment: 9 Active Problems Location of Pain Severity and Description of Pain Patient Has Paino No Site Locations Pain Management and Medication Current Pain Management: Electronic Signature(s) Signed: 12/24/2021 4:27:32 PM By: Sharyn Creamer RN, BSN Entered By: Sharyn Creamer on 12/24/2021  15:50:24 -------------------------------------------------------------------------------- Patient/Caregiver Education Details Patient Name: Date of Service: Jannifer Rodney 10/31/2023andnbsp3:00 PM Medical Record Number: [id number] Patient Account Number: [id number] Date of Birth/Gender: Treating RN: 02/02/1941 (81 y.o. Mirta, Mally ([id number]) [id number] Page 7 of 10 Primary Care Physician: [name] Other Clinician: Referring Physician: Treating Physician/Extender:  Durwin Reges in Treatment: 9 Education Assessment Education Provided To: Patient Education Topics Provided Wound/Skin Impairment: Handouts: Skin Care Do's and Dont's Methods: Explain/Verbal Responses: Reinforcements needed Electronic Signature(s) Signed: 12/25/2021 10:46:15 AM By: Deon Pilling RN, BSN Entered By: Deon Pilling on 12/24/2021 16:08:36 -------------------------------------------------------------------------------- Wound Assessment Details Patient Name: Date of Service: [name] Bridges. 12/24/2021 3:00 PM Medical Record Number: [id number] Patient Account Number: [id number] Date of Birth/Sex: Treating RN: 07/03/1940 (81 y.o. [name] Bridges Primary Care Nichoel Digiulio: [name] Other Clinician: Referring Thailyn Khalid: Treating Orchid Glassberg/Extender: Durwin Reges in Treatment: 9 Wound Status Wound Number: 1 Primary Lymphedema Etiology: Wound Location: Right, Circumferential Lower Leg Wound Open Wounding Event: Gradually Appeared Status: Date Acquired: 07/29/2021 Comorbid Cataracts, Lymphedema, Arrhythmia, Hypertension, Peripheral Weeks Of Treatment: 9 History: Venous Disease, Type II Diabetes, Osteoarthritis, Neuropathy Clustered Wound: No Photos Wound Measurements Length: (cm) 4 Width: (cm) 0.5 Depth: (cm) 0.1 Area: (cm) 1.571 Volume: (cm) 0.157 % Reduction in Area: 99% % Reduction in Volume: 99.5% Epithelialization: Small (1-33%) Tunneling: No Undermining: No Wound Description Classification: Full Thickness Without Exposed Suppor Wound Margin: Distinct, outline attached Exudate Amount: Large Exudate Type: Serous Exudate Color: amber [name] Bridges, [name] Bridges ([id number]) t Structures Foul Odor After Cleansing: No Slough/Fibrino No [id number] Page 8 of 10 Wound Bed Granulation Amount: Large (67-100%) Exposed Structure Granulation Quality: Red, Pink Fascia Exposed:  No Necrotic Amount: None Present (0%) Fat Layer (Subcutaneous Tissue) Exposed: Yes Tendon Exposed: No Muscle Exposed: No Joint Exposed: No Bone Exposed: No Periwound Skin Texture Texture Color No Abnormalities Noted: Yes No Abnormalities Noted: No Atrophie Blanche: No Moisture Cyanosis: No No Abnormalities Noted: Yes Ecchymosis: No Erythema: No Hemosiderin Staining: Yes Mottled: No Pallor: No Rubor: No Temperature / Pain Temperature: No Abnormality Treatment Notes Wound #1 (Lower Leg) Wound Laterality: Right, Circumferential Cleanser Soap and Water Discharge Instruction: May shower and wash wound with dial antibacterial soap and water prior to dressing change. Wound Cleanser Discharge Instruction: Cleanse the wound with wound cleanser prior to applying a clean dressing using gauze sponges, not tissue or cotton balls. Peri-Wound Care Sween Lotion (Moisturizing lotion) Discharge Instruction: Apply moisturizing lotion as directed Topical Compounded Antibiotic Discharge Instruction: Apply to reddened areas Primary Dressing KerraCel Ag Gelling Fiber Dressing, 4x5 in (silver alginate) Discharge Instruction: Apply silver alginate to wound bed as instructed Secondary Dressing ABD Pad, 8x10 Discharge Instruction: Apply over primary dressing as directed. Zetuvit Plus 4x8 in Discharge Instruction: Apply over primary dressing as directed. Secured With Compression Wrap Kerlix Roll 4.5x3.1 (in/yd) Discharge Instruction: Apply Kerlix and Coban compression as directed. Coban Self-Adherent Wrap 4x5 (in/yd) Discharge Instruction: Apply over Kerlix as directed. Compression Stockings Add-Ons Electronic Signature(s) Signed: 12/24/2021 4:27:32 PM By: Sharyn Creamer RN, BSN Entered By: Sharyn Creamer on 12/24/2021 15:59:07 [name] Bridges ([id number]) [id number] Page 9 of  10 -------------------------------------------------------------------------------- Wound Assessment Details Patient Name: Date of ServiceRush Bridges, [name] [name] Bridges. 12/24/2021 3:00 PM Medical Record Number: [id number] Patient Account Number: [id number] Date of Birth/Sex: Treating RN: Mar 28, 1940 (81 y.o. [name] Bridges Primary Care Kacia Halley: [name] Other Clinician: Referring Salote Weidmann: Treating Asani Deniston/Extender: Durwin Reges in  Treatment: 9 Wound Status Wound Number: 2 Primary Lymphedema Etiology: Wound Location: Right, Medial Upper Leg Wound Open Wounding Event: Blister Status: Date Acquired: 12/17/2021 Comorbid Cataracts, Lymphedema, Arrhythmia, Hypertension, Peripheral Weeks Of Treatment: 1 History: Venous Disease, Type II Diabetes, Osteoarthritis, Neuropathy Clustered Wound: No Photos Wound Measurements Length: (cm) Width: (cm) Depth: (cm) Area: (cm) Volume: (cm) 0 % Reduction in Area: 100% 0 % Reduction in Volume: 100% 0 Epithelialization: Large (67-100%) 0 Tunneling: No 0 Undermining: No Wound Description Classification: Full Thickness Without Exposed Support Wound Margin: Flat and Intact Exudate Amount: Large Exudate Type: Serous Exudate Color: amber Structures Foul Odor After Cleansing: No Slough/Fibrino No Wound Bed Granulation Amount: None Present (0%) Exposed Structure Necrotic Amount: None Present (0%) Fascia Exposed: No Fat Layer (Subcutaneous Tissue) Exposed: Yes Tendon Exposed: No Muscle Exposed: No Joint Exposed: No Bone Exposed: No Periwound Skin Texture Texture Color No Abnormalities Noted: Yes No Abnormalities Noted: Yes Moisture Temperature / Pain No Abnormalities Noted: No Temperature: No Abnormality Dry / Scaly: Yes Electronic Signature(s) Signed: 12/24/2021 4:27:32 PM By: Sharyn Creamer RN, BSN Entered By: Sharyn Creamer on 12/24/2021 16:00:24 [name] Bridges, [name] Bridges ([id number])  [id number] Page 10 of 10 -------------------------------------------------------------------------------- Vitals Details Patient Name: Date of Service: [name] Bridges, [name] [name] Bridges. 12/24/2021 3:00 PM Medical Record Number: [id number] Patient Account Number: [id number] Date of Birth/Sex: Treating RN: 1940/09/24 (81 y.o. [name] Bridges Primary Care Tiffinie Caillier: [name] Other Clinician: Referring Makell Cyr: Treating Raphaela Cannaday/Extender: Durwin Reges in Treatment: 9 Vital Signs Time Taken: 15:54 Temperature (F): 98.5 Height (in): 62 Pulse (bpm): 73 Weight (lbs): 298 Respiratory Rate (breaths/min): 18 Body Mass Index (BMI): 54.5 Blood Pressure (mmHg): 137/68 Reference Range: 80 - 120 mg / dl Electronic Signature(s) Signed: 12/24/2021 4:27:32 PM By: Sharyn Creamer RN, BSN Entered By: Sharyn Creamer on 12/24/2021 15:55:10

## 2021-12-27 NOTE — Progress Notes (Signed)
[name], Bridges ([id number]) [id number] Page 1 of 8 Visit Report for 12/24/2021 Chief Complaint Document Details Patient Name: Date of Service: [name], [place] RO [name] L. 12/24/2021 3:00 PM Medical Record Number: [id number] Patient Account Number: [id number] Date of Birth/Sex: Treating RN: 1940/09/22 (81 y.o. F) Primary Care Provider: [name] Other Clinician: Referring Provider: Treating Provider/Extender: Durwin Reges in Treatment: 9 Information Obtained from: Patient Chief Complaint 10/21/2021; right lower extremity wound Electronic Signature(s) Signed: 12/27/2021 1:40:10 PM By: [name] DO Entered By: [name] on 12/24/2021 16:16:04 -------------------------------------------------------------------------------- HPI Details Patient Name: Date of Service: [name] Bridges, [place] RO [name] L. 12/24/2021 3:00 PM Medical Record Number: [id number] Patient Account Number: [id number] Date of Birth/Sex: Treating RN: 06-05-1940 (81 y.o. F) Primary Care Provider: [name] Other Clinician: Referring Provider: Treating Provider/Extender: Durwin Reges in Treatment: 9 History of Present Illness HPI Description: Admission 10/21/2021 Ms. [name] is an 81 year old female with a past medical history of morbid obesity, insulin-dependent type 2 diabetes, atrial fibrillation on Eliquis and lymphedema/venous insufficiency that presents to the clinic for a right lower extremity wound that has been present for the past 2 months. She has a history of wounds that wax and wane in healing over the past 2 years. She has been following with [facility] for this issue. They have been using Kerlix and Ace bandage along with silver alginate. She is also on torsemide 40 mg daily. She has been taking this dose for the past 5 years. She states she has a lot of drainage from the wound beds and often requires  several dressing changes throughout the day. She has home health that comes out twice weekly. She has lymphedema pumps that she uses twice daily. Currently she denies signs of infection. 9/5; patient presents for follow-up. We have been using PolyMem silver under Kerlix/Coban. She states she tolerated the compression wrap well but felt like it was very tight. Home health has been coming out however they did not have any supplies for the patient and has been using ABD pads with an Ace bandage. She was able to follow-up last week for nurse visit. She is able to make it 24 hours without drainage coming completely through the wrap/ace bandage. She denies signs of infection. 9/12; patient presents for follow-up. We have been using PolyMem silver under Kerlix/Coban. She reports less drainage from the wound bed and can last almost 2 days without a wrap change. 9/19; patient presents for follow-up. We have been using PolyMem silver under Kerlix/Coban. She tried ketoconazole and it improved her symptoms of itching however she still has significant erythema on exam. 9/26; patient presents for follow-up. We have been using PolyMem silver with ketoconazole under Kerlix/Coban. She has no issues with the wrap today. She states she has had this changed every other day by home health. I gave her cephalexin at last clinic visit however she states she broke out in hive. She immediately stopped the medication and had no further issues. She has had cephalosporins in the past without issues. 10/3; patient presents for follow-up. She had a wound culture done at last clinic visit that showed extra high levels of Serratia marcescens, Pseudomonas aeruginosa, Morganella morganii I, Enterococcus faecalis. Keystone antibiotic was ordered. Patient has not been contacted for purchase yet. We have been using calcium alginate under Kerlix/Coban as she does not tolerate a higher compression wrap. 10/10; patient presents for  follow-up. She has been using Keystone antibiotics and has done  this for the past 3 dressing changes. She reports improvement in drainage and a decrease in rednessaround the periwound. We have also been using silver alginate under Kerlix/Coban. 10/17; patient presents for follow-up. She has been using Keystone topical antibiotic spray with silver alginate under Kerlix/Coban. She reports improvement in JOURNIE, HOWSON ([id number]) [id number] Page 2 of 8 wound healing. She reports a decrease in drainage and her wrap not having to be changed as often. 12/17/2021: Unfortunately, she had a significant increase in drainage that saturated her dressings and skin, despite frequent changes. She also had breakdown of the skin on her upper medial thigh. Her skin is quite erythematous and she has not been able to use her lymphedema pumps on the right due to pain. 10/31; patient presents for follow-up. At last clinic visit she had developed cellulitis and was prescribed levofloxacin. She states that her symptoms have improved greatly. She no longer has erythema to the right lower extremity. She still has drainage. She has been using silver alginate and Keystone antibiotic under Ace bandage. She states that home health has discharged her but the reason is unclear. Electronic Signature(s) Signed: 12/27/2021 1:40:10 PM By: [name] DO Entered By: [name] on 12/24/2021 16:16:54 -------------------------------------------------------------------------------- Physical Exam Details Patient Name: Date of Service: [name] Brid[place], [place] RO [name] L. 12/24/2021 3:00 PM Medical Record Number: [id number] Patient Account Number: [id number] Date of Birth/Sex: Treating RN: 1940/05/24 (81 y.o. F) Primary Care Provider: [name] Other Clinician: Referring Provider: Treating Provider/Extender: Durwin Reges in Treatment: 9 Constitutional respirations regular,  non-labored and within target range for patient.. Cardiovascular 2+ dorsalis pedis/posterior tibialis pulses. Psychiatric pleasant and cooperative. Notes Right lower extremity: Small pinpoint open area with clear fluid draining. Leg is not erythematous. Venous stasis dermatitis. Electronic Signature(s) Signed: 12/27/2021 1:40:10 PM By: [name] DO Entered By: [name] on 12/24/2021 16:20:00 -------------------------------------------------------------------------------- Physician Orders Details Patient Name: Date of Service: [name] [place], [place] RO [name] L. 12/24/2021 3:00 PM Medical Record Number: [id number] Patient Account Number: [id number] Date of Birth/Sex: Treating RN: Jan 23, 1941 (81 y.o. Helene Shoe, Meta.Reding Primary Care Provider: [name] Other Clinician: Referring Provider: Treating Provider/Extender: Durwin Reges in Treatment: 9 Verbal / Phone Orders: No Diagnosis Coding ICD-10 Coding Code Description [phone] 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d I87.311 Chronic venous hypertension (idiopathic) with ulcer of right lower extremity E11.622 Type 2 diabetes mellitus with other skin ulcer I48.91 Unspecified atrial fibrillation Z79.01 Long term (current) use of anticoagulants Uhls, Khadeejah L ([id number]) [id number] Page 3 of 8 B37.2 Candidiasis of skin and nail L97.111 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 - Dr. Heber [place] Return A Nurse Visit: - Monday or Tuesday 12/30/2021 or 12/31/2021 Will try another home health agency for weekly and wound center weekly. Anesthetic (In clinic) Topical Lidocaine 5% applied to wound bed (In clinic) Topical Lidocaine 4% applied to wound bed Bathing/ Shower/ Hygiene May shower with protection but do not get wound dressing(s) wet. - Can shower with cast  protector bag from [facility] or [place]. Edema Control - Lymphedema / SCD / Other Lymphedema Pumps. Use Lymphedema pumps on leg(s) 2-3 times a day for 45-60 minutes. If wearing any wraps or hose, do not remove them. Continue exercising as instructed. - 2 times a day throughout the day. Elevate legs to the level of the heart or above for 30 minutes daily and/or when sitting,  a frequency of: - throughout the day Avoid standing for long periods of time. Exercise regularly Additional Orders / Instructions Follow Nutritious Diet - Monitor/Control Blood Sugar Home Health New wound care orders this week; continue Home Health for wound care. May utilize formulary equivalent dressing for wound treatment orders unless otherwise specified. - alginate Ag abd pad, kerlix, and coban weekly changes by home health. Wound Treatment Wound #1 - Lower Leg Wound Laterality: Right, Circumferential Cleanser: Soap and Water (Home Health) 2 x Per Week/30 Days Discharge Instructions: May shower and wash wound with dial antibacterial soap and water prior to dressing change. Cleanser: Wound Cleanser (Home Health) 2 x Per Week/30 Days Discharge Instructions: Cleanse the wound with wound cleanser prior to applying a clean dressing using gauze sponges, not tissue or cotton balls. Peri-Wound Care: Sween Lotion (Moisturizing lotion) (Home Health) 2 x Per Week/30 Days Discharge Instructions: Apply moisturizing lotion as directed Topical: Compounded Antibiotic 2 x Per Week/30 Days Discharge Instructions: Apply to reddened areas Prim Dressing: KerraCel Ag Gelling Fiber Dressing, 4x5 in (silver alginate) (Home Health) 2 x Per Week/30 Days ary Discharge Instructions: Apply silver alginate to wound bed as instructed Secondary Dressing: ABD Pad, 8x10 (Home Health) 2 x Per Week/30 Days Discharge Instructions: Apply over primary dressing as directed. Secondary Dressing: Zetuvit Plus 4x8 in [facility]) 2 x Per Week/30  Days Discharge Instructions: Apply over primary dressing as directed. Compression Wrap: Kerlix Roll 4.5x3.1 (in/yd) 2 x Per Week/30 Days Discharge Instructions: Apply Kerlix and Coban compression as directed. Compression Wrap: Coban Self-Adherent Wrap 4x5 (in/yd) 2 x Per Week/30 Days Discharge Instructions: Apply over Kerlix as directed. Electronic Signature(s) Signed: 12/27/2021 1:40:10 PM By: [name] DO Entered By: [name] on 12/24/2021 16:20:08 -------------------------------------------------------------------------------- Problem List Details Patient Name: Date of Service: [name] [place], [place] RO [name] L. 12/24/2021 3:00 PM Medical Record Number: [id number] Patient Account Number: [id number] Date of Birth/Sex: Treating RN: 02-14-1941 (81 y.o. F) Primary Care Provider: [name] Other Clinician: MILTON, STREICHER ([id number]) [id number] Page 4 of 8 Referring Provider: Treating Provider/Extender: Durwin Reges in Treatment: 9 Active Problems ICD-10 Encounter Code Description Active Date MDM Diagnosis L97.812 Non-pressure chronic ulcer of other part of right lower leg with fat layer 10/21/2021 No Yes exposed I87.311 Chronic venous hypertension (idiopathic) with ulcer of right lower extremity 10/21/2021 No Yes E11.622 Type 2 diabetes mellitus with other skin ulcer 10/21/2021 No Yes I48.91 Unspecified atrial fibrillation 10/21/2021 No Yes Z79.01 Long term (current) use of anticoagulants 10/21/2021 No Yes B37.2 Candidiasis of skin and nail 11/05/2021 No Yes L97.111 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 12/24/2021 3:00 PM Medical Record Number: [id number] Patient Account Number: [id number] Date of Birth/Sex: Treating RN: 10-04-1940 (81 y.o. F) Primary Care Provider: [name] Other Clinician: Referring Provider: Treating Provider/Extender: Durwin Reges in Treatment: 9 Subjective Chief Complaint Information obtained from Patient 10/21/2021; right lower extremity wound History of Present Illness (HPI) Admission 10/21/2021 Ms. [name] Bridges is an 81 year old female with a past medical history of morbid obesity, insulin-dependent type 2 diabetes, atrial fibrillation on Eliquis and lymphedema/venous insufficiency that presents to the clinic for a right lower extremity wound that has been present for the past 2 months. She has a  history of wounds that wax and wane in healing over the past 2 years. She has been following with [facility] for this issue. They have been using Mitnick, Kelyse L ([id number]) [id number] Page 5 of 8 Kerlix and Ace bandage along with silver alginate. She is also on torsemide 40 mg daily. She has been taking this dose for the past 5 years. She states she has a lot of drainage from the wound beds and often requires several dressing changes throughout the day. She has home health that comes out twice weekly. She has lymphedema pumps that she uses twice daily. Currently she denies signs of infection. 9/5; patient presents for follow-up. We have been using PolyMem silver under Kerlix/Coban. She states she tolerated the compression wrap well but felt like it was very tight. Home health has been coming out however they did not have any supplies for the patient and has been using ABD pads with an Ace bandage. She was able to follow-up last week for nurse visit. She is able to make it 24 hours without drainage coming completely through the wrap/ace  bandage. She denies signs of infection. 9/12; patient presents for follow-up. We have been using PolyMem silver under Kerlix/Coban. She reports less drainage from the wound bed and can last almost 2 days without a wrap change. 9/19; patient presents for follow-up. We have been using PolyMem silver under Kerlix/Coban. She tried ketoconazole and it improved her symptoms of itching however she still has significant erythema on exam. 9/26; patient presents for follow-up. We have been using PolyMem silver with ketoconazole under Kerlix/Coban. She has no issues with the wrap today. She states she has had this changed every other day by home health. I gave her cephalexin at last clinic visit however she states she broke out in hive. She immediately stopped the medication and had no further issues. She has had cephalosporins in the past without issues. 10/3; patient presents for follow-up. She had a wound culture done at last clinic visit that showed extra high levels of Serratia marcescens, Pseudomonas aeruginosa, Morganella morganii I, Enterococcus faecalis. Keystone antibiotic was ordered. Patient has not been contacted for purchase yet. We have been using calcium alginate under Kerlix/Coban as she does not tolerate a higher compression wrap. 10/10; patient presents for follow-up. She has been using Keystone antibiotics and has done this for the past 3 dressing changes. She reports improvement in drainage and a decrease in rednessaround the periwound. We have also been using silver alginate under Kerlix/Coban. 10/17; patient presents for follow-up. She has been using Keystone topical antibiotic spray with silver alginate under Kerlix/Coban. She reports improvement in wound healing. She reports a decrease in drainage and her wrap not having to be changed as often. 12/17/2021: Unfortunately, she had a significant increase in drainage that saturated her dressings and skin, despite frequent changes. She also  had breakdown of the skin on her upper medial thigh. Her skin is quite erythematous and she has not been able to use her lymphedema pumps on the right due to pain. 10/31; patient presents for follow-up. At last clinic visit she had developed cellulitis and was prescribed levofloxacin. She states that her symptoms have improved greatly. She no longer has erythema to the right lower extremity. She still has drainage. She has been using silver alginate and Keystone antibiotic under Ace bandage. She states that home health has discharged her but the reason is unclear. Patient History Information obtained from Patient. Family History Diabetes - Siblings,Mother, Heart  Disease - Mother, Hypertension - Mother, Kidney Disease - Mother, Stroke - Father. Social History Never smoker, Marital Status - Married, Alcohol Use - Never, Drug Use - No History, Caffeine Use - Rarely. Medical History Eyes Patient has history of Cataracts Hematologic/Lymphatic Patient has history of Lymphedema Cardiovascular Patient has history of Arrhythmia - A fib, Hypertension, Peripheral Venous Disease Endocrine Patient has history of Type II Diabetes Musculoskeletal Patient has history of Osteoarthritis Neurologic Patient has history of Neuropathy Medical A Surgical History Notes nd Genitourinary CKD Objective Constitutional respirations regular, non-labored and within target range for patient.. Vitals Time Taken: 3:54 PM, Height: 62 in, Weight: 298 lbs, BMI: 54.5, Temperature: 98.5 F, Pulse: 73 bpm, Respiratory Rate: 18 breaths/min, Blood Pressure: 137/68 mmHg. Cardiovascular 2+ dorsalis pedis/posterior tibialis pulses. Psychiatric pleasant and cooperative. General Notes: Right lower extremity: Small pinpoint open area with clear fluid draining. Leg is not erythematous. Venous stasis dermatitis. CAELI, LINEHAN ([id number]) [id number] Page 6 of 8 Integumentary (Hair, Skin) Wound  #1 status is Open. Original cause of wound was Gradually Appeared. The date acquired was: 07/29/2021. The wound has been in treatment 9 weeks. The wound is located on the Right,Circumferential Lower Leg. The wound measures 4cm length x 0.5cm width x 0.1cm depth; 1.571cm^2 area and 0.157cm^3 volume. There is Fat Layer (Subcutaneous Tissue) exposed. There is no tunneling or undermining noted. There is a large amount of serous drainage noted. The wound margin is distinct with the outline attached to the wound base. There is large (67-100%) red, pink granulation within the wound bed. There is no necrotic tissue within the wound bed. The periwound skin appearance had no abnormalities noted for texture. The periwound skin appearance had no abnormalities noted for moisture. The periwound skin appearance exhibited: Hemosiderin Staining. The periwound skin appearance did not exhibit: Atrophie Blanche, Cyanosis, Ecchymosis, Mottled, Pallor, Rubor, Erythema. Periwound temperature was noted as No Abnormality. Wound #2 status is Open. Original cause of wound was Blister. The date acquired was: 12/17/2021. The wound has been in treatment 1 weeks. The wound is located on the Right,Medial Upper Leg. The wound measures 0cm length x 0cm width x 0cm depth; 0cm^2 area and 0cm^3 volume. There is Fat Layer (Subcutaneous Tissue) exposed. There is no tunneling or undermining noted. There is a large amount of serous drainage noted. The wound margin is flat and intact. There is no granulation within the wound bed. There is no necrotic tissue within the wound bed. The periwound skin appearance had no abnormalities noted for texture. The periwound skin appearance had no abnormalities noted for color. The periwound skin appearance exhibited: Dry/Scaly. Periwound temperature was noted as No Abnormality. 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 Celine Ahr and she prescribed levofloxacin for cellulitis. Patient's symptoms have improved greatly. She still has a small pinpoint open area that is draining clear fluid continuously. She has lymphedema pumps. I recommended She use these daily. For now recommended continue with [facility] antibiotic and silver alginate with absorbent dressings under Kerlix/Coban. We will try and reestablish home health. It is unclear why they stopped coming. Follow-up in 1 week. Plan Follow-up Appointments: Return Appointment in 2 weeks. -  Dr. Heber  Nurse Visit: - Monday or Tuesday 12/30/2021 or 12/31/2021 Will try another home health agency for weekly and wound center weekly. Anesthetic: (In clinic) Topical Lidocaine 5% applied to wound bed (In clinic) Topical Lidocaine 4% applied to wound bed Bathing/ Shower/ Hygiene: May shower with protection but do not get wound dressing(s) wet. - Can shower with cast protector bag from [facility] or [place]. Edema Control - Lymphedema / SCD / Other: Lymphedema Pumps. Use Lymphedema pumps on leg(s) 2-3 times a day for 45-60 minutes. If wearing any wraps or hose, do not remove them. Continue exercising as instructed. - 2 times a day throughout the day. Elevate legs to the level of the heart or above for 30 minutes daily and/or when sitting, a frequency of: - throughout the day Avoid standing for long periods of time. Exercise regularly Additional Orders / Instructions: Follow Nutritious Diet - Monitor/Control Blood Sugar Home Health: New wound care orders this week; continue Home Health for wound care. May utilize formulary equivalent dressing for  wound treatment orders unless otherwise specified. - alginate Ag abd pad, kerlix, and coban weekly changes by home health. WOUND #1: - Lower Leg Wound Laterality: Right, Circumferential Cleanser: Soap and Water (Home Health) 2 x Per Week/30 Days Discharge Instructions: May shower and wash wound with dial antibacterial soap and water prior to dressing change. Cleanser: Wound Cleanser (Home Health) 2 x Per Week/30 Days Discharge Instructions: Cleanse the wound with wound cleanser prior to applying a clean dressing using gauze sponges, not tissue or cotton balls. Peri-Wound Care: Sween Lotion (Moisturizing lotion) (Home Health) 2 x Per Week/30 Days Discharge Instructions: Apply moisturizing lotion as directed Topical: Compounded Antibiotic 2 x Per Week/30 Days Discharge Instructions: Apply to reddened areas Prim Dressing: KerraCel Ag Gelling Fiber Dressing, 4x5 in (silver alginate) (Home Health) 2 x Per Week/30 Days ary Discharge Instructions: Apply silver alginate to wound bed as instructed Secondary Dressing: ABD Pad, 8x10 (Home Health) 2 x Per Week/30 Days Discharge Instructions: Apply over primary dressing as directed. Secondary Dressing: Zetuvit Plus 4x8 in [facility]) 2 x Per Week/30 Days Discharge Instructions: Apply over primary dressing as directed. Com pression Wrap: Kerlix Roll 4.5x3.1 (in/yd) 2 x Per Week/30 Days Discharge Instructions: Apply Kerlix and Coban compression as directed. Com pression Wrap: Coban Self-Adherent Wrap 4x5 (in/yd) 2 x Per Week/30 Days Discharge Instructions: Apply over Kerlix as directed. 1. Silver alginate with Keystone antibiotic under Kerlix/Coban 2. Follow-up in 1 week for nurse visit in 2 weeks for physician visit CHRISHAUNA, MEE ([id number]) [id number] Page 7 of 8 3. Lymphedema pumps daily Electronic Signature(s) Signed: 12/27/2021 1:40:10 PM By: [name] DO Entered By: [name] on 12/24/2021  16:28:36 -------------------------------------------------------------------------------- HxROS Details Patient Name: Date of Service: [name] Bridges, [place] RO [name] L. 12/24/2021 3:00 PM Medical Record Number: [id number] Patient Account Number: [id number] Date of Birth/Sex: Treating RN: [birth year] (81 y.o. F) Primary Care Provider: [name] Other Clinician: Referring Provider: Treating Provider/Extender: Durwin Reges in Treatment: 9 Information Obtained From Patient Eyes Medical History: Positive for: Cataracts Hematologic/Lymphatic Medical History: Positive for: Lymphedema Cardiovascular Medical History: Positive for: Arrhythmia - A fib; Hypertension; Peripheral Venous Disease Endocrine Medical History: Positive for: Type II Diabetes Time with diabetes: 30 years Treated with: Insulin Blood sugar tested every day: Yes Tested : 3x day Genitourinary Medical History: Past Medical History Notes: CKD Musculoskeletal Medical History: Positive for: Osteoarthritis Neurologic Medical History: Positive for: Neuropathy HBO Extended History Items Eyes: Cataracts Immunizations Pneumococcal Vaccine:  Received Pneumococcal Vaccination: Yes Received Pneumococcal Vaccination On or After 60th BirthdaySELDA, JALBERT ([id number]) [id number] Page 8 of 8 Implantable Devices None Family and Social History Diabetes: Yes - Siblings,Mother; Heart Disease: Yes - Mother; Hypertension: Yes - Mother; Kidney Disease: Yes - Mother; Stroke: Yes - Father; Never smoker; Marital Status - Married; Alcohol Use: Never; Drug Use: No History; Caffeine Use: Rarely; Financial Concerns: No; Food, Clothing or Shelter Needs: No; Support System Lacking: No; Transportation Concerns: No Electronic Signature(s) Signed: 12/27/2021 1:40:10 PM By: [name] DO Entered By: [name] on 12/24/2021  16:17:00 -------------------------------------------------------------------------------- SuperBill Details Patient Name: Date of Service: [name] Bridges, [place] RO [name] L. 12/24/2021 Medical Record Number: [id number] Patient Account Number: [id number] Date of Birth/Sex: Treating RN: 1940-11-11 (81 y.o. 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 I89.0 Lymphedema, not elsewhere classified Facility Procedures : CPT4 Code: 88916945 Description: [place] VISIT-LEV 4 EST PT Modifier: Quantity: 1 Physician Procedures : CPT4 Code Description Modifier [id number] 00349 - WC PHYS LEVEL 3 - EST PT ICD-10 Diagnosis Description L97.812 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d I87.311 Chronic venous hypertension (idiopathic) with ulcer  of right lower extremity E11.622 Type 2 diabetes mellitus with other skin ulcer I89.0 Lymphedema, not elsewhere classified Quantity: 1 Electronic Signature(s) Signed: 12/25/2021 10:46:15 AM By: Deon Pilling RN, BSN Signed: 12/27/2021 1:40:10 PM By: [name] DO Entered By: Deon Pilling on 12/24/2021 16:40:48

## 2021-12-31 ENCOUNTER — Encounter (HOSPITAL_BASED_OUTPATIENT_CLINIC_OR_DEPARTMENT_OTHER): Payer: Medicare HMO | Admitting: General Surgery

## 2022-01-06 ENCOUNTER — Encounter (HOSPITAL_BASED_OUTPATIENT_CLINIC_OR_DEPARTMENT_OTHER): Payer: Medicare HMO | Attending: Internal Medicine | Admitting: Internal Medicine

## 2022-01-06 DIAGNOSIS — I4891 Unspecified atrial fibrillation: Secondary | ICD-10-CM | POA: Insufficient documentation

## 2022-01-06 DIAGNOSIS — I872 Venous insufficiency (chronic) (peripheral): Secondary | ICD-10-CM | POA: Insufficient documentation

## 2022-01-06 DIAGNOSIS — I129 Hypertensive chronic kidney disease with stage 1 through stage 4 chronic kidney disease, or unspecified chronic kidney disease: Secondary | ICD-10-CM | POA: Insufficient documentation

## 2022-01-06 DIAGNOSIS — L97812 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d: Secondary | ICD-10-CM | POA: Diagnosis not present

## 2022-01-06 DIAGNOSIS — E11622 Type 2 diabetes mellitus with other skin ulcer: Secondary | ICD-10-CM | POA: Insufficient documentation

## 2022-01-06 DIAGNOSIS — I890 Lymphedema, not elsewhere classified: Secondary | ICD-10-CM | POA: Diagnosis not present

## 2022-01-06 DIAGNOSIS — Z794 Long term (current) use of insulin: Secondary | ICD-10-CM | POA: Insufficient documentation

## 2022-01-06 DIAGNOSIS — E1151 Type 2 diabetes mellitus with diabetic peripheral angiopathy without gangrene: Secondary | ICD-10-CM | POA: Diagnosis not present

## 2022-01-06 DIAGNOSIS — I87311 Chronic venous hypertension (idiopathic) with ulcer of right lower extremity: Secondary | ICD-10-CM | POA: Diagnosis not present

## 2022-01-06 DIAGNOSIS — E1140 Type 2 diabetes mellitus with diabetic neuropathy, unspecified: Secondary | ICD-10-CM | POA: Insufficient documentation

## 2022-01-06 DIAGNOSIS — E1122 Type 2 diabetes mellitus with diabetic chronic kidney disease: Secondary | ICD-10-CM | POA: Diagnosis not present

## 2022-01-06 DIAGNOSIS — B372 Candidiasis of skin and nail: Secondary | ICD-10-CM | POA: Insufficient documentation

## 2022-01-06 DIAGNOSIS — M1990 Unspecified osteoarthritis, unspecified site: Secondary | ICD-10-CM | POA: Diagnosis not present

## 2022-01-06 DIAGNOSIS — Z7901 Long term (current) use of anticoagulants: Secondary | ICD-10-CM | POA: Insufficient documentation

## 2022-01-06 DIAGNOSIS — N189 Chronic kidney disease, unspecified: Secondary | ICD-10-CM | POA: Diagnosis not present

## 2022-01-08 NOTE — Progress Notes (Signed)
[name] Bridges ([id number]) [id number] Page 1 of 6 Visit Report for 01/06/2022 Chief Complaint Document Details Patient Name: Date of Service: [name], [place] [name] [name] 01/06/2022 2:00 PM Medical Record Number: [id number] Patient Account Number: [id number] Date of Birth/Sex: Treating RN: 05/08/1940 (81 y.o. F) Primary Care Provider: [name] Other Clinician: Referring Provider: Treating Provider/Extender: Durwin Reges in Treatment: 11 Information Obtained from: Patient Chief Complaint 10/21/2021; right lower extremity wound Electronic Signature(s) Signed: 01/07/2022 6:10:28 PM By: [name] DO Entered By: [name] on 01/06/2022 16:00:40 -------------------------------------------------------------------------------- HPI Details Patient Name: Date of Service: [name], [place] [name] [name] 01/06/2022 2:00 PM Medical Record Number: [id number] Patient Account Number: [id number] Date of Birth/Sex: Treating RN: [birth year] (81 y.o. F) Primary Care Provider: [name] Other Clinician: Referring Provider: Treating Provider/Extender: Durwin Reges in Treatment: 11 History of Present Illness HPI Description: Admission 10/21/2021 Ms. [name] Bridges is an 81 year old female with a past medical history of morbid obesity, insulin-dependent type 2 diabetes, atrial fibrillation on Eliquis and lymphedema/venous insufficiency that presents to the clinic for a right lower extremity wound that has been present for the past 2 months. She has a history of wounds that wax and wane in healing over the past 2 years. She has been following with [facility] for this issue. They have been using Kerlix and Ace bandage along with silver alginate. She is also on torsemide 40 mg daily. She has been taking this dose for the past 5 years. She states she has a lot of drainage from the wound beds and often  requires several dressing changes throughout the day. She has home health that comes out twice weekly. She has lymphedema pumps that she uses twice daily. Currently she denies signs of infection. 9/5; patient presents for follow-up. We have been using PolyMem silver under Kerlix/Coban. She states she tolerated the compression wrap well but felt like it was very tight. Home health has been coming out however they did not have any supplies for the patient and has been using ABD pads with an Ace bandage. She was able to follow-up last week for nurse visit. She is able to make it 24 hours without drainage coming completely through the wrap/ace bandage. She denies signs of infection. 9/12; patient presents for follow-up. We have been using PolyMem silver under Kerlix/Coban. She reports less drainage from the wound bed and can last almost 2 days without a wrap change. 9/19; patient presents for follow-up. We have been using PolyMem silver under Kerlix/Coban. She tried ketoconazole and it improved her symptoms of itching however she still has significant erythema on exam. 9/26; patient presents for follow-up. We have been using PolyMem silver with ketoconazole under Kerlix/Coban. She has no issues with the wrap today. She states she has had this changed every other day by home health. I gave her cephalexin at last clinic visit however she states she broke out in hive. She immediately stopped the medication and had no further issues. She has had cephalosporins in the past without issues. 10/3; patient presents for follow-up. She had a wound culture done at last clinic visit that showed extra high levels of Serratia marcescens, Pseudomonas aeruginosa, Morganella morganii I, Enterococcus faecalis. Keystone antibiotic was ordered. Patient has not been contacted for purchase yet. We have been using calcium alginate under Kerlix/Coban as she does not tolerate a higher compression wrap. 10/10; patient presents for  follow-up. She has been using Keystone antibiotics and has done  this for the past 3 dressing changes. She reports improvement in drainage and a decrease in rednessaround the periwound. We have also been using silver alginate under Kerlix/Coban. 10/17; patient presents for follow-up. She has been using Keystone topical antibiotic spray with silver alginate under Kerlix/Coban. She reports improvement in SADAF, PRZYBYSZ ([id number]) [id number] Page 2 of 6 wound healing. She reports a decrease in drainage and her wrap not having to be changed as often. 12/17/2021: Unfortunately, she had a significant increase in drainage that saturated her dressings and skin, despite frequent changes. She also had breakdown of the skin on her upper medial thigh. Her skin is quite erythematous and she has not been able to use her lymphedema pumps on the right due to pain. 10/31; patient presents for follow-up. At last clinic visit she had developed cellulitis and was prescribed levofloxacin. She states that her symptoms have improved greatly. She no longer has erythema to the right lower extremity. She still has drainage. She has been using silver alginate and Keystone antibiotic under Ace bandage. She states that home health has discharged her but the reason is unclear. 11/13; patient presents for follow-up. She has been reapproved for home health. She has been using silver alginate to the wound beds under compression therapy. She does not want to go up on a higher compression from Kerlix/Coban. She changes the dressing daily. Electronic Signature(s) Signed: 01/07/2022 6:10:28 PM By: [name] DO Entered By: [name] on 01/06/2022 16:13:12 -------------------------------------------------------------------------------- Physical Exam Details Patient Name: Date of Service: [name], [place] [name] [name] 01/06/2022 2:00 PM Medical Record Number: [id number] Patient Account Number:  [id number] Date of Birth/Sex: Treating RN: 1940/03/31 (81 y.o. F) Primary Care Provider: [name] Other Clinician: Referring Provider: Treating Provider/Extender: Durwin Reges in Treatment: 11 Constitutional respirations regular, non-labored and within target range for patient.. Cardiovascular 2+ dorsalis pedis/posterior tibialis pulses. Psychiatric pleasant and cooperative. Notes Right lower extremity: Small pinpoint open area with clear fluid draining, skin breakdown to the surrounding area.Venous stasis dermatitis. Small scattered open wounds to the anterior aspect that appear to be from scratching. Electronic Signature(s) Signed: 01/07/2022 6:10:28 PM By: [name] DO Entered By: [name] on 01/06/2022 16:22:02 -------------------------------------------------------------------------------- Physician Orders Details Patient Name: Date of Service: [name], [place] [name] [name] 01/06/2022 2:00 PM Medical Record Number: [id number] Patient Account Number: [id number] Date of Birth/Sex: Treating RN: Mar 08, 1940 (81 y.o. Benjaman Lobe Primary Care Provider: [name] Other Clinician: Referring Provider: Treating Provider/Extender: Durwin Reges in Treatment: 11 Verbal / Phone Orders: No Diagnosis Coding Follow-up Appointments ppointment in 1 week. - w/ Dr. Heber [place] Return A Anesthetic (In clinic) Topical Lidocaine 5% applied to wound bed (In clinic) Topical Lidocaine 4% applied to wound bed Robbins, Elvis L ([id number]) [id number] Page 3 of 6 Bathing/ Shower/ Hygiene May shower with protection but do not get wound dressing(s) wet. - Can shower with cast protector bag from [facility], [facility] or [facility]. Edema Control - Lymphedema / SCD / Other Lymphedema Pumps. Use Lymphedema pumps on leg(s) 2-3 times a day for 45-60 minutes. If wearing any wraps or hose, do not remove them.  Continue exercising as instructed. - 2 times a day throughout the day. Elevate legs to the level of the heart or above for 30 minutes daily and/or when sitting, a frequency of: - throughout the day Avoid standing for long periods of time. Exercise regularly Additional Orders / Instructions Follow Nutritious Diet - Monitor/Control Blood Sugar Home Health New  wound care orders this week; continue Home Health for wound care. May utilize formulary equivalent dressing for wound treatment orders unless otherwise specified. - alginate Ag abd pad, kerlix, and coban 2x weekly changes by home health. Wound Treatment Wound #1 - Lower Leg Wound Laterality: Right, Circumferential Cleanser: Soap and Water (Home Health) 2 x Per Week/30 Days Discharge Instructions: May shower and wash wound with dial antibacterial soap and water prior to dressing change. Cleanser: Wound Cleanser (Home Health) 2 x Per Week/30 Days Discharge Instructions: Cleanse the wound with wound cleanser prior to applying a clean dressing using gauze sponges, not tissue or cotton balls. Peri-Wound Care: Triamcinolone 15 (g) 2 x Per Week/30 Days Discharge Instructions: Use triamcinolone 15 (g) as directed Peri-Wound Care: Sween Lotion (Moisturizing lotion) (Home Health) 2 x Per Week/30 Days Discharge Instructions: Apply moisturizing lotion as directed Prim Dressing: KerraCel Ag Gelling Fiber Dressing, 4x5 in (silver alginate) (Home Health) 2 x Per Week/30 Days ary Discharge Instructions: Apply silver alginate to wound bed as instructed Secondary Dressing: ABD Pad, 8x10 (Home Health) 2 x Per Week/30 Days Discharge Instructions: Apply over primary dressing as directed. Secondary Dressing: Zetuvit Plus 4x8 in [facility]) 2 x Per Week/30 Days Discharge Instructions: Apply over primary dressing as directed. Compression Wrap: Kerlix Roll 4.5x3.1 (in/yd) 2 x Per Week/30 Days Discharge Instructions: Apply Kerlix and Coban compression as  directed. Compression Wrap: Coban Self-Adherent Wrap 4x5 (in/yd) 2 x Per Week/30 Days Discharge Instructions: Apply over Kerlix as directed. Electronic Signature(s) Signed: 01/06/2022 4:10:29 PM By: Rhae Hammock RN Signed: 01/07/2022 6:10:28 PM By: [name] DO Entered By: Rhae Hammock on 01/06/2022 14:44:58 -------------------------------------------------------------------------------- Problem List Details Patient Name: Date of Service: [name], [place] [name] [name] 01/06/2022 2:00 PM Medical Record Number: [id number] Patient Account Number: [id number] Date of Birth/Sex: Treating RN: [birth year] (81 y.o. F) Primary Care Provider: [name] Other Clinician: Referring Provider: Treating Provider/Extender: Durwin Reges in Treatment: 11 Active Problems ICD-10 Encounter Code Description Active Date MDM Diagnosis [phone] Non-pressure chronic ulcer of other part of right lower leg with fat layer 10/21/2021 No Yes Nylund, Epsie L ([id number]) [id number] Page 4 of 6 exposed I87.311 Chronic venous hypertension (idiopathic) with ulcer of right lower extremity 10/21/2021 No Yes E11.622 Type 2 diabetes mellitus with other skin ulcer 10/21/2021 No Yes I48.91 Unspecified atrial fibrillation 10/21/2021 No Yes Z79.01 Long term (current) use of anticoagulants 10/21/2021 No Yes B37.2 Candidiasis of skin and nail 11/05/2021 No Yes L97.111 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 01/06/2022 2:00 PM Medical Record Number: [id number] Patient Account Number: [id number] Date of  Birth/Sex: Treating RN: Jul 20, 1940 (81 y.o. F) Primary Care Provider: [name] Other Clinician: Referring Provider: Treating Provider/Extender: Durwin Reges in Treatment: 11 Information Obtained From Patient Eyes Medical History: Positive for: Cataracts Hematologic/Lymphatic Medical History: Positive for: Lymphedema Cardiovascular Medical History: Positive for: Arrhythmia - A fib; Hypertension; Peripheral Venous Disease Endocrine Medical History: Positive for: Type II Diabetes Heist, Oleva L ([id number]) [id number] Page 5 of 6 Time with diabetes: 30 years Treated with: Insulin Blood sugar tested every day: Yes Tested : 3x day Genitourinary Medical History: Past Medical History Notes: CKD Musculoskeletal Medical History: Positive for: Osteoarthritis Neurologic Medical History: Positive for: Neuropathy HBO Extended  History Items Eyes: Cataracts Immunizations Pneumococcal Vaccine: Received Pneumococcal Vaccination: Yes Received Pneumococcal Vaccination On or After 60th Birthday: Yes Implantable Devices None Family and Social History Diabetes: Yes - Siblings,Mother; Heart Disease: Yes - Mother; Hypertension: Yes - Mother; Kidney Disease: Yes - Mother; Stroke: Yes - Father; Never smoker; Marital Status - Married; Alcohol Use: Never; Drug Use: No History; Caffeine Use: Rarely; Financial Concerns: No; Food, Clothing or Shelter Needs: No; Support System Lacking: No; Transportation Concerns: No Electronic Signature(s) Signed: 01/07/2022 6:10:28 PM By: [name] DO Entered By: [name] on 01/06/2022 16:14:16 -------------------------------------------------------------------------------- SuperBill Details Patient Name: Date of Service: [name], [place] [name] [name] 01/06/2022 Medical Record Number: [id number] Patient Account Number: [id number] Date of Birth/Sex: Treating RN: Aug 27, 1940 (81 y.o. 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

## 2022-01-08 NOTE — Progress Notes (Signed)
LACHAE, HOHLER ([id number]) [id number] Page 1 of 10 Visit Report for 01/06/2022 Arrival Information Details Patient Name: Date of Service: [name] Bridges, [place] [name] Bridges. 01/06/2022 2:00 PM Medical Record Number: [id number] Patient Account Number: [id number] Date of Birth/Sex: Treating Bridges: 01-12-1941 (81 y.o. [name] Bridges, [name] Bridges Primary Care [name] Bridges: [name] Bridges Other Clinician: Referring [name] Bridges: Treating [name] Bridges/Extender: [name] Bridges in Treatment: 11 Visit Information History Since Last Visit Added or deleted any medications: No Patient Arrived: [name] Bridges Any new allergies or adverse reactions: No Arrival Time: 14:09 Had a fall or experienced change in No Accompanied By: self activities of daily living that may affect Transfer Assistance: None risk of falls: Patient Identification Verified: Yes Signs or symptoms of abuse/neglect since last visito No Secondary Verification Process Completed: Yes Hospitalized since last visit: No Patient Requires Transmission-Based Precautions: No Implantable device outside of the clinic excluding No Patient Has Alerts: Yes cellular tissue based products placed in the center Patient Alerts: Patient on Blood Thinner since last visit: Has Dressing in Place as Prescribed: Yes Pain Present Now: No Notes per patient [facility] willing go accept patient once patient is seen today. Electronic Signature(s) Signed: 01/06/2022 4:10:29 PM By: [name] Bridges Entered By: [name] Bridges on 01/06/2022 14:14:02 -------------------------------------------------------------------------------- Clinic Level of Care Assessment Details Patient Name: Date of Service: [name] Bridges, [place] [name] Bridges. 01/06/2022 2:00 PM Medical Record Number: [id number] Patient Account Number: [id number] Date of Birth/Sex: Treating Bridges: 1940-07-17 (81 y.o. [name] Bridges, [name] Bridges Primary Care [name] Bridges: [name] Bridges  Other Clinician: Referring [name] Bridges: Treating [name] Bridges/Extender: [name] Bridges in Treatment: 11 Clinic Level of Care Assessment Items TOOL 4 Quantity Score X- 1 0 Use when only an EandM is performed on FOLLOW-UP visit ASSESSMENTS - Nursing Assessment / Reassessment X- 1 10 Reassessment of Co-morbidities (includes updates in patient status) X- 1 5 Reassessment of Adherence to Treatment Plan ASSESSMENTS - Wound and Skin A ssessment / Reassessment X - Simple Wound Assessment / Reassessment - one wound 1 5 '[]'$  - 0 Complex Wound Assessment / Reassessment - multiple wounds '[]'$  - 0 Dermatologic / Skin Assessment (not related to wound area) ASSESSMENTS - Focused Assessment X- 1 5 Circumferential Edema Measurements - multi extremities Bridges, [name] Bridges ([id number]) [id number] Page 2 of 10 '[]'$  - 0 Nutritional Assessment / Counseling / Intervention '[]'$  - 0 Lower Extremity Assessment (monofilament, tuning fork, pulses) '[]'$  - 0 Peripheral Arterial Disease Assessment (using hand held doppler) ASSESSMENTS - Ostomy and/or Continence Assessment and Care '[]'$  - 0 Incontinence Assessment and Management '[]'$  - 0 Ostomy Care Assessment and Management (repouching, etc.) PROCESS - Coordination of Care X - Simple Patient / Family Education for ongoing care 1 15 '[]'$  - 0 Complex (extensive) Patient / Family Education for ongoing care X- 1 10 Staff obtains Programmer, systems, Records, T Results / Process Orders est '[]'$  - 0 Staff telephones HHA, Nursing Homes / Clarify orders / etc '[]'$  - 0 Routine Transfer to another Facility (non-emergent condition) '[]'$  - 0 Routine Hospital Admission (non-emergent condition) '[]'$  - 0 New Admissions / Biomedical engineer / Ordering NPWT Apligraf, etc. , '[]'$  - 0 Emergency Hospital Admission (emergent condition) X- 1 10 Simple Discharge Coordination '[]'$  - 0 Complex (extensive) Discharge Coordination PROCESS - Special Needs '[]'$   - 0 Pediatric / Minor Patient Management '[]'$  - 0 Isolation Patient Management '[]'$  - 0 Hearing / Language / Visual special needs '[]'$  - 0 Assessment of Community assistance (transportation, D/C planning, etc.) '[]'$  -  0 Additional assistance / Altered mentation '[]'$  - 0 Support Surface(s) Assessment (bed, cushion, seat, etc.) INTERVENTIONS - Wound Cleansing / Measurement X - Simple Wound Cleansing - one wound 1 5 '[]'$  - 0 Complex Wound Cleansing - multiple wounds X- 1 5 Wound Imaging (photographs - any number of wounds) '[]'$  - 0 Wound Tracing (instead of photographs) X- 1 5 Simple Wound Measurement - one wound '[]'$  - 0 Complex Wound Measurement - multiple wounds INTERVENTIONS - Wound Dressings '[]'$  - 0 Small Wound Dressing one or multiple wounds X- 1 15 Medium Wound Dressing one or multiple wounds '[]'$  - 0 Large Wound Dressing one or multiple wounds '[]'$  - 0 Application of Medications - topical '[]'$  - 0 Application of Medications - injection INTERVENTIONS - Miscellaneous '[]'$  - 0 External ear exam '[]'$  - 0 Specimen Collection (cultures, biopsies, blood, body fluids, etc.) '[]'$  - 0 Specimen(s) / Culture(s) sent or taken to Lab for analysis '[]'$  - 0 Patient Transfer (multiple staff / [name] Bridges / Similar devices) '[]'$  - 0 Simple Staple / Suture removal (25 or less) '[]'$  - 0 Complex Staple / Suture removal (26 or more) Holaway, [name] Bridges ([id number]) [id number] Page 3 of 10 '[]'$  - 0 Hypo / Hyperglycemic Management (close monitor of Blood Glucose) '[]'$  - 0 Ankle / Brachial Index (ABI) - do not check if billed separately X- 1 5 Vital Signs Has the patient been seen at the hospital within the last three years: Yes Total Score: 95 Level Of Care: New/Established - Level 3 Electronic Signature(s) Signed: 01/06/2022 4:10:29 PM By: [name] Bridges Entered By: [name] Bridges on 01/06/2022  14:46:54 -------------------------------------------------------------------------------- Encounter Discharge Information Details Patient Name: Date of Service: [name], [place] [name] Bridges. 01/06/2022 2:00 PM Medical Record Number: [id number] Patient Account Number: [id number] Date of Birth/Sex: Treating Bridges: 09-12-1940 (81 y.o. [name] Bridges, [name] Bridges Primary Care Keifer Habib: [name] Bridges Other Clinician: Referring Leahanna Buser: Treating Daisuke Bailey/Extender: [name] Bridges in Treatment: 11 Encounter Discharge Information Items Discharge Condition: Stable Ambulatory Status: Walker Discharge Destination: Home Transportation: Private Auto Accompanied By: self Schedule Follow-up Appointment: Yes Clinical Summary of Care: Patient Declined Electronic Signature(s) Signed: 01/06/2022 4:10:29 PM By: [name] Bridges Entered By: [name] Bridges on 01/06/2022 14:48:29 -------------------------------------------------------------------------------- Lower Extremity Assessment Details Patient Name: Date of Service: [name], [place] [name] Bridges. 01/06/2022 2:00 PM Medical Record Number: [id number] Patient Account Number: [id number] Date of Birth/Sex: Treating Bridges: January 06, 1941 (81 y.o. [name] Bridges, [name] Bridges Primary Care Hridhaan Yohn: [name] Bridges Other Clinician: Referring Ovie Eastep: Treating Kaio Kuhlman/Extender: [name] Bridges in Treatment: 11 Edema Assessment Assessed: Shirlyn Goltz: No] Patrice Paradise: No] Edema: [Left: Ye] [Right: s] Calf Left: Right: Point of Measurement: 24 cm From Medial Instep 55 cm Ankle Left: Right: Point of Measurement: 7 cm From Medial Instep 27 cm Harding, Savana Bridges ([id number]) [id number] Page 4 of 10 Vascular Assessment Pulses: Dorsalis Pedis Palpable: [Right:Yes] Electronic Signature(s) Signed: 01/06/2022 4:10:29 PM By: [name] Bridges Entered By: [name] Bridges on 01/06/2022  14:17:16 -------------------------------------------------------------------------------- Multi Wound Chart Details Patient Name: Date of Service: [name], [place] [name] Bridges. 01/06/2022 2:00 PM Medical Record Number: [id number] Patient Account Number: [id number] Date of Birth/Sex: Treating Bridges: January 27, 1941 (81 y.o. F) Primary Care Caraline Deutschman: [name] Bridges Other Clinician: Referring Karl Knarr: Treating Loukas Antonson/Extender: [name] Bridges in Treatment: 11 Vital Signs Height(in): 62 Pulse(bpm): 41 Weight(lbs): 322 Blood Pressure(mmHg): 152/65 Body Mass Index(BMI): 54.5 Temperature(F): 98.6 Respiratory Rate(breaths/min): 20 [1:Photos:] [N/A:N/A] Right, Circumferential Lower Leg N/A N/A Wound Location: Gradually Appeared N/A N/A  Wounding Event: Lymphedema N/A N/A Primary Etiology: Cataracts, Lymphedema, Arrhythmia, N/A N/A Comorbid History: Hypertension, Peripheral Venous Disease, Type II Diabetes, Osteoarthritis, Neuropathy 07/29/2021 N/A N/A Date Acquired: 11 N/A N/A Weeks of Treatment: Open N/A N/A Wound Status: No N/A N/A Wound Recurrence: Yes N/A N/A Clustered Wound: 5 N/A N/A Clustered Quantity: 19x20x0.1 N/A N/A Measurements Bridges x W x D (cm) 298.451 N/A N/A A (cm) : rea 29.845 N/A N/A Volume (cm) : -82.70% N/A N/A % Reduction in Area: 8.70% N/A N/A % Reduction in Volume: Full Thickness Without Exposed N/A N/A Classification: Support Structures Large N/A N/A Exudate Amount: [name] Bridges, [name] Bridges ([id number]) [id number] Page 5 of 10 Serous N/A N/A Exudate Type: amber N/A N/A Exudate Color: Distinct, outline attached N/A N/A Wound Margin: Large (67-100%) N/A N/A Granulation Amount: Red, Pink N/A N/A Granulation Quality: None Present (0%) N/A N/A Necrotic Amount: Fat Layer (Subcutaneous Tissue): Yes N/A N/A Exposed Structures: Fascia: No Tendon: No Muscle: No Joint: No Bone: No Small (1-33%) N/A  N/A Epithelialization: Excoriation: No N/A N/A Periwound Skin Texture: Induration: No Callus: No Crepitus: No Rash: No Scarring: No Maceration: No N/A N/A Periwound Skin Moisture: Dry/Scaly: No Hemosiderin Staining: Yes N/A N/A Periwound Skin Color: Atrophie Blanche: No Cyanosis: No Ecchymosis: No Erythema: No Mottled: No Pallor: No Rubor: No No Abnormality N/A N/A Temperature: Treatment Notes Wound #1 (Lower Leg) Wound Laterality: Right, Circumferential Cleanser Soap and Water Discharge Instruction: May shower and wash wound with dial antibacterial soap and water prior to dressing change. Wound Cleanser Discharge Instruction: Cleanse the wound with wound cleanser prior to applying a clean dressing using gauze sponges, not tissue or cotton balls. Peri-Wound Care Triamcinolone 15 (g) Discharge Instruction: Use triamcinolone 15 (g) as directed Sween Lotion (Moisturizing lotion) Discharge Instruction: Apply moisturizing lotion as directed Topical Primary Dressing KerraCel Ag Gelling Fiber Dressing, 4x5 in (silver alginate) Discharge Instruction: Apply silver alginate to wound bed as instructed Secondary Dressing ABD Pad, 8x10 Discharge Instruction: Apply over primary dressing as directed. Zetuvit Plus 4x8 in Discharge Instruction: Apply over primary dressing as directed. Secured With Compression Wrap Kerlix Roll 4.5x3.1 (in/yd) Discharge Instruction: Apply Kerlix and Coban compression as directed. Coban Self-Adherent Wrap 4x5 (in/yd) Discharge Instruction: Apply over Kerlix as directed. Compression Stockings Add-Ons Electronic Signature(s) Signed: 01/07/2022 6:10:28 PM By: [name] DO Entered By: [name] on 01/06/2022 16:00:22 [name] Bridges ([id number]) [id number] Page 6 of 10 -------------------------------------------------------------------------------- Multi-Disciplinary Care Plan Details Patient Name: Date of  Service: [name] Bridges, [place] [name] Bridges. 01/06/2022 2:00 PM Medical Record Number: [id number] Patient Account Number: [id number] Date of Birth/Sex: Treating Bridges: [birth year] (81 y.o. [name] Bridges, [name] Bridges Primary Care Tasman Zapata: [name] Bridges Other Clinician: Referring Jahred Tatar: Treating Ercia Crisafulli/Extender: [name] Bridges in Treatment: 11 Active Inactive Venous Leg Ulcer Nursing Diagnoses: Actual venous Insuffiency (use after diagnosis is confirmed) Goals: Patient will maintain optimal edema control Date Initiated: 10/21/2021 Target Resolution Date: 02/23/2022 Goal Status: Active Interventions: Assess peripheral edema status every visit. Compression as ordered Provide education on venous insufficiency Treatment Activities: Therapeutic compression applied : 10/21/2021 Notes: Wound/Skin Impairment Nursing Diagnoses: Impaired tissue integrity Goals: Patient/caregiver will verbalize understanding of skin care regimen Date Initiated: 10/21/2021 Target Resolution Date: 02/23/2022 Goal Status: Active Ulcer/skin breakdown will have a volume reduction of 30% by week 4 Date Initiated: 10/21/2021 Target Resolution Date: 02/24/2022 Goal Status: Active Interventions: Assess patient/caregiver ability to obtain necessary supplies Assess patient/caregiver ability to perform ulcer/skin care regimen upon admission and as needed Assess ulceration(s) every visit Provide  education on ulcer and skin care Treatment Activities: Topical wound management initiated : 10/21/2021 Notes: Electronic Signature(s) Signed: 01/06/2022 4:10:29 PM By: [name] Bridges Entered By: [name] Bridges on 01/06/2022 14:46:05 Pain Assessment Details -------------------------------------------------------------------------------- [name] Bridges ([id number]) [id number] Page 7 of 10 Patient Name: Date of ServiceRush Bridges, [place] [name] Bridges. 01/06/2022 2:00 PM Medical Record  Number: [id number] Patient Account Number: [id number] Date of Birth/Sex: Treating Bridges: 12/22/1940 (81 y.o. [name] Bridges, [name] Bridges Primary Care Hiro Vipond: [name] Bridges Other Clinician: Referring Chizara Mena: Treating Elma Limas/Extender: [name] Bridges in Treatment: 11 Active Problems Location of Pain Severity and Description of Pain Patient Has Paino No Site Locations Rate the pain. Current Pain Level: 0 Pain Management and Medication Current Pain Management: Medication: No Cold Application: No Rest: No Massage: No Activity: No T.E.N.S.: No Heat Application: No Leg drop or elevation: No Is the Current Pain Management Adequate: Adequate How does your wound impact your activities of daily livingo Sleep: No Bathing: No Appetite: No Relationship With Others: No Bladder Continence: No Emotions: No Bowel Continence: No Work: No Toileting: No Drive: No Dressing: No Hobbies: No Electronic Signature(s) Signed: 01/06/2022 4:10:29 PM By: [name] Bridges Entered By: [name] Bridges on 01/06/2022 14:14:29 -------------------------------------------------------------------------------- Patient/Caregiver Education Details Patient Name: Date of Service: [name] Bridges, [place] [name] Bridges. 11/13/2023andnbsp2:00 PM Medical Record Number: [id number] Patient Account Number: [id number] Date of Birth/Gender: Treating Bridges: 15-Nov-1940 (81 y.o. [name] Bridges, [name] Bridges Primary Care Physician: [name] Bridges Other Clinician: Referring Physician: Treating Physician/Extender: [name] Bridges in Treatment: 11 Education Assessment Education Provided To: Patient MARVENE, STROHM ([id number]) [id number] Page 8 of 10 Education Topics Provided Venous: Methods: Explain/Verbal Responses: Reinforcements needed, State content correctly Wound/Skin Impairment: Methods: Explain/Verbal Responses: Reinforcements needed, State content  correctly Electronic Signature(s) Signed: 01/06/2022 4:10:29 PM By: [name] Bridges Entered By: [name] Bridges on 01/06/2022 14:33:12 -------------------------------------------------------------------------------- Wound Assessment Details Patient Name: Date of Service: [name], [place] [name] Bridges. 01/06/2022 2:00 PM Medical Record Number: [id number] Patient Account Number: [id number] Date of Birth/Sex: Treating Bridges: [birth year] (81 y.o. [name] Bridges, [name] Bridges Primary Care Naliya Gish: [name] Bridges Other Clinician: Referring Joanne Salah: Treating Elyza Whitt/Extender: [name] Bridges in Treatment: 11 Wound Status Wound Number: 1 Primary Lymphedema Etiology: Wound Location: Right, Circumferential Lower Leg Wound Open Wounding Event: Gradually Appeared Status: Date Acquired: 07/29/2021 Comorbid Cataracts, Lymphedema, Arrhythmia, Hypertension, Peripheral Weeks Of Treatment: 11 History: Venous Disease, Type II Diabetes, Osteoarthritis, Neuropathy Clustered Wound: Yes Photos Wound Measurements Length: (cm) 19 Width: (cm) 20 Depth: (cm) 0.1 Clustered Quantity: 5 Area: (cm) 298.451 Volume: (cm) 29.845 % Reduction in Area: -82.7% % Reduction in Volume: 8.7% Epithelialization: Small (1-33%) Tunneling: No Undermining: No Wound Description Classification: Full Thickness Without Exposed Sup Wound Margin: Distinct, outline attached Exudate Amount: Large Exudate Type: Serous Exudate Color: amber port Structures Foul Odor After Cleansing: No Slough/Fibrino No Wound Bed Granulation Amount: Large (67-100%) Exposed Structure Granulation Quality: Red, Pink Fascia Exposed: No Necrotic Amount: None Present (0%) Fat Layer (Subcutaneous Tissue) Exposed: Yes Tendon Exposed: No [name] Bridges, [name] Bridges ([id number]) [id number] Page 9 of 10 Muscle Exposed: No Joint Exposed: No Bone Exposed: No Periwound Skin Texture Texture Color No Abnormalities  Noted: Yes No Abnormalities Noted: No Atrophie Blanche: No Moisture Cyanosis: No No Abnormalities Noted: Yes Ecchymosis: No Erythema: No Hemosiderin Staining: Yes Mottled: No Pallor: No Rubor: No Temperature / Pain Temperature: No Abnormality Treatment Notes Wound #1 (Lower Leg) Wound Laterality: Right, Circumferential Cleanser Soap and Water Discharge Instruction: May shower and  wash wound with dial antibacterial soap and water prior to dressing change. Wound Cleanser Discharge Instruction: Cleanse the wound with wound cleanser prior to applying a clean dressing using gauze sponges, not tissue or cotton balls. Peri-Wound Care Triamcinolone 15 (g) Discharge Instruction: Use triamcinolone 15 (g) as directed Sween Lotion (Moisturizing lotion) Discharge Instruction: Apply moisturizing lotion as directed Topical Primary Dressing KerraCel Ag Gelling Fiber Dressing, 4x5 in (silver alginate) Discharge Instruction: Apply silver alginate to wound bed as instructed Secondary Dressing ABD Pad, 8x10 Discharge Instruction: Apply over primary dressing as directed. Zetuvit Plus 4x8 in Discharge Instruction: Apply over primary dressing as directed. Secured With Compression Wrap Kerlix Roll 4.5x3.1 (in/yd) Discharge Instruction: Apply Kerlix and Coban compression as directed. Coban Self-Adherent Wrap 4x5 (in/yd) Discharge Instruction: Apply over Kerlix as directed. Compression Stockings Add-Ons Electronic Signature(s) Signed: 01/06/2022 4:10:29 PM By: [name] Bridges Signed: 01/07/2022 5:53:09 PM By: Deon Pilling Bridges, BSN Entered By: Deon Pilling on 01/06/2022 14:19:21 -------------------------------------------------------------------------------- Vitals Details Patient Name: Date of Service: [name], [place] [name] Bridges. 01/06/2022 2:00 PM [name] Bridges, [name] Bridges ([id number]) [id number] Page 10 of 10 Medical Record Number: [id number] Patient Account Number:  [id number] Date of Birth/Sex: Treating Bridges: 10-Mar-1940 (81 y.o. [name] Bridges, [name] Bridges Primary Care Lyan Holck: [name] Bridges Other Clinician: Referring Minas Bonser: Treating Dayyan Krist/Extender: [name] Bridges in Treatment: 11 Vital Signs Time Taken: 14:15 Temperature (F): 98.6 Height (in): 62 Pulse (bpm): 69 Weight (lbs): 298 Respiratory Rate (breaths/min): 20 Body Mass Index (BMI): 54.5 Blood Pressure (mmHg): 152/65 Reference Range: 80 - 120 mg / dl Electronic Signature(s) Signed: 01/06/2022 4:10:29 PM By: [name] Bridges Entered By: [name] Bridges on 01/06/2022 14:14:18

## 2022-01-13 ENCOUNTER — Encounter (HOSPITAL_BASED_OUTPATIENT_CLINIC_OR_DEPARTMENT_OTHER): Payer: Medicare HMO | Admitting: Internal Medicine

## 2022-01-13 DIAGNOSIS — I87311 Chronic venous hypertension (idiopathic) with ulcer of right lower extremity: Secondary | ICD-10-CM

## 2022-01-13 DIAGNOSIS — L97812 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d: Secondary | ICD-10-CM

## 2022-01-13 DIAGNOSIS — E11622 Type 2 diabetes mellitus with other skin ulcer: Secondary | ICD-10-CM | POA: Diagnosis not present

## 2022-01-13 NOTE — Progress Notes (Signed)
TEREA, NEUBAUER ([id number]) [id number] Page 1 of 9 Visit Report for 01/13/2022 Arrival Information Details Patient Name: Date of Service: [name] Bridges, [name] 01/13/2022 1:15 PM Medical Record Number: [id number] Patient Account Number: [id number] Date of Birth/Sex: Treating RN: 02-01-1941 (81 y.o. [name] Bridges, Tammi Klippel Primary Care [name] Bridges: [name] Bridges Other Clinician: Referring [name] Bridges: Treating [name] Bridges/Extender: [name] Bridges in Treatment: 12 Visit Information History Since Last Visit Added or deleted any medications: No Patient Arrived: [name] Any new allergies or adverse reactions: No Arrival Time: 13:04 Had a fall or experienced change in No Accompanied By: self activities of daily living that may affect Transfer Assistance: None risk of falls: Patient Identification Verified: Yes Signs or symptoms of abuse/neglect since last visito No Secondary Verification Process Completed: Yes Hospitalized since last visit: No Patient Requires Transmission-Based Precautions: No Implantable device outside of the clinic excluding No Patient Has Alerts: Yes cellular tissue based products placed in the center Patient Alerts: Patient on Blood Thinner since last visit: Has Dressing in Place as Prescribed: Yes Has Compression in Place as Prescribed: Yes Pain Present Now: Yes Electronic Signature(s) Signed: 01/13/2022 4:27:51 PM By: [name] Pilling RN, BSN Entered By: [name] Bridges on 01/13/2022 13:04:41 -------------------------------------------------------------------------------- Clinic Level of Care Assessment Details Patient Name: Date of Service: [name] Bridges, [name] 01/13/2022 1:15 PM Medical Record Number: [id number] Patient Account Number: [id number] Date of Birth/Sex: Treating RN: 09-02-1940 (81 y.o. [name] Bridges, [name] Bridges Primary Care [name] Bridges: [name] Bridges Other Clinician: Referring [name] Bridges: Treating [name] Bridges/Extender:  [name] Bridges in Treatment: 12 Clinic Level of Care Assessment Items TOOL 4 Quantity Score X- 1 0 Use when only an EandM is performed on FOLLOW-UP visit ASSESSMENTS - Nursing Assessment / Reassessment X- 1 10 Reassessment of Co-morbidities (includes updates in patient status) X- 1 5 Reassessment of Adherence to Treatment Plan ASSESSMENTS - Wound and Skin A ssessment / Reassessment '[]'$  - 0 Simple Wound Assessment / Reassessment - one wound X- 2 5 Complex Wound Assessment / Reassessment - multiple wounds '[]'$  - 0 Dermatologic / Skin Assessment (not related to wound area) ASSESSMENTS - Focused Assessment X- 1 5 Circumferential Edema Measurements - multi extremities '[]'$  - 0 Nutritional Assessment / Counseling / Intervention [name], [name] ([id number]) [id number] Page 2 of 9 '[]'$  - 0 Lower Extremity Assessment (monofilament, tuning fork, pulses) '[]'$  - 0 Peripheral Arterial Disease Assessment (using hand held doppler) ASSESSMENTS - Ostomy and/or Continence Assessment and Care '[]'$  - 0 Incontinence Assessment and Management '[]'$  - 0 Ostomy Care Assessment and Management (repouching, etc.) PROCESS - Coordination of Care '[]'$  - 0 Simple Patient / Family Education for ongoing care X- 1 20 Complex (extensive) Patient / Family Education for ongoing care X- 1 10 Staff obtains Programmer, systems, Records, T Results / Process Orders est '[]'$  - 0 Staff telephones HHA, Nursing Homes / Clarify orders / etc '[]'$  - 0 Routine Transfer to another Facility (non-emergent condition) '[]'$  - 0 Routine Hospital Admission (non-emergent condition) '[]'$  - 0 New Admissions / Biomedical engineer / Ordering NPWT Apligraf, etc. , '[]'$  - 0 Emergency Hospital Admission (emergent condition) '[]'$  - 0 Simple Discharge Coordination X- 1 15 Complex (extensive) Discharge Coordination PROCESS - Special Needs '[]'$  - 0 Pediatric / Minor Patient Management '[]'$  - 0 Isolation Patient  Management '[]'$  - 0 Hearing / Language / Visual special needs '[]'$  - 0 Assessment of Community assistance (transportation, D/C planning, etc.) '[]'$  - 0 Additional assistance / Altered mentation '[]'$  - 0 Support Surface(s) Assessment (  bed, cushion, seat, etc.) INTERVENTIONS - Wound Cleansing / Measurement '[]'$  - 0 Simple Wound Cleansing - one wound X- 2 5 Complex Wound Cleansing - multiple wounds X- 1 5 Wound Imaging (photographs - any number of wounds) '[]'$  - 0 Wound Tracing (instead of photographs) '[]'$  - 0 Simple Wound Measurement - one wound X- 2 5 Complex Wound Measurement - multiple wounds INTERVENTIONS - Wound Dressings '[]'$  - 0 Small Wound Dressing one or multiple wounds X- 1 15 Medium Wound Dressing one or multiple wounds '[]'$  - 0 Large Wound Dressing one or multiple wounds X- 1 5 Application of Medications - topical '[]'$  - 0 Application of Medications - injection INTERVENTIONS - Miscellaneous '[]'$  - 0 External ear exam '[]'$  - 0 Specimen Collection (cultures, biopsies, blood, body fluids, etc.) '[]'$  - 0 Specimen(s) / Culture(s) sent or taken to Lab for analysis '[]'$  - 0 Patient Transfer (multiple staff / Civil Service fast streamer / Similar devices) '[]'$  - 0 Simple Staple / Suture removal (25 or less) '[]'$  - 0 Complex Staple / Suture removal (26 or more) '[]'$  - 0 Hypo / Hyperglycemic Management (close monitor of Blood Glucose) [name], [name] ([id number]) [id number] Page 3 of 9 '[]'$  - 0 Ankle / Brachial Index (ABI) - do not check if billed separately X- 1 5 Vital Signs Has the patient been seen at the hospital within the last three years: Yes Total Score: 125 Level Of Care: New/Established - Level 4 Electronic Signature(s) Signed: 01/13/2022 4:45:06 PM By: Rhae Hammock RN Entered By: Rhae Hammock on 01/13/2022 13:26:40 -------------------------------------------------------------------------------- Encounter Discharge Information Details Patient Name: Date of  Service: [name] [name] Bridges, [name] 01/13/2022 1:15 PM Medical Record Number: [id number] Patient Account Number: [id number] Date of Birth/Sex: Treating RN: 29-Mar-1940 (81 y.o. [name] Bridges, [name] Bridges Primary Care Dai Apel: [name] Bridges Other Clinician: Referring Jubal Rademaker: Treating Nayeli Calvert/Extender: [name] Bridges in Treatment: 12 Encounter Discharge Information Items Discharge Condition: Stable Ambulatory Status: Ambulatory Discharge Destination: Home Transportation: Private Auto Accompanied By: self Schedule Follow-up Appointment: Yes Clinical Summary of Care: Patient Declined Electronic Signature(s) Signed: 01/13/2022 4:45:06 PM By: Rhae Hammock RN Entered By: Rhae Hammock on 01/13/2022 13:27:13 -------------------------------------------------------------------------------- Lower Extremity Assessment Details Patient Name: Date of Service: [name] [name] Bridges, [name] 01/13/2022 1:15 PM Medical Record Number: [id number] Patient Account Number: [id number] Date of Birth/Sex: Treating RN: 11/17/1940 (81 y.o. [name] Primary Care Gaila Engebretsen: [name] Bridges Other Clinician: Referring Abdikadir Fohl: Treating Loyed Wilmes/Extender: [name] Bridges in Treatment: 12 Edema Assessment Assessed: Shirlyn Goltz: No] Patrice Paradise: Yes] Edema: [Left: Ye] [Right: s] Calf Left: Right: Point of Measurement: 24 cm From Medial Instep 46 cm Ankle Left: Right: Point of Measurement: 7 cm From Medial Instep 25 cm Vascular Assessment Left: [id number] Page 4 of 9Right:] Pulses: Dorsalis Pedis Palpable: [id number] Page 4 of 9Yes] Electronic Signature(s) Signed: 01/13/2022 4:27:51 PM By: [name] Pilling RN, BSN Entered By: [name] Bridges on 01/13/2022 13:10:44 -------------------------------------------------------------------------------- Multi Wound Chart Details Patient Name: Date of Service: [name] [name] Bridges, [name] RO  [name] 01/13/2022 1:15 PM Medical Record Number: [id number] Patient Account Number: [id number] Date of Birth/Sex: Treating RN: 1940/10/31 (81 y.o. F) Primary Care Lorris Carducci: [name] Bridges Other Clinician: Referring Brexton Sofia: Treating Delquan [name] Bridges/Extender: [name] Bridges in Treatment: 12 Vital Signs Height(in): 69 Pulse(bpm): 21 Weight(lbs): 852 Blood Pressure(mmHg): 158/77 Body Mass Index(BMI): 54.5 Temperature(F): 98.3 Respiratory Rate(breaths/min): 20 [1:Photos: No Photos Right, Circumferential Lower Leg Wound Location: Gradually Appeared Wounding Event: Lymphedema Primary Etiology: Cataracts, Lymphedema, Arrhythmia, N/A Comorbid History: Hypertension, Peripheral Venous  Disease, Type II Diabetes, Osteoarthritis,  Neuropathy 07/29/2021 Date Acquired: 12 Weeks of Treatment: Open Wound Status: No Wound Recurrence: Yes Clustered Wound: 1 Clustered Quantity: 1.4x1.8x0.1 Measurements L x W x D (cm) 1.979 A (cm) : rea 0.198 Volume (cm) : 98.80% % Reduction in Area:  99.40% % Reduction in Volume: Full Thickness Without Exposed Classification: Support Structures Medium Exudate Amount: Serosanguineous Exudate Type: red, brown Exudate Color: Distinct, outline attached Wound Margin: Large (67-100%) Granulation Amount:  Red, Pink Granulation Quality: None Present (0%) Necrotic Amount: Fat Layer (Subcutaneous Tissue): Yes N/A Exposed Structures: Fascia: No Tendon: No Muscle: No Joint: No Bone: No Large (67-100%) Epithelialization: Excoriation: No Periwound Skin Texture:  Induration: No Callus: No Crepitus: No Rash: No Scarring: No Maceration: No Periwound Skin Moisture: Dry/Scaly: No Hemosiderin Staining: Yes Periwound Skin Color: Atrophie Blanche: No] [N/A:N/A N/A N/A N/A N/A N/A N/A N/A N/A N/A N/A N/A N/A N/A N/A N/A  N/A N/A N/A N/A N/A N/A N/A N/A N/A N/A N/A] Reigle, Anabela [name] ([id number]) [1:Cyanosis: No Ecchymosis: No Erythema: No Mottled: No Pallor: No Rubor: No No  Abnormality Temperature: no weeping out the right medial lower N/A Assessment Notes: leg.] [N/A:N/A] Treatment Notes Electronic Signature(s) Signed: 01/13/2022 1:38:49 PM By: [name] DO Entered By: [name] on 01/13/2022 13:25:00 -------------------------------------------------------------------------------- Multi-Disciplinary Care Plan Details Patient Name: Date of Service: [name] [name] Bridges, [name] 01/13/2022 1:15 PM Medical Record Number: [id number] Patient Account Number: [id number] Date of Birth/Sex: Treating RN: 09/26/1940 (81 y.o. [name] Bridges, [name] Bridges Primary Care Wilder Amodei: [name] Bridges Other Clinician: Referring Cyndy Braver: Treating Evadene Wardrip/Extender: [name] Bridges in Treatment: 12 Active Inactive Venous Leg Ulcer Nursing Diagnoses: Actual venous Insuffiency (use after diagnosis is confirmed) Goals: Patient will maintain optimal edema control Date Initiated: 10/21/2021 Target Resolution Date: 02/23/2022 Goal Status: Active Interventions: Assess peripheral edema status every visit. Compression as ordered Provide education on venous insufficiency Treatment Activities: Therapeutic compression applied : 10/21/2021 Notes: Wound/Skin Impairment Nursing Diagnoses: Impaired tissue integrity Goals: Patient/caregiver will verbalize understanding of skin care regimen Date Initiated: 10/21/2021 Target Resolution Date: 02/23/2022 Goal Status: Active Ulcer/skin breakdown will have a volume reduction of 30% by week 4 Date Initiated: 10/21/2021 Target Resolution Date: 02/24/2022 Goal Status: Active Interventions: Assess patient/caregiver ability to obtain necessary supplies Assess patient/caregiver ability to perform ulcer/skin care regimen upon admission and as needed Assess ulceration(s) every visit Provide education on ulcer and skin care ASTOU, LADA ([id number]) [id number] Page 6 of 9 Treatment  Activities: Topical wound management initiated : 10/21/2021 Notes: Electronic Signature(s) Signed: 01/13/2022 4:45:06 PM By: Rhae Hammock RN Entered By: Rhae Hammock on 01/13/2022 13:17:42 -------------------------------------------------------------------------------- Pain Assessment Details Patient Name: Date of Service: [name] [name] Bridges, [name] 01/13/2022 1:15 PM Medical Record Number: [id number] Patient Account Number: [id number] Date of Birth/Sex: Treating RN: Oct 10, 1940 (81 y.o. [name] Primary Care Minsa Weddington: [name] Bridges Other Clinician: Referring Moya Duan: Treating Mikena Masoner/Extender: [name] Bridges in Treatment: 12 Active Problems Location of Pain Severity and Description of Pain Patient Has Paino Yes Site Locations Pain Location: Generalized Pain Rate the pain. Current Pain Level: 6 Pain Management and Medication Current Pain Management: Medication: No Cold Application: No Rest: No Massage: No Activity: No T.E.N.S.: No Heat Application: No Leg drop or elevation: No Is the Current Pain Management Adequate: Adequate How does your wound impact your activities of daily livingo Sleep: No Bathing: No Appetite: No Relationship With Others: No Bladder Continence: No Emotions: No Bowel Continence: No Work: No Toileting: No  Drive: No Dressing: No Hobbies: No Notes pain the the knees arthritis, per patient. Electronic Signature(s) Signed: 01/13/2022 4:27:51 PM By: [name] Pilling RN, BSN Entered By: [name] Bridges on 01/13/2022 13:05:18 [name] Bridges ([id number]) [id number] Page 7 of 9 -------------------------------------------------------------------------------- Patient/Caregiver Education Details Patient Name: Date of Service: [name] Bridges 11/20/2023andnbsp1:15 PM Medical Record Number: [id number] Patient Account Number: [id number] Date of Birth/Gender: Treating RN: 22-Jan-1941 (81  y.o. [name] Bridges, [name] Bridges Primary Care Physician: [name] Bridges Other Clinician: Referring Physician: Treating Physician/Extender: [name] Bridges in Treatment: 12 Education Assessment Education Provided To: Patient Education Topics Provided Venous: Methods: Explain/Verbal Responses: Reinforcements needed, State content correctly Electronic Signature(s) Signed: 01/13/2022 4:45:06 PM By: Rhae Hammock RN Entered By: Rhae Hammock on 01/13/2022 13:22:31 -------------------------------------------------------------------------------- Wound Assessment Details Patient Name: Date of Service: [name] [name] Bridges, [name] 01/13/2022 1:15 PM Medical Record Number: [id number] Patient Account Number: [id number] Date of Birth/Sex: Treating RN: 1940/04/06 (81 y.o. [name] Bridges, Meta.Reding Primary Care Tamzin Bertling: [name] Bridges Other Clinician: Referring Marcelene Weidemann: Treating Tucker Steedley/Extender: [name] Bridges in Treatment: 12 Wound Status Wound Number: 1 Primary Lymphedema Etiology: Wound Location: Right, Circumferential Lower Leg Wound Open Wounding Event: Gradually Appeared Status: Date Acquired: 07/29/2021 Comorbid Cataracts, Lymphedema, Arrhythmia, Hypertension, Peripheral Weeks Of Treatment: 12 History: Venous Disease, Type II Diabetes, Osteoarthritis, Neuropathy Clustered Wound: Yes Wound Measurements Length: (cm) Width: (cm) Depth: (cm) Clustered Quantity: Area: (cm) Volume: (cm) 1.4 % Reduction in Area: 98.8% 1.8 % Reduction in Volume: 99.4% 0.1 Epithelialization: Large (67-100%) 1 Tunneling: No 1.979 Undermining: No 0.198 Wound Description Classification: Full Thickness Without Exposed Sup Wound Margin: Distinct, outline attached Exudate Amount: Medium Exudate Type: Serosanguineous Exudate Color: red, brown port Structures Foul Odor After Cleansing: No Slough/Fibrino No Wound Bed Granulation Amount: Large (67-100%) Exposed  Structure [name] Bridges, [name] L ([id number]) [id number] Page 8 of 9 Granulation Quality: Red, Pink Fascia Exposed: No Necrotic Amount: None Present (0%) Fat Layer (Subcutaneous Tissue) Exposed: Yes Tendon Exposed: No Muscle Exposed: No Joint Exposed: No Bone Exposed: No Periwound Skin Texture Texture Color No Abnormalities Noted: Yes No Abnormalities Noted: No Atrophie Blanche: No Moisture Cyanosis: No No Abnormalities Noted: Yes Ecchymosis: No Erythema: No Hemosiderin Staining: Yes Mottled: No Pallor: No Rubor: No Temperature / Pain Temperature: No Abnormality Assessment Notes no weeping out the right medial lower leg. Treatment Notes Wound #1 (Lower Leg) Wound Laterality: Right, Circumferential Cleanser Soap and Water Discharge Instruction: May shower and wash wound with dial antibacterial soap and water prior to dressing change. Wound Cleanser Discharge Instruction: Cleanse the wound with wound cleanser prior to applying a clean dressing using gauze sponges, not tissue or cotton balls. Peri-Wound Care Triamcinolone 15 (g) Discharge Instruction: Use triamcinolone 15 (g) as directed Sween Lotion (Moisturizing lotion) Discharge Instruction: Apply moisturizing lotion as directed Topical Primary Dressing KerraCel Ag Gelling Fiber Dressing, 4x5 in (silver alginate) Discharge Instruction: Apply silver alginate to wound bed as instructed Secondary Dressing ABD Pad, 8x10 Discharge Instruction: Apply over primary dressing as directed. Zetuvit Plus 4x8 in Discharge Instruction: Apply over primary dressing as directed. Secured With Compression Wrap Kerlix Roll 4.5x3.1 (in/yd) Discharge Instruction: Apply Kerlix and Coban compression as directed. Coban Self-Adherent Wrap 4x5 (in/yd) Discharge Instruction: Apply over Kerlix as directed. Compression Stockings Add-Ons Electronic Signature(s) Signed: 01/13/2022 4:27:51 PM By: [name] Pilling RN,  BSN Entered By: [name] Bridges on 01/13/2022 13:12:18 [name] Bridges ([id number]) [id number] Page 9 of 9 -------------------------------------------------------------------------------- Vitals Details Patient Name: Date of Service: [name] Dayton, [name] RO  [name] 01/13/2022 1:15 PM Medical Record Number: [id number] Patient Account Number: [id number] Date of Birth/Sex: Treating RN: Nov 25, 1940 (81 y.o. [name] Bridges, Meta.Reding Primary Care Stehanie Ekstrom: [name] Bridges Other Clinician: Referring Karri Kallenbach: Treating [place] Frieden/Extender: [name] Bridges in Treatment: 12 Vital Signs Time Taken: 13:10 Temperature (F): 98.3 Height (in): 62 Pulse (bpm): 68 Weight (lbs): 298 Respiratory Rate (breaths/min): 20 Body Mass Index (BMI): 54.5 Blood Pressure (mmHg): 158/77 Reference Range: 80 - 120 mg / dl Electronic Signature(s) Signed: 01/13/2022 4:27:51 PM By: [name] Pilling RN, BSN Entered By: [name] Bridges on 01/13/2022 13:11:45

## 2022-01-13 NOTE — Progress Notes (Signed)
[name] Bridges, [name] Bridges ([id number]) [id number] Page 1 of 8 Visit Report for 01/13/2022 Chief Complaint Document Details Patient Name: Date of Service: [name], [name] 01/13/2022 1:15 PM Medical Record Number: [id number] Patient Account Number: [id number] Date of Birth/Sex: Treating RN: [birth year] (81 y.o. F) Primary Care Provider: [name] Other Clinician: Referring Provider: Treating Provider/Extender: Durwin Reges in Treatment: 12 Information Obtained from: Patient Chief Complaint 10/21/2021; right lower extremity wound Electronic Signature(s) Signed: 01/13/2022 1:38:49 PM By: [name] DO Entered By: [name] on 01/13/2022 13:31:29 -------------------------------------------------------------------------------- HPI Details Patient Name: Date of Service: [name], [name] 01/13/2022 1:15 PM Medical Record Number: [id number] Patient Account Number: [id number] Date of Birth/Sex: Treating RN: 1940-05-09 (81 y.o. F) Primary Care Provider: [name] Other Clinician: Referring Provider: Treating Provider/Extender: Durwin Reges in Treatment: 12 History of Present Illness HPI Description: Admission 10/21/2021 Ms. Marlie Kuennen is an 81 year old female with a past medical history of morbid obesity, insulin-dependent type 2 diabetes, atrial fibrillation on Eliquis and lymphedema/venous insufficiency that presents to the clinic for a right lower extremity wound that has been present for the past 2 months. She has a history of wounds that wax and wane in healing over the past 2 years. She has been following with [facility] for this issue. They have been using Kerlix and Ace bandage along with silver alginate. She is also on torsemide 40 mg daily. She has been taking this dose for the past 5 years. She states she has a lot of drainage from the wound beds and often  requires several dressing changes throughout the day. She has home health that comes out twice weekly. She has lymphedema pumps that she uses twice daily. Currently she denies signs of infection. 9/5; patient presents for follow-up. We have been using PolyMem silver under Kerlix/Coban. She states she tolerated the compression wrap well but felt like it was very tight. Home health has been coming out however they did not have any supplies for the patient and has been using ABD pads with an Ace bandage. She was able to follow-up last week for nurse visit. She is able to make it 24 hours without drainage coming completely through the wrap/ace bandage. She denies signs of infection. 9/12; patient presents for follow-up. We have been using PolyMem silver under Kerlix/Coban. She reports less drainage from the wound bed and can last almost 2 days without a wrap change. 9/19; patient presents for follow-up. We have been using PolyMem silver under Kerlix/Coban. She tried ketoconazole and it improved her symptoms of itching however she still has significant erythema on exam. 9/26; patient presents for follow-up. We have been using PolyMem silver with ketoconazole under Kerlix/Coban. She has no issues with the wrap today. She states she has had this changed every other day by home health. I gave her cephalexin at last clinic visit however she states she broke out in hive. She immediately stopped the medication and had no further issues. She has had cephalosporins in the past without issues. 10/3; patient presents for follow-up. She had a wound culture done at last clinic visit that showed extra high levels of Serratia marcescens, Pseudomonas aeruginosa, Morganella morganii I, Enterococcus faecalis. Keystone antibiotic was ordered. Patient has not been contacted for purchase yet. We have been using calcium alginate under Kerlix/Coban as she does not tolerate a higher compression wrap. 10/10; patient presents for  follow-up. She has been using Keystone antibiotics and has done  this for the past 3 dressing changes. She reports improvement in drainage and a decrease in rednessaround the periwound. We have also been using silver alginate under Kerlix/Coban. 10/17; patient presents for follow-up. She has been using Keystone topical antibiotic spray with silver alginate under Kerlix/Coban. She reports improvement in GRAYCE, BUDDEN ([id number]) [id number] Page 2 of 8 wound healing. She reports a decrease in drainage and her wrap not having to be changed as often. 12/17/2021: Unfortunately, she had a significant increase in drainage that saturated her dressings and skin, despite frequent changes. She also had breakdown of the skin on her upper medial thigh. Her skin is quite erythematous and she has not been able to use her lymphedema pumps on the right due to pain. 10/31; patient presents for follow-up. At last clinic visit she had developed cellulitis and was prescribed levofloxacin. She states that her symptoms have improved greatly. She no longer has erythema to the right lower extremity. She still has drainage. She has been using silver alginate and Keystone antibiotic under Ace bandage. She states that home health has discharged her but the reason is unclear. 11/13; patient presents for follow-up. She has been reapproved for home health. She has been using silver alginate to the wound beds under compression therapy. She does not want to go up on a higher compression from Kerlix/Coban. She changes the dressing daily. 11/20; patient presents for follow-up. She has 1 remaining wound to the anterior aspect of the right lower extremity. I did not notice the area of weeping that she usually has on the posterior aspect of the right leg. Patient has home health that comes out once a week. Electronic Signature(s) Signed: 01/13/2022 1:38:49 PM By: [name] DO Entered By: [name] on 01/13/2022 13:32:24 -------------------------------------------------------------------------------- Physical Exam Details Patient Name: Date of Service: [name], [name] 01/13/2022 1:15 PM Medical Record Number: [id number] Patient Account Number: [id number] Date of Birth/Sex: Treating RN: [birth year] (81 y.o. F) Primary Care Provider: [name] Other Clinician: Referring Provider: Treating Provider/Extender: Durwin Reges in Treatment: 12 Constitutional respirations regular, non-labored and within target range for patient.. Cardiovascular 2+ dorsalis pedis/posterior tibialis pulses. Psychiatric pleasant and cooperative. Notes Right lower extremity: T the anterior aspect there is an open wound with granulation tissue. Venous stasis dermatitis. 2+ pitting edema to the knee. o Electronic Signature(s) Signed: 01/13/2022 1:38:49 PM By: [name] DO Entered By: [name] on 01/13/2022 13:33:59 -------------------------------------------------------------------------------- Physician Orders Details Patient Name: Date of Service: [name], [name] 01/13/2022 1:15 PM Medical Record Number: [id number] Patient Account Number: [id number] Date of Birth/Sex: Treating RN: [birth year] (81 y.o. Benjaman Lobe Primary Care Provider: [name] Other Clinician: Referring Provider: Treating Provider/Extender: Durwin Reges in Treatment: 12 Verbal / Phone Orders: No Diagnosis Coding Follow-up Appointments ppointment in 1 week. - w/ Dr. Heber Dunlevy (Tuesday) Return A Anesthetic NELANI, SCHMELZLE ([id number]) [phone].pdf Page 3 of 8 (In clinic) Topical Lidocaine 5% applied to wound bed (In clinic) Topical Lidocaine 4% applied to wound bed Bathing/ Shower/ Hygiene May shower with protection but do not get wound dressing(s) wet. - Can shower with cast protector bag from [facility] or [place]. Edema Control - Lymphedema / SCD / Other Lymphedema Pumps. Use Lymphedema pumps on leg(s) 2-3 times a day for 45-60 minutes. If wearing any wraps or hose, do not remove them. Continue exercising as instructed. - 2 times a day throughout the day. Elevate legs to the level  of the heart or above for 30 minutes daily and/or when sitting, a frequency of: - throughout the day Avoid standing for long periods of time. Exercise regularly Additional Orders / Instructions Follow Nutritious Diet - Monitor/Control Blood Sugar Home Health New wound care orders this week; continue Home Health for wound care. May utilize formulary equivalent dressing for wound treatment orders unless otherwise specified. - alginate Ag abd pad, kerlix, and coban 2x weekly changes by home health. Wound Treatment Wound #1 - Lower Leg Wound Laterality: Right, Circumferential Cleanser: Soap and Water (Home Health) 2 x Per Week/30 Days Discharge Instructions: May shower and wash wound with dial antibacterial soap and water prior to dressing change. Cleanser: Wound Cleanser (Home Health) 2 x Per Week/30 Days Discharge Instructions: Cleanse the wound with wound cleanser prior to applying a clean dressing using gauze sponges, not tissue or cotton balls. Peri-Wound Care: Triamcinolone 15 (g) 2 x Per Week/30 Days Discharge Instructions: Use triamcinolone 15 (g) as directed Peri-Wound Care: Sween Lotion (Moisturizing lotion) (Home Health) 2 x Per Week/30 Days Discharge Instructions: Apply moisturizing lotion as directed Prim Dressing: KerraCel Ag Gelling Fiber Dressing, 4x5 in (silver alginate) (Home Health) 2 x Per Week/30 Days ary Discharge Instructions: Apply silver alginate to wound bed as instructed Secondary Dressing: ABD Pad, 8x10 (Home Health) 2 x Per Week/30 Days Discharge Instructions: Apply over primary dressing as directed. Secondary Dressing: Zetuvit Plus 4x8 in [facility]) 2 x Per Week/30  Days Discharge Instructions: Apply over primary dressing as directed. Compression Wrap: Kerlix Roll 4.5x3.1 (in/yd) 2 x Per Week/30 Days Discharge Instructions: Apply Kerlix and Coban compression as directed. Compression Wrap: Coban Self-Adherent Wrap 4x5 (in/yd) 2 x Per Week/30 Days Discharge Instructions: Apply over Kerlix as directed. Electronic Signature(s) Signed: 01/13/2022 1:38:49 PM By: [name] DO Entered By: [name] on 01/13/2022 13:34:08 -------------------------------------------------------------------------------- Problem List Details Patient Name: Date of Service: [name], [name] 01/13/2022 1:15 PM Medical Record Number: [id number] Patient Account Number: [id number] Date of Birth/Sex: Treating RN: Sep 30, 1940 (81 y.o. F) Primary Care Provider: [name] Other Clinician: Referring Provider: Treating Provider/Extender: Durwin Reges in Treatment: 12 Active Problems ICD-10 Encounter Code Description Active Date MDM Diagnosis MIRELLE, BISKUP ([id number]) [id number] Page 4 of 8 [phone] Non-pressure chronic ulcer of other part of right lower leg with fat layer 10/21/2021 No Yes exposed I87.311 Chronic venous hypertension (idiopathic) with ulcer of right lower extremity 10/21/2021 No Yes E11.622 Type 2 diabetes mellitus with other skin ulcer 10/21/2021 No Yes I48.91 Unspecified atrial fibrillation 10/21/2021 No Yes Z79.01 Long term (current) use of anticoagulants 10/21/2021 No Yes B37.2 Candidiasis of skin and nail 11/05/2021 No Yes L97.111 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 01/13/2022 1:15 PM Medical Record Number: [id number] Patient Account Number: [id number] Date of Birth/Sex: Treating RN: 24-Dec-1940 (81 y.o. F) Primary Care Provider: [name] Other Clinician: Referring Provider: Treating Provider/Extender: Durwin Reges in Treatment: 12 Subjective Chief Complaint Information obtained from Patient 10/21/2021; right lower extremity wound History of Present Illness (HPI) Admission 10/21/2021 Ms. Sheyanne Munley is an 81 year old female with a past medical history of morbid obesity, insulin-dependent type 2 diabetes, atrial fibrillation on Eliquis and lymphedema/venous insufficiency that presents to the clinic for  a right lower extremity wound that has been present for the past 2 months. She has a history of wounds that wax and wane in healing over the past 2 years. She has been following with [facility] for this issue. They have been using Kerlix and Ace bandage along with silver alginate. She is also on torsemide 40 mg daily. She has been taking this dose for the past 5 years. She states she has a lot of drainage from the wound beds and often requires several dressing changes throughout the day. She has home health that comes out twice weekly. She has lymphedema pumps that she uses twice daily. Currently she denies signs of infection. 9/5; patient presents for follow-up. We have been using PolyMem silver under Kerlix/Coban. She states she tolerated the compression wrap well but felt like it was very tight. Home health has been coming out however they did not have any supplies for the patient and has been using ABD pads with an Ace bandage. She was able to follow-up last week for nurse visit. She is able to make it 24 hours without drainage coming completely through the wrap/ace bandage. She denies signs of infection. 9/12; patient presents for follow-up. We  have been using PolyMem silver under Kerlix/Coban. She reports less drainage from the wound bed and can last almost 2 days without a wrap change. SAFAA, STINGLEY ([id number]) [id number] Page 5 of 8 9/19; patient presents for follow-up. We have been using PolyMem silver under Kerlix/Coban. She tried ketoconazole and it improved her symptoms of itching however she still has significant erythema on exam. 9/26; patient presents for follow-up. We have been using PolyMem silver with ketoconazole under Kerlix/Coban. She has no issues with the wrap today. She states she has had this changed every other day by home health. I gave her cephalexin at last clinic visit however she states she broke out in hive. She immediately stopped the medication and had no further issues. She has had cephalosporins in the past without issues. 10/3; patient presents for follow-up. She had a wound culture done at last clinic visit that showed extra high levels of Serratia marcescens, Pseudomonas aeruginosa, Morganella morganii I, Enterococcus faecalis. Keystone antibiotic was ordered. Patient has not been contacted for purchase yet. We have been using calcium alginate under Kerlix/Coban as she does not tolerate a higher compression wrap. 10/10; patient presents for follow-up. She has been using Keystone antibiotics and has done this for the past 3 dressing changes. She reports improvement in drainage and a decrease in rednessaround the periwound. We have also been using silver alginate under Kerlix/Coban. 10/17; patient presents for follow-up. She has been using Keystone topical antibiotic spray with silver alginate under Kerlix/Coban. She reports improvement in wound healing. She reports a decrease in drainage and her wrap not having to be changed as often. 12/17/2021: Unfortunately, she had a significant increase in drainage that saturated her dressings and skin, despite frequent changes. She also  had breakdown of the skin on her upper medial thigh. Her skin is quite erythematous and she has not been able to use her lymphedema pumps on the right due to pain. 10/31; patient presents for follow-up. At last clinic visit she had developed cellulitis and was prescribed levofloxacin. She states that her symptoms have improved greatly. She no longer has erythema to the right lower extremity. She still has drainage. She has been using silver alginate and Keystone antibiotic under Ace bandage. She states that home health has discharged her  but the reason is unclear. 11/13; patient presents for follow-up. She has been reapproved for home health. She has been using silver alginate to the wound beds under compression therapy. She does not want to go up on a higher compression from Kerlix/Coban. She changes the dressing daily. 11/20; patient presents for follow-up. She has 1 remaining wound to the anterior aspect of the right lower extremity. I did not notice the area of weeping that she usually has on the posterior aspect of the right leg. Patient has home health that comes out once a week. Patient History Information obtained from Patient. Family History Diabetes - Siblings,Mother, Heart Disease - Mother, Hypertension - Mother, Kidney Disease - Mother, Stroke - Father. Social History Never smoker, Marital Status - Married, Alcohol Use - Never, Drug Use - No History, Caffeine Use - Rarely. Medical History Eyes Patient has history of Cataracts Hematologic/Lymphatic Patient has history of Lymphedema Cardiovascular Patient has history of Arrhythmia - A fib, Hypertension, Peripheral Venous Disease Endocrine Patient has history of Type II Diabetes Musculoskeletal Patient has history of Osteoarthritis Neurologic Patient has history of Neuropathy Medical A Surgical History Notes nd Genitourinary CKD Objective Constitutional respirations regular, non-labored and within target range for  patient.. Vitals Time Taken: 1:10 PM, Height: 62 in, Weight: 298 lbs, BMI: 54.5, Temperature: 98.3 F, Pulse: 68 bpm, Respiratory Rate: 20 breaths/min, Blood Pressure: 158/77 mmHg. Cardiovascular 2+ dorsalis pedis/posterior tibialis pulses. Psychiatric pleasant and cooperative. General Notes: Right lower extremity: T the anterior aspect there is an open wound with granulation tissue. Venous stasis dermatitis. 2+ pitting edema to the o knee. Integumentary (Hair, Skin) Wound #1 status is Open. Original cause of wound was Gradually Appeared. The date acquired was: 07/29/2021. The wound has been in treatment 12 weeks. The wound is located on the Right,Circumferential Lower Leg. The wound measures 1.4cm length x 1.8cm width x 0.1cm depth; 1.979cm^2 area and 0.198cm^3 Gadomski, Devone L ([id number]) [id number] Page 6 of 8 volume. There is Fat Layer (Subcutaneous Tissue) exposed. There is no tunneling or undermining noted. There is a medium amount of serosanguineous drainage noted. The wound margin is distinct with the outline attached to the wound base. There is large (67-100%) red, pink granulation within the wound bed. There is no necrotic tissue within the wound bed. The periwound skin appearance had no abnormalities noted for texture. The periwound skin appearance had no abnormalities noted for moisture. The periwound skin appearance exhibited: Hemosiderin Staining. The periwound skin appearance did not exhibit: Atrophie Blanche, Cyanosis, Ecchymosis, Mottled, Pallor, Rubor, Erythema. Periwound temperature was noted as No Abnormality. General Notes: no weeping out the right medial lower leg. 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 She has 1 remaining. She states she still has weeping to the posterior aspect. Fortunately I did not see active weeping today. I recommended continuing with silver alginate and compression therapy. Follow-up in 1 week. Plan Follow-up Appointments: Return Appointment in 1 week. - w/ Dr. Heber [place]ston (Tuesday) Anesthetic: (In clinic) Topical Lidocaine 5% applied to wound bed (In clinic) Topical Lidocaine 4% applied to wound bed Bathing/ Shower/ Hygiene: May shower with protection but do not get wound dressing(s) wet. -  Can shower with cast protector bag from [facility], [facility] or [place]. Edema Control - Lymphedema / SCD / Other: Lymphedema Pumps. Use Lymphedema pumps on leg(s) 2-3 times a day for 45-60 minutes. If wearing any wraps or hose, do not remove them. Continue exercising as instructed. - 2 times a day throughout the day. Elevate legs to the level of the heart or above for 30 minutes daily and/or when sitting, a frequency of: - throughout the day Avoid standing for long periods of time. Exercise regularly Additional Orders / Instructions: Follow Nutritious Diet - Monitor/Control Blood Sugar Home Health: New wound care orders this week; continue Home Health for wound care. May utilize formulary equivalent dressing for wound treatment orders unless otherwise specified. - alginate Ag abd pad, kerlix, and coban 2x weekly changes by home health. WOUND #1: - Lower Leg Wound Laterality: Right, Circumferential Cleanser: Soap and Water (Home Health) 2 x Per Week/30 Days Discharge Instructions: May shower and wash wound with dial antibacterial soap and water prior to dressing change. Cleanser: Wound Cleanser (Home Health) 2 x Per Week/30 Days Discharge Instructions: Cleanse the wound with wound cleanser prior to applying  a clean dressing using gauze sponges, not tissue or cotton balls. Peri-Wound Care: Triamcinolone 15 (g) 2 x Per Week/30 Days Discharge Instructions: Use triamcinolone 15 (g) as directed Peri-Wound Care: Sween Lotion (Moisturizing lotion) (Home Health) 2 x Per Week/30 Days Discharge Instructions: Apply moisturizing lotion as directed Prim Dressing: KerraCel Ag Gelling Fiber Dressing, 4x5 in (silver alginate) (Home Health) 2 x Per Week/30 Days ary Discharge Instructions: Apply silver alginate to wound bed as instructed Secondary Dressing: ABD Pad, 8x10 (Home Health) 2 x Per Week/30 Days Discharge Instructions: Apply over primary dressing as directed. Secondary Dressing: Zetuvit Plus 4x8 in [facility]) 2 x Per Week/30 Days Discharge Instructions: Apply over primary dressing as directed. Com pression Wrap: Kerlix Roll 4.5x3.1 (in/yd) 2 x Per Week/30 Days Discharge Instructions: Apply Kerlix and Coban compression as directed. Com pression Wrap: Coban Self-Adherent Wrap 4x5 (in/yd) 2 x Per Week/30 Days Discharge Instructions: Apply over Kerlix as directed. 1. Silver alginate under Kerlix/Coban 2. Follow-up in 1 week Electronic Signature(s) Signed: 01/13/2022 1:38:49 PM By: [name] DO Entered By: [name] on 01/13/2022 13:35:07 Canova, Tobias Alexander ([id number]) [id number] Page 7 of 8 -------------------------------------------------------------------------------- HxROS Details Patient Name: Date of ServiceRush Landmark, [name] 01/13/2022 1:15 PM Medical Record Number: [id number] Patient Account Number: [id number] Date of Birth/Sex: Treating RN: 02-21-1941 (81 y.o. F) Primary Care Provider: [name] Other Clinician: Referring Provider: Treating Provider/Extender: Durwin Reges in Treatment: 12 Information Obtained From Patient Eyes Medical History: Positive for: Cataracts Hematologic/Lymphatic Medical  History: Positive for: Lymphedema Cardiovascular Medical History: Positive for: Arrhythmia - A fib; Hypertension; Peripheral Venous Disease Endocrine Medical History: Positive for: Type II Diabetes Time with diabetes: 30 years Treated with: Insulin Blood sugar tested every day: Yes Tested : 3x day Genitourinary Medical History: Past Medical History Notes: CKD Musculoskeletal Medical History: Positive for: Osteoarthritis Neurologic Medical History: Positive for: Neuropathy HBO Extended History Items Eyes: Cataracts Immunizations Pneumococcal Vaccine: Received Pneumococcal Vaccination: Yes Received Pneumococcal Vaccination On or After 60th Birthday: Yes Implantable Devices None Family and Social History Diabetes: Yes - Siblings,Mother; Heart Disease: Yes - Mother; Hypertension: Yes - Mother; Kidney Disease: Yes - Mother; Stroke: Yes - Father; Never smoker; Marital Status - Married; Alcohol Use: Never; Drug Use: No History; Caffeine Use: Rarely; Financial Concerns: No; Food, Clothing or Shelter Needs: No; Support  System Lacking: No; Transportation Concerns: No ROSHANA, SHUFFIELD ([id number]) [phone].pdf Page 8 of 8 Electronic Signature(s) Signed: 01/13/2022 1:38:49 PM By: [name] DO Entered By: [name] on 01/13/2022 13:32:32 -------------------------------------------------------------------------------- SuperBill Details Patient Name: Date of Service: [name], [name] 01/13/2022 Medical Record Number: [id number] Patient Account Number: [id number] Date of Birth/Sex: Treating RN: Oct 01, 1940 (81 y.o. 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

## 2022-01-21 ENCOUNTER — Encounter (HOSPITAL_BASED_OUTPATIENT_CLINIC_OR_DEPARTMENT_OTHER): Payer: Medicare HMO | Admitting: Internal Medicine

## 2022-01-21 DIAGNOSIS — L97812 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d: Secondary | ICD-10-CM | POA: Diagnosis not present

## 2022-01-21 DIAGNOSIS — E11622 Type 2 diabetes mellitus with other skin ulcer: Secondary | ICD-10-CM | POA: Diagnosis not present

## 2022-01-21 DIAGNOSIS — I87311 Chronic venous hypertension (idiopathic) with ulcer of right lower extremity: Secondary | ICD-10-CM | POA: Diagnosis not present

## 2022-01-21 NOTE — Progress Notes (Signed)
[name] Bridges ([id number]) [id number] Page 1 of 8 Visit Report for Bridges Arrival Information Details Patient Name: Date of Service: [name] Bridges. Bridges 10:45 A M Medical Record Number: [id number] Patient Account Number: [id number] Date of Birth/Sex: Treating RN: 06-Dec-1940 (81 y.o. F) Primary Care [name] Bridges: [name] Bridges Other Clinician: Referring [name] Bridges: Treating [name] Bridges/Extender: [name] Bridges in Treatment: 13 Visit Information History Since Last Visit Added or deleted any medications: No Patient Arrived: [name] Bridges Any new allergies or adverse reactions: No Arrival Time: 10:39 Had a fall or experienced change in No Accompanied By: husband activities of daily living that may affect Transfer Assistance: None risk of falls: Patient Identification Verified: Yes Signs or symptoms of abuse/neglect since last visito No Secondary Verification Process Completed: Yes Hospitalized since last visit: No Patient Requires Transmission-Based Precautions: No Implantable device outside of the clinic excluding No Patient Has Alerts: Yes cellular tissue based products placed in the center Patient Alerts: Patient on Blood Thinner since last visit: Has Dressing in Place as Prescribed: Yes Pain Present Now: No Electronic Signature(s) Signed: 01/21/2022 4:06:07 PM By: [name] Bridges 10:40:11 -------------------------------------------------------------------------------- Clinic Level of Care Assessment Details Patient Name: Date of Service: [name] Bridges. Bridges 10:45 A M Medical Record Number: [id number] Patient Account Number: [id number] Date of Birth/Sex: Treating RN: 09-20-1940 (81 y.o. [name] Primary Care Sosie Gato: [name] Bridges Other Clinician: Referring Nikeisha Klutz: Treating Euclid Cassetta/Extender: [name] Bridges in Treatment:  13 Clinic Level of Care Assessment Items TOOL 4 Quantity Score X- 1 0 Use when only an EandM is performed on FOLLOW-UP visit ASSESSMENTS - Nursing Assessment / Reassessment X- 1 10 Reassessment of Co-morbidities (includes updates in patient status) X- 1 5 Reassessment of Adherence to Treatment Plan ASSESSMENTS - Wound and Skin A ssessment / Reassessment X - Simple Wound Assessment / Reassessment - one wound 1 5 '[]'$  - 0 Complex Wound Assessment / Reassessment - multiple wounds X- 1 10 Dermatologic / Skin Assessment (not related to wound area) ASSESSMENTS - Focused Assessment X- 1 5 Circumferential Edema Measurements - multi extremities '[]'$  - 0 Nutritional Assessment / Counseling / Intervention [name] Bridges, [name] Bridges ([id number]) [id number] Page 2 of 8 '[]'$  - 0 Lower Extremity Assessment (monofilament, tuning fork, pulses) '[]'$  - 0 Peripheral Arterial Disease Assessment (using hand held doppler) ASSESSMENTS - Ostomy and/or Continence Assessment and Care '[]'$  - 0 Incontinence Assessment and Management '[]'$  - 0 Ostomy Care Assessment and Management (repouching, etc.) PROCESS - Coordination of Care X - Simple Patient / Family Education for ongoing care 1 15 '[]'$  - 0 Complex (extensive) Patient / Family Education for ongoing care X- 1 10 Staff obtains Programmer, systems, Records, T Results / Process Orders est '[]'$  - 0 Staff telephones HHA, Nursing Homes / Clarify orders / etc '[]'$  - 0 Routine Transfer to another Facility (non-emergent condition) '[]'$  - 0 Routine Hospital Admission (non-emergent condition) '[]'$  - 0 New Admissions / Biomedical engineer / Ordering NPWT Apligraf, etc. , '[]'$  - 0 Emergency Hospital Admission (emergent condition) X- 1 10 Simple Discharge Coordination '[]'$  - 0 Complex (extensive) Discharge Coordination PROCESS - Special Needs '[]'$  - 0 Pediatric / Minor Patient Management '[]'$  - 0 Isolation Patient Management '[]'$  - 0 Hearing / Language / Visual  special needs '[]'$  - 0 Assessment of Community assistance (transportation, D/C planning, etc.) '[]'$  - 0 Additional assistance / Altered mentation '[]'$  - 0 Support Surface(s) Assessment (bed, cushion, seat, etc.) INTERVENTIONS - Wound  Cleansing / Measurement X - Simple Wound Cleansing - one wound 1 5 '[]'$  - 0 Complex Wound Cleansing - multiple wounds X- 1 5 Wound Imaging (photographs - any number of wounds) '[]'$  - 0 Wound Tracing (instead of photographs) X- 1 5 Simple Wound Measurement - one wound '[]'$  - 0 Complex Wound Measurement - multiple wounds INTERVENTIONS - Wound Dressings '[]'$  - 0 Small Wound Dressing one or multiple wounds '[]'$  - 0 Medium Wound Dressing one or multiple wounds '[]'$  - 0 Large Wound Dressing one or multiple wounds '[]'$  - 0 Application of Medications - topical '[]'$  - 0 Application of Medications - injection INTERVENTIONS - Miscellaneous '[]'$  - 0 External ear exam '[]'$  - 0 Specimen Collection (cultures, biopsies, blood, body fluids, etc.) '[]'$  - 0 Specimen(s) / Culture(s) sent or taken to Lab for analysis '[]'$  - 0 Patient Transfer (multiple staff / Civil Service fast streamer / Similar devices) '[]'$  - 0 Simple Staple / Suture removal (25 or less) '[]'$  - 0 Complex Staple / Suture removal (26 or more) '[]'$  - 0 Hypo / Hyperglycemic Management (close monitor of Blood Glucose) [name] Bridges ([id number]) [id number] Page 3 of 8 '[]'$  - 0 Ankle / Brachial Index (ABI) - do not check if billed separately X- 1 5 Vital Signs Has the patient been seen at the hospital within the last three years: Yes Total Score: 90 Level Of Care: New/Established - Level 3 Electronic Signature(s) Signed: 01/21/2022 4:47:08 PM By: [name] Pilling RN, BSN Entered By: [name] Bridges on Bridges 11:15:31 -------------------------------------------------------------------------------- Encounter Discharge Information Details Patient Name: Date of Service: [name] Bridges. Bridges 10:45 A  M Medical Record Number: [id number] Patient Account Number: [id number] Date of Birth/Sex: Treating RN: 1940-11-11 (81 y.o. [name] Primary Care Sausha Raymond: [name] Bridges Other Clinician: Referring Rihaan Barrack: Treating Zaxton Angerer/Extender: [name] Bridges in Treatment: 13 Encounter Discharge Information Items Discharge Condition: Stable Ambulatory Status: Walker Discharge Destination: Home Transportation: Private Auto Accompanied By: self Schedule Follow-up Appointment: Yes Clinical Summary of Care: Notes tubigrip size E double layer to right leg. Electronic Signature(s) Signed: 01/21/2022 4:47:08 PM By: [name] Pilling RN, BSN Entered By: [name] Bridges on Bridges 11:16:13 -------------------------------------------------------------------------------- Lower Extremity Assessment Details Patient Name: Date of Service: [name] Bridges. Bridges 10:45 A M Medical Record Number: [id number] Patient Account Number: [id number] Date of Birth/Sex: Treating RN: 1940/12/06 (81 y.o. F) Primary Care Welton Bord: [name] Bridges Other Clinician: Referring Oneal Schoenberger: Treating Kagan Hietpas/Extender: [name] Bridges in Treatment: 13 Edema Assessment Assessed: Shirlyn Goltz: No] Patrice Paradise: No] Edema: [Left: Ye] [Right: s] Calf Left: Right: Point of Measurement: 24 cm From Medial Instep 50 cm Ankle Left: Right: Point of Measurement: 7 cm From Medial Instep 24.5 cm Gurka, Catlyn Bridges ([id number]) [id number] Page 4 of 8 Vascular Assessment Pulses: Dorsalis Pedis Palpable: [Right:Yes] Electronic Signature(s) Signed: 01/21/2022 4:06:07 PM By: [name] Bridges 10:47:57 -------------------------------------------------------------------------------- Multi Wound Chart Details Patient Name: Date of Service: [name] [place], [place] [name] Bridges. Bridges 10:45 A M Medical Record Number: [id number] Patient  Account Number: [id number] Date of Birth/Sex: Treating RN: 1940-04-07 (81 y.o. F) Primary Care Maleek Craver: [name] Bridges Other Clinician: Referring Masao Junker: Treating Guerry Covington/Extender: [name] Bridges in Treatment: 13 Vital Signs Height(in): 62 Pulse(bpm): 9 Weight(lbs): 706 Blood Pressure(mmHg): 164/69 Body Mass Index(BMI): 54.5 Temperature(F): 97.9 Respiratory Rate(breaths/min): 20 [1:Photos:] [N/A:N/A] Right, Circumferential Lower Leg N/A N/A Wound Location: Gradually Appeared N/A N/A Wounding Event: Lymphedema N/A N/A Primary Etiology: Cataracts, Lymphedema, Arrhythmia,  N/A N/A Comorbid History: Hypertension, Peripheral Venous Disease, Type II Diabetes, Osteoarthritis, Neuropathy 07/29/2021 N/A N/A Date Acquired: 21 N/A N/A Weeks of Treatment: Healed - Epithelialized N/A N/A Wound Status: No N/A N/A Wound Recurrence: [name] Bridges, [name] Bridges ([id number]) [id number] Page 5 of 8 Yes N/A N/A Clustered Wound: 1 N/A N/A Clustered Quantity: 0x0x0 N/A N/A Measurements Bridges x W x D (cm) 0 N/A N/A A (cm) : rea 0 N/A N/A Volume (cm) : 100.00% N/A N/A % Reduction in A rea: 100.00% N/A N/A % Reduction in Volume: Full Thickness Without Exposed N/A N/A Classification: Support Structures Medium N/A N/A Exudate Amount: Serosanguineous N/A N/A Exudate Type: red, brown N/A N/A Exudate Color: Distinct, outline attached N/A N/A Wound Margin: None Present (0%) N/A N/A Granulation Amount: None Present (0%) N/A N/A Necrotic Amount: Fat Layer (Subcutaneous Tissue): Yes N/A N/A Exposed Structures: Fascia: No Tendon: No Muscle: No Joint: No Bone: No Large (67-100%) N/A N/A Epithelialization: Excoriation: No N/A N/A Periwound Skin Texture: Induration: No Callus: No Crepitus: No Rash: No Scarring: No Dry/Scaly: Yes N/A N/A Periwound Skin Moisture: Maceration: No Atrophie Blanche: No N/A N/A Periwound Skin  Color: Cyanosis: No Ecchymosis: No Erythema: No Hemosiderin Staining: No Mottled: No Pallor: No Rubor: No No Abnormality N/A N/A Temperature: Treatment Notes Electronic Signature(s) Signed: 01/21/2022 3:09:28 PM By: [name] DO Entered By: [name] on Bridges 11:49:16 -------------------------------------------------------------------------------- Multi-Disciplinary Care Plan Details Patient Name: Date of Service: [name] [place], [place] [name] Bridges. Bridges 10:45 A M Medical Record Number: [id number] Patient Account Number: [id number] Date of Birth/Sex: Treating RN: [birth year] (81 y.o. [name] Primary Care Jawara Latorre: [name] Bridges Other Clinician: Referring Josiane Labine: Treating Jahniah Pallas/Extender: [name] Bridges in Treatment: 13 Active Inactive Electronic Signature(s) Signed: 01/21/2022 4:47:08 PM By: [name] Pilling RN, BSN Entered By: [name] Bridges on Bridges 11:14:51 [name] Bridges ([id number]) [id number] Page 6 of 8 -------------------------------------------------------------------------------- Pain Assessment Details Patient Name: Date of Service: [name] Bridges. Bridges 10:45 A M Medical Record Number: [id number] Patient Account Number: [id number] Date of Birth/Sex: Treating RN: 10-08-1940 (81 y.o. F) Primary Care Brynlei Klausner: [name] Bridges Other Clinician: Referring Emmarie Sannes: Treating Drequan Ironside/Extender: [name] Bridges in Treatment: 13 Active Problems Location of Pain Severity and Description of Pain Patient Has Paino No Site Locations Pain Management and Medication Current Pain Management: Electronic Signature(s) Signed: 01/21/2022 4:06:07 PM By: [name] Bridges 10:45:34 -------------------------------------------------------------------------------- Patient/Caregiver Education Details Patient Name: Date of Service: [name] [place], [place] [name] Bridges. 11/28/2023andnbsp10:45 A M Medical Record Number: [id number] Patient Account Number: [id number] Date of Birth/Gender: Treating RN: 1940/08/13 (81 y.o. [name] Primary Care Physician: [name] Bridges Other Clinician: Referring Physician: Treating Physician/Extender: [name] Bridges in Treatment: 13 Education Assessment Education Provided To: Patient Education Topics Provided Wound/Skin Impairment: Handouts: Skin Care Do's and Dont's Methods: Explain/Verbal Responses: Reinforcements needed Electronic Signature(s) Signed: 01/21/2022 4:47:08 PM By: [name] Pilling RN, BSN [name] Bridges ([id number]) [id number] Page 7 of 8 Entered By: [name] Bridges on Bridges 11:15:00 -------------------------------------------------------------------------------- Wound Assessment Details Patient Name: Date of Service: [name] Bridges. Bridges 10:45 A M Medical Record Number: [id number] Patient Account Number: [id number] Date of Birth/Sex: Treating RN: 1940/09/02 (81 y.o. [name] Primary Care Guillaume Weninger: [name] Bridges Other Clinician: Referring Juanjose Mojica: Treating Adrik Khim/Extender: [name] Bridges in Treatment: 13 Wound Status Wound Number: 1 Primary Lymphedema Etiology: Wound Location: Right, Circumferential Lower Leg Wound Healed - Epithelialized Wounding  Event: Gradually Appeared Status: Date Acquired: 07/29/2021 Comorbid Cataracts, Lymphedema, Arrhythmia, Hypertension, Peripheral Weeks Of Treatment: 13 History: Venous Disease, Type II Diabetes, Osteoarthritis, Neuropathy Clustered Wound: Yes Photos Wound Measurements Length: (cm) Width: (cm) Depth: (cm) Clustered Quantity: Area: (cm) Volume: (cm) 0 % Reduction in Area: 100% 0 % Reduction in Volume: 100% 0 Epithelialization: Large (67-100%) 1 Tunneling: No 0 Undermining: No 0 Wound Description Classification:  Full Thickness Without Exposed Sup Wound Margin: Distinct, outline attached Exudate Amount: Medium Exudate Type: Serosanguineous Exudate Color: red, brown port Structures Foul Odor After Cleansing: No Slough/Fibrino No Wound Bed Granulation Amount: None Present (0%) Exposed Structure Necrotic Amount: None Present (0%) Fascia Exposed: No Fat Layer (Subcutaneous Tissue) Exposed: Yes Tendon Exposed: No Muscle Exposed: No Joint Exposed: No Bone Exposed: No Periwound Skin Texture Texture Color No Abnormalities Noted: Yes No Abnormalities Noted: No Atrophie Blanche: No Moisture Cyanosis: No No Abnormalities Noted: No Ecchymosis: No Dry / Scaly: Yes Erythema: No Maceration: No Hemosiderin Staining: No Mottled: No [name] Bridges, [name] Bridges ([id number]) [id number] Page 8 of 8 Pallor: No Rubor: No Temperature / Pain Temperature: No Abnormality Electronic Signature(s) Signed: 01/21/2022 4:47:08 PM By: [name] Pilling RN, BSN Entered By: [name] Bridges on Bridges 11:12:18 -------------------------------------------------------------------------------- Vitals Details Patient Name: Date of Service: [name] Bridges. Bridges 10:45 A M Medical Record Number: [id number] Patient Account Number: [id number] Date of Birth/Sex: Treating RN: 1940/06/14 (81 y.o. F) Primary Care Macky Galik: [name] Bridges Other Clinician: Referring Casmira Cramer: Treating Ramy Greth/Extender: [name] Bridges in Treatment: 13 Vital Signs Time Taken: 10:45 Temperature (F): 97.9 Height (in): 62 Pulse (bpm): 69 Weight (lbs): 298 Respiratory Rate (breaths/min): 20 Body Mass Index (BMI): 54.5 Blood Pressure (mmHg): 164/69 Reference Range: 80 - 120 mg / dl Electronic Signature(s) Signed: 01/21/2022 4:06:07 PM By: [name] Bridges 10:45:23

## 2022-01-21 NOTE — Progress Notes (Signed)
BAILIE, CHRISTENBURY ([id number]) [id number] Page 1 of 8 Visit Report for 01/21/2022 Chief Complaint Document Details Patient Name: Date of Service: [name], [place] [name]. 01/21/2022 10:45 A M Medical Record Number: [id number] Patient Account Number: [id number] Date of Birth/Sex: Treating RN: 26-Jul-1940 (81 y.o. F) Primary Care Provider: [name] Other Clinician: Referring Provider: Treating Provider/Extender: Durwin Reges in Treatment: 13 Information Obtained from: Patient Chief Complaint 10/21/2021; right lower extremity wound Electronic Signature(s) Signed: 01/21/2022 3:09:28 PM By: [name] DO Entered By: [name] on 01/21/2022 11:49:30 -------------------------------------------------------------------------------- HPI Details Patient Name: Date of Service: [name], [name] 01/21/2022 10:45 A M Medical Record Number: [id number] Patient Account Number: [id number] Date of Birth/Sex: Treating RN: March 28, 1940 (81 y.o. F) Primary Care Provider: [name] Other Clinician: Referring Provider: Treating Provider/Extender: Durwin Reges in Treatment: 13 History of Present Illness HPI Description: Admission 10/21/2021 Ms. [name] Bridges is an 81 year old female with a past medical history of morbid obesity, insulin-dependent type 2 diabetes, atrial fibrillation on Eliquis and lymphedema/venous insufficiency that presents to the clinic for a right lower extremity wound that has been present for the past 2 months. She has a history of wounds that wax and wane in healing over the past 2 years. She has been following with [facility] for this issue. They have been using Kerlix and Ace bandage along with silver alginate. She is also on torsemide 40 mg daily. She has been taking this dose for the past 5 years. She states she has a lot of drainage from the wound beds and often  requires several dressing changes throughout the day. She has home health that comes out twice weekly. She has lymphedema pumps that she uses twice daily. Currently she denies signs of infection. 9/5; patient presents for follow-up. We have been using PolyMem silver under Kerlix/Coban. She states she tolerated the compression wrap well but felt like it was very tight. Home health has been coming out however they did not have any supplies for the patient and has been using ABD pads with an Ace bandage. She was able to follow-up last week for nurse visit. She is able to make it 24 hours without drainage coming completely through the wrap/ace bandage. She denies signs of infection. 9/12; patient presents for follow-up. We have been using PolyMem silver under Kerlix/Coban. She reports less drainage from the wound bed and can last almost 2 days without a wrap change. 9/19; patient presents for follow-up. We have been using PolyMem silver under Kerlix/Coban. She tried ketoconazole and it improved her symptoms of itching however she still has significant erythema on exam. 9/26; patient presents for follow-up. We have been using PolyMem silver with ketoconazole under Kerlix/Coban. She has no issues with the wrap today. She states she has had this changed every other day by home health. I gave her cephalexin at last clinic visit however she states she broke out in hive. She immediately stopped the medication and had no further issues. She has had cephalosporins in the past without issues. 10/3; patient presents for follow-up. She had a wound culture done at last clinic visit that showed extra high levels of Serratia marcescens, Pseudomonas aeruginosa, Morganella morganii I, Enterococcus faecalis. Keystone antibiotic was ordered. Patient has not been contacted for purchase yet. We have been using calcium alginate under Kerlix/Coban as she does not tolerate a higher compression wrap. 10/10; patient presents for  follow-up. She has been using Keystone antibiotics and  has done this for the past 3 dressing changes. She reports improvement in drainage and a decrease in rednessaround the periwound. We have also been using silver alginate under Kerlix/Coban. 10/17; patient presents for follow-up. She has been using Keystone topical antibiotic spray with silver alginate under Kerlix/Coban. She reports improvement in [name] Bridges, [name] Bridges ([id number]) [id number] Page 2 of 8 wound healing. She reports a decrease in drainage and her wrap not having to be changed as often. 12/17/2021: Unfortunately, she had a significant increase in drainage that saturated her dressings and skin, despite frequent changes. She also had breakdown of the skin on her upper medial thigh. Her skin is quite erythematous and she has not been able to use her lymphedema pumps on the right due to pain. 10/31; patient presents for follow-up. At last clinic visit she had developed cellulitis and was prescribed levofloxacin. She states that her symptoms have improved greatly. She no longer has erythema to the right lower extremity. She still has drainage. She has been using silver alginate and Keystone antibiotic under Ace bandage. She states that home health has discharged her but the reason is unclear. 11/13; patient presents for follow-up. She has been reapproved for home health. She has been using silver alginate to the wound beds under compression therapy. She does not want to go up on a higher compression from Kerlix/Coban. She changes the dressing daily. 11/20; patient presents for follow-up. She has 1 remaining wound to the anterior aspect of the right lower extremity. I did not notice the area of weeping that she usually has on the posterior aspect of the right leg. Patient has home health that comes out once a week. 11/28; patient presents for follow-up. She states that she has noted no drainage or wounds to her right  lower extremity. The compression wrap replaced on last week stayed on the entire week. She has no open wounds. She has compression stockings. She is currently wearing the left compression stocking. Electronic Signature(s) Signed: 01/21/2022 3:09:28 PM By: [name] DO Entered By: [name] on 01/21/2022 11:50:32 -------------------------------------------------------------------------------- Physical Exam Details Patient Name: Date of Service: [name], [name] 01/21/2022 10:45 A M Medical Record Number: [id number] Patient Account Number: [id number] Date of Birth/Sex: Treating RN: 06-21-1940 (81 y.o. F) Primary Care Provider: [name] Other Clinician: Referring Provider: Treating Provider/Extender: Durwin Reges in Treatment: 13 Constitutional respirations regular, non-labored and within target range for patient.. Cardiovascular 2+ dorsalis pedis/posterior tibialis pulses. Psychiatric pleasant and cooperative. Notes Right lower extremity: No open wounds. No drainage noted. Good edema control. Venous stasis dermatitis noted throughout the entire lower leg. Electronic Signature(s) Signed: 01/21/2022 3:09:28 PM By: [name] DO Entered By: [name] on 01/21/2022 11:51:51 -------------------------------------------------------------------------------- Physician Orders Details Patient Name: Date of Service: [name], [name] 01/21/2022 10:45 A M Medical Record Number: [id number] Patient Account Number: [id number] Date of Birth/Sex: Treating RN: 1940-11-01 (81 y.o. [name] Primary Care Provider: [name] Other Clinician: Referring Provider: Treating Provider/Extender: Durwin Reges in Treatment: 13 Verbal / Phone Orders: No Diagnosis Coding ICD-10 Coding RAYCHELL, HOLCOMB ([id number]) [id number] Page 3 of 8 Code Description [phone] Non-pressure  chronic ulcer of other part of right lower leg with fat layer exposed I87.311 Chronic venous hypertension (idiopathic) with ulcer of right lower extremity E11.622 Type 2 diabetes mellitus with other skin ulcer I48.91 Unspecified atrial fibrillation Z79.01 Long term (current) use of anticoagulants B37.2 Candidiasis of skin and nail L97.111 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 PUMPs for LIFE. Compression stockings FOR LIFE. Edema Control - Lymphedema / SCD / Other Lymphedema Pumps. Use Lymphedema pumps on leg(s) 2-3 times a day for 45-60 minutes. If wearing any wraps or hose, do not remove them. Continue exercising as instructed. - 2 times a day throughout the day. Elevate legs to the level of the heart or above for 30 minutes daily and/or when sitting, a frequency of: - throughout the day Avoid standing for long periods of time. Exercise regularly Moisturize legs daily. - both legs every night before bed. Compression stocking or Garment 30-40 mm/Hg pressure to: - apply in the morning and remove at night. ***Will apply tubigrip size E double layer at clinic.**** Additional Orders / Instructions Follow Nutritious Diet - Monitor/Control Blood Sugar Home Health Other Home Health Orders/Instructions: - Discharge home health. [facility] Electronic Signature(s) Signed: 01/21/2022 3:09:28 PM By: [name] DO Previous Signature: 01/21/2022 11:39:14 AM Version By: [name] DO Entered By: [name] on 01/21/2022 11:52:02 -------------------------------------------------------------------------------- Problem List Details Patient Name: Date of Service: [name], [name] 01/21/2022 10:45 A M Medical Record Number: [id number] Patient Account Number: [id number] Date of Birth/Sex: Treating RN: 12/02/1940 (81 y.o. [name] Primary Care  Provider: [name] Other Clinician: Referring Provider: Treating Provider/Extender: Durwin Reges in Treatment: 13 Active Problems ICD-10 Encounter Code Description Active Date MDM Diagnosis [phone] Non-pressure chronic ulcer of other part of right lower leg with fat layer 10/21/2021 No Yes exposed I87.311 Chronic venous hypertension (idiopathic) with ulcer of right lower extremity 10/21/2021 No Yes E11.622 Type 2 diabetes mellitus with other skin ulcer 10/21/2021 No Yes I48.91 Unspecified atrial fibrillation 10/21/2021 No Yes Z79.01 Long term (current) use of anticoagulants 10/21/2021 No Yes [name] Bridges, [name] Bridges ([id number]) [id number] Page 4 of 8 B37.2 Candidiasis of skin and nail 11/05/2021 No Yes L97.111 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 01/21/2022 10:45 A M Medical Record Number: [id number] Patient Account Number: [id number] Date of Birth/Sex: Treating RN: [birth year] (81 y.o. F) Primary Care Provider: [name] Other Clinician: Referring Provider: Treating Provider/Extender: Durwin Reges in Treatment: 13 Subjective Chief Complaint Information obtained from Patient 10/21/2021; right lower extremity wound History of Present Illness (HPI) Admission 10/21/2021 Ms. Montrice Montuori is an 81 year old female with a past medical history of morbid obesity, insulin-dependent type 2 diabetes, atrial fibrillation on Eliquis and lymphedema/venous insufficiency  that presents to the clinic for a right lower extremity wound that has been present for the past 2 months. She has a history of wounds that wax and wane in healing over the past 2 years. She has been following with [facility] for this issue. They have been using Kerlix and Ace bandage along with silver alginate. She is also on torsemide 40 mg daily. She has been taking this dose for the past 5 years. She states she has a lot of drainage from the wound beds and often requires several dressing changes throughout the day. She has home health that comes out twice  weekly. She has lymphedema pumps that she uses twice daily. Currently she denies signs of infection. 9/5; patient presents for follow-up. We have been using PolyMem silver under Kerlix/Coban. She states she tolerated the compression wrap well but felt like it was very tight. Home health has been coming out however they did not have any supplies for the patient and has been using ABD pads with an Ace bandage. She was able to follow-up last week for nurse visit. She is able to make it 24 hours without drainage coming completely through the wrap/ace bandage. She denies signs of infection. 9/12; patient presents for follow-up. We have been using PolyMem silver under Kerlix/Coban. She reports less drainage from the wound bed and can last almost 2 days without a wrap change. 9/19; patient presents for follow-up. We have been using PolyMem silver under Kerlix/Coban. She tried ketoconazole and it improved her symptoms of itching however she still has significant erythema on exam. 9/26; patient presents for follow-up. We have been using PolyMem silver with ketoconazole under Kerlix/Coban. She has no issues with the wrap today. She states she has had this changed every other day by home health. I gave her cephalexin at last clinic visit however she states she broke out in hive. She immediately stopped the medication and had no further issues.  She has had cephalosporins in the past without issues. 10/3; patient presents for follow-up. She had a wound culture done at last clinic visit that showed extra high levels of Serratia marcescens, Pseudomonas aeruginosa, Morganella morganii I, Enterococcus faecalis. Keystone antibiotic was ordered. Patient has not been contacted for purchase yet. We have been using calcium alginate under Kerlix/Coban as she does not tolerate a higher compression wrap. 10/10; patient presents for follow-up. She has been using Keystone antibiotics and has done this for the past 3 dressing changes. She reports improvement in drainage and a decrease in rednessaround the periwound. We have also been using silver alginate under Kerlix/Coban. 10/17; patient presents for follow-up. She has been using Keystone topical antibiotic spray with silver alginate under Kerlix/Coban. She reports improvement in wound healing. She reports a decrease in drainage and her wrap not having to be changed as often. 12/17/2021: Unfortunately, she had a significant increase in drainage that saturated her dressings and skin, despite frequent changes. She also had breakdown of the skin on her upper medial thigh. Her skin is quite erythematous and she has not been able to use her lymphedema pumps on the right due to pain. 10/31; patient presents for follow-up. At last clinic visit she had developed cellulitis and was prescribed levofloxacin. She states that her symptoms have improved greatly. She no longer has erythema to the right lower extremity. She still has drainage. She has been using silver alginate and Keystone antibiotic [name] Bridges, [name] Bridges ([id number]) [id number] Page 5 of 8 under Ace bandage. She states that home health has discharged her but the reason is unclear. 11/13; patient presents for follow-up. She has been reapproved for home health. She has been using silver alginate to the wound beds under  compression therapy. She does not want to go up on a higher compression from Kerlix/Coban. She changes the dressing daily. 11/20; patient presents for follow-up. She has 1 remaining wound to the anterior aspect of the right lower extremity. I did not notice the area of weeping that she usually has on the posterior aspect of the right leg. Patient has home health that comes out once a week. 11/28; patient presents for follow-up. She  states that she has noted no drainage or wounds to her right lower extremity. The compression wrap replaced on last week stayed on the entire week. She has no open wounds. She has compression stockings. She is currently wearing the left compression stocking. Patient History Information obtained from Patient. Family History Diabetes - Siblings,Mother, Heart Disease - Mother, Hypertension - Mother, Kidney Disease - Mother, Stroke - Father. Social History Never smoker, Marital Status - Married, Alcohol Use - Never, Drug Use - No History, Caffeine Use - Rarely. Medical History Eyes Patient has history of Cataracts Hematologic/Lymphatic Patient has history of Lymphedema Cardiovascular Patient has history of Arrhythmia - A fib, Hypertension, Peripheral Venous Disease Endocrine Patient has history of Type II Diabetes Musculoskeletal Patient has history of Osteoarthritis Neurologic Patient has history of Neuropathy Medical A Surgical History Notes nd Genitourinary CKD Objective Constitutional respirations regular, non-labored and within target range for patient.. Vitals Time Taken: 10:45 AM, Height: 62 in, Weight: 298 lbs, BMI: 54.5, Temperature: 97.9 F, Pulse: 69 bpm, Respiratory Rate: 20 breaths/min, Blood Pressure: 164/69 mmHg. Cardiovascular 2+ dorsalis pedis/posterior tibialis pulses. Psychiatric pleasant and cooperative. General Notes: Right lower extremity: No open wounds. No drainage noted. Good edema control. Venous stasis dermatitis noted  throughout the entire lower leg. Integumentary (Hair, Skin) Wound #1 status is Healed - Epithelialized. Original cause of wound was Gradually Appeared. The date acquired was: 07/29/2021. The wound has been in treatment 13 weeks. The wound is located on the Right,Circumferential Lower Leg. The wound measures 0cm length x 0cm width x 0cm depth; 0cm^2 area and 0cm^3 volume. There is Fat Layer (Subcutaneous Tissue) exposed. There is no tunneling or undermining noted. There is a medium amount of serosanguineous drainage noted. The wound margin is distinct with the outline attached to the wound base. There is no granulation within the wound bed. There is no necrotic tissue within the wound bed. The periwound skin appearance had no abnormalities noted for texture. The periwound skin appearance exhibited: Dry/Scaly. The periwound skin appearance did not exhibit: Maceration, Atrophie Blanche, Cyanosis, Ecchymosis, Hemosiderin Staining, Mottled, Pallor, Rubor, Erythema. Periwound temperature was noted as No Abnormality. 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 She has no open wounds. I recommended compression stockings daily. She may follow-up as needed. Plan Discharge From [facility]: Discharge from [place] - Call if any future wound care needs. PUMPs for LIFE. Compression stockings FOR LIFE. Edema Control - Lymphedema / SCD / Other: Lymphedema Pumps. Use Lymphedema pumps  on leg(s) 2-3 times a day for 45-60 minutes. If wearing any wraps or hose, do not remove them. Continue exercising as instructed. - 2 times a day throughout the day. Elevate legs to the level of the heart or above for 30 minutes daily and/or when sitting, a frequency of: - throughout the day Avoid standing for long periods of time. Exercise regularly Moisturize legs daily. - both legs every night before bed. Compression stocking or Garment 30-40 mm/Hg pressure to: - apply in the morning and remove at night. ***Will apply tubigrip size E double layer at clinic.**** Additional  Orders / Instructions: Follow Nutritious Diet - Monitor/Control Blood Sugar Home Health: Other Home Health Orders/Instructions: - Discharge home health. [facility]. 1. Compression stockings daily 2. Discharge from clinic due to closed wound 3. Follow-up as needed Electronic Signature(s) Signed: 01/21/2022 3:09:28 PM By: [name] DO Entered By: [name] on 01/21/2022 11:54:29 -------------------------------------------------------------------------------- HxROS Details Patient Name: Date of Service: [name], [name] 01/21/2022 10:45 A M Medical Record Number: [id number] Patient Account Number: [id number] Date of Birth/Sex: Treating RN: October 25, 1940 (81 y.o. F) Primary Care Provider: [name] Other Clinician: Referring Provider: Treating Provider/Extender: Durwin Reges in Treatment: 13 Information Obtained From Patient Eyes Medical History: Positive for: Cataracts Hematologic/Lymphatic Medical History: Positive for: Lymphedema Cardiovascular Medical History: Positive for: Arrhythmia - A fib; Hypertension; Peripheral Venous Disease Endocrine Medical History: Positive for: Type II Diabetes Time with diabetes: 30 years Treated with: Insulin TYA, HAUGHEY ([id number]) [id number] Page 7 of 8 Blood sugar tested every day:  Yes Tested : 3x day Genitourinary Medical History: Past Medical History Notes: CKD Musculoskeletal Medical History: Positive for: Osteoarthritis Neurologic Medical History: Positive for: Neuropathy HBO Extended History Items Eyes: Cataracts Immunizations Pneumococcal Vaccine: Received Pneumococcal Vaccination: Yes Received Pneumococcal Vaccination On or After 60th Birthday: Yes Implantable Devices None Family and Social History Diabetes: Yes - Siblings,Mother; Heart Disease: Yes - Mother; Hypertension: Yes - Mother; Kidney Disease: Yes - Mother; Stroke: Yes - Father; Never smoker; Marital Status - Married; Alcohol Use: Never; Drug Use: No History; Caffeine Use: Rarely; Financial Concerns: No; Food, Clothing or Shelter Needs: No; Support System Lacking: No; Transportation Concerns: No Electronic Signature(s) Signed: 01/21/2022 3:09:28 PM By: [name] DO Entered By: [name] on 01/21/2022 11:50:37 -------------------------------------------------------------------------------- SuperBill Details Patient Name: Date of Service: [name], [name] 01/21/2022 Medical Record Number: [id number] Patient Account Number: [id number] Date of Birth/Sex: Treating RN: [birth year] (81 y.o. 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 Facility Procedures :  SYMPHANY, [name] Bridges Code: [id number] Kendall Bridges ([id number]) Description: [place] VISIT-LEV 3 EST PT [id number] Modifier: [id number] Quantity: 1 [id number].pdf Page 8 of 8 Physician Procedures : CPT4 Code Description Modifier [id number] - WC PHYS LEVEL 3 - EST PT ICD-10 Diagnosis Description C14.481 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d I87.311 Chronic venous hypertension (idiopathic) with ulcer  of right lower extremity E11.622 Type 2 diabetes mellitus with other skin ulcer Quantity: 1 Electronic Signature(s) Signed: 01/21/2022 3:09:28 PM By: [name] DO Previous Signature: 01/21/2022 11:39:14 AM Version By: [name] DO Entered By: [name] on 01/21/2022 11:54:53

## 2022-09-24 ENCOUNTER — Ambulatory Visit: Payer: Medicare HMO | Admitting: Podiatry

## 2022-09-29 ENCOUNTER — Encounter: Payer: Self-pay | Admitting: Podiatry

## 2022-09-29 ENCOUNTER — Ambulatory Visit: Payer: Medicare HMO | Admitting: Podiatry

## 2022-09-29 DIAGNOSIS — B351 Tinea unguium: Secondary | ICD-10-CM

## 2022-09-29 DIAGNOSIS — M79674 Pain in right toe(s): Secondary | ICD-10-CM | POA: Diagnosis not present

## 2022-09-29 DIAGNOSIS — M79675 Pain in left toe(s): Secondary | ICD-10-CM | POA: Diagnosis not present

## 2022-09-29 DIAGNOSIS — E119 Type 2 diabetes mellitus without complications: Secondary | ICD-10-CM

## 2022-09-29 DIAGNOSIS — Z794 Long term (current) use of insulin: Secondary | ICD-10-CM

## 2022-09-29 NOTE — Progress Notes (Signed)
This patient returns to my office for at risk foot care.  This patient requires this care by a professional since this patient will be at risk due to having diabetes and lymphedema. This patient is unable to cut nails herself since the patient cannot reach her nails.These nails are painful walking and wearing shoes.  This patient presents for at risk foot care today.  General Appearance  Alert, conversant and in no acute stress.  Vascular  Dorsalis pedis and posterior tibial  pulses are palpable  bilaterally.  Capillary return is within normal limits  bilaterally. Temperature is within normal limits  bilaterally.Lymphedema.  Neurologic  Senn-Weinstein monofilament wire test within normal limits  bilaterally. Muscle power within normal limits bilaterally.  Nails Thick disfigured discolored nails with subungual debris  from hallux to fifth toes bilaterally. No evidence of bacterial infection or drainage bilaterally.  Pincer nails.  Orthopedic  No limitations of motion  feet .  No crepitus or effusions noted.  No bony pathology or digital deformities noted.  Skin  normotropic skin with no porokeratosis noted bilaterally.  No signs of infections or ulcers noted.     Onychomycosis  Pain in right toes  Pain in left toes  Consent was obtained for treatment procedures.   Mechanical debridement of nails 1-5  bilaterally performed with a nail nipper.  Filed with dremel without incident.    Return office visit    3 months                  Told patient to return for periodic foot care and evaluation due to potential at risk complications.   Helane Gunther DPM

## 2022-12-30 ENCOUNTER — Ambulatory Visit: Payer: Medicare HMO | Admitting: Podiatry

## 2022-12-30 ENCOUNTER — Encounter: Payer: Self-pay | Admitting: Podiatry

## 2022-12-30 DIAGNOSIS — Z794 Long term (current) use of insulin: Secondary | ICD-10-CM

## 2022-12-30 DIAGNOSIS — M79674 Pain in right toe(s): Secondary | ICD-10-CM

## 2022-12-30 DIAGNOSIS — M79675 Pain in left toe(s): Secondary | ICD-10-CM

## 2022-12-30 DIAGNOSIS — L603 Nail dystrophy: Secondary | ICD-10-CM | POA: Insufficient documentation

## 2022-12-30 DIAGNOSIS — B351 Tinea unguium: Secondary | ICD-10-CM | POA: Diagnosis not present

## 2022-12-30 DIAGNOSIS — E119 Type 2 diabetes mellitus without complications: Secondary | ICD-10-CM

## 2022-12-30 NOTE — Progress Notes (Signed)
This patient returns to my office for at risk foot care.  This patient requires this care by a professional since this patient will be at risk due to having diabetes and lymphedema. This patient is unable to cut nails herself since the patient cannot reach her nails.These nails are painful walking and wearing shoes.  This patient presents for at risk foot care today. She says she is having pain by touching her nail fragment left foot.  She says shshe had nail surgery years ago and no pain until about 2 weeks ago.  She says she now hs pain to touch on her nail bed after she trimmed the nail herself.  She says she is having pain to touch with some drainage.  She has no pain when she walks.  General Appearance  Alert, conversant and in no acute stress.  Vascular  Dorsalis pedis and posterior tibial  pulses are palpable  bilaterally.  Capillary return is within normal limits  bilaterally. Temperature is within normal limits  bilaterally.Lymphedema.  Neurologic  Senn-Weinstein monofilament wire test within normal limits  bilaterally. Muscle power within normal limits bilaterally.  Nails Thick disfigured discolored nails with subungual debris  from hallux to fifth toes bilaterally. No evidence of bacterial infection or drainage bilaterally.  Pincer nails.  She has proximal nail on her left nail bed.  No signs of infection  noted.  Her left hallux toenails is noted proximally.    Orthopedic  No limitations of motion  feet .  No crepitus or effusions noted.  No bony pathology or digital deformities noted.  Skin  normotropic skin with no porokeratosis noted bilaterally.  No signs of infections or ulcers noted.     Onychomycosis  Pain in right toes  Pain in left toes  Nail Dystrophy Left hallux.  Consent was obtained for treatment procedures.   Mechanical debridement of nails 1-5  bilaterally performed with a nail nipper.  Filed with dremel without incident. Examined her left hallux nail and it appears  intact.  No evidence of infection noted.  Discussed this with this patient and she wants to let the nail declare itself.  Neosporin/DSD applied.  Told to peroxide nail and bandage at home.  She says she will call if the nail worsens.   Return office visit    3 months  for nail care.                Told patient to return for periodic foot care and evaluation due to potential at risk complications.   Helane Gunther DPM

## 2023-04-01 ENCOUNTER — Ambulatory Visit: Payer: Medicare HMO | Admitting: Podiatry

## 2023-04-01 ENCOUNTER — Encounter: Payer: Self-pay | Admitting: Podiatry

## 2023-04-01 DIAGNOSIS — M79674 Pain in right toe(s): Secondary | ICD-10-CM | POA: Diagnosis not present

## 2023-04-01 DIAGNOSIS — M79675 Pain in left toe(s): Secondary | ICD-10-CM | POA: Diagnosis not present

## 2023-04-01 DIAGNOSIS — B351 Tinea unguium: Secondary | ICD-10-CM | POA: Diagnosis not present

## 2023-04-01 DIAGNOSIS — E119 Type 2 diabetes mellitus without complications: Secondary | ICD-10-CM | POA: Diagnosis not present

## 2023-04-01 DIAGNOSIS — Z794 Long term (current) use of insulin: Secondary | ICD-10-CM

## 2023-04-01 NOTE — Progress Notes (Signed)
 This patient returns to my office for at risk foot care.  This patient requires this care by a professional since this patient will be at risk due to having diabetes and lymphedema. This patient is unable to cut nails herself since the patient cannot reach her nails.These nails are painful walking and wearing shoes.  This patient presents for at risk foot care today.   General Appearance  Alert, conversant and in no acute stress.  Vascular  Dorsalis pedis and posterior tibial  pulses are palpable  bilaterally.  Capillary return is within normal limits  bilaterally. Temperature is within normal limits  bilaterally.Lymphedema.  Neurologic  Senn-Weinstein monofilament wire test within normal limits  bilaterally. Muscle power within normal limits bilaterally.  Nails Thick disfigured discolored nails with subungual debris  from hallux to fifth toes bilaterally. No evidence of bacterial infection or drainage bilaterally.  Pincer nails.  She has proximal nail on her left nail left hallux.    Orthopedic  No limitations of motion  feet .  No crepitus or effusions noted.  No bony pathology or digital deformities noted.  Skin  normotropic skin with no porokeratosis noted bilaterally.  No signs of infections or ulcers noted.     Onychomycosis  Pain in right toes  Pain in left toes  Nail Dystrophy Left hallux.  Consent was obtained for treatment procedures.   Mechanical debridement of nails 1-5  bilaterally performed with a nail nipper.  Filed with dremel without incident. Examined her left hallux nail and it appears intact.     Return office visit    3 months  for nail care.                Told patient to return for periodic foot care and evaluation due to potential at risk complications.   Cordella Bold DPM

## 2023-07-01 ENCOUNTER — Encounter: Payer: Self-pay | Admitting: Podiatry

## 2023-07-01 ENCOUNTER — Ambulatory Visit: Payer: Medicare HMO | Admitting: Podiatry

## 2023-07-01 DIAGNOSIS — E119 Type 2 diabetes mellitus without complications: Secondary | ICD-10-CM | POA: Diagnosis not present

## 2023-07-01 DIAGNOSIS — Z794 Long term (current) use of insulin: Secondary | ICD-10-CM

## 2023-07-01 DIAGNOSIS — M79675 Pain in left toe(s): Secondary | ICD-10-CM

## 2023-07-01 DIAGNOSIS — B351 Tinea unguium: Secondary | ICD-10-CM

## 2023-07-01 DIAGNOSIS — M79674 Pain in right toe(s): Secondary | ICD-10-CM | POA: Diagnosis not present

## 2023-07-01 NOTE — Progress Notes (Signed)
 This patient returns to my office for at risk foot care.  This patient requires this care by a professional since this patient will be at risk due to having diabetes and lymphedema. This patient is unable to cut nails herself since the patient cannot reach her nails.These nails are painful walking and wearing shoes.  This patient presents for at risk foot care today.   General Appearance  Alert, conversant and in no acute stress.  Vascular  Dorsalis pedis and posterior tibial  pulses are palpable  bilaterally.  Capillary return is within normal limits  bilaterally. Temperature is within normal limits  bilaterally.Lymphedema.  Neurologic  Senn-Weinstein monofilament wire test within normal limits  bilaterally. Muscle power within normal limits bilaterally.  Nails Thick disfigured discolored nails with subungual debris  from hallux to fifth toes bilaterally. No evidence of bacterial infection or drainage bilaterally.  Pincer nails.  She has proximal nail on her left nail left hallux.    Orthopedic  No limitations of motion  feet .  No crepitus or effusions noted.  No bony pathology or digital deformities noted.  Skin  normotropic skin with no porokeratosis noted bilaterally.  No signs of infections .  Painful heel right foot.   Onychomycosis  Pain in right toes  Pain in left toes  Nail Dystrophy Left hallux.  Consent was obtained for treatment procedures.   Mechanical debridement of nails 1-5  bilaterally performed with a nail nipper.  Filed with dremel without incident.    Return office visit    3 months  for nail care.                Told patient to return for periodic foot care and evaluation due to potential at risk complications.   Ruffin Cotton DPM

## 2023-10-01 ENCOUNTER — Encounter: Payer: Self-pay | Admitting: Podiatry

## 2023-10-01 ENCOUNTER — Ambulatory Visit: Admitting: Podiatry

## 2023-10-01 DIAGNOSIS — M79675 Pain in left toe(s): Secondary | ICD-10-CM | POA: Diagnosis not present

## 2023-10-01 DIAGNOSIS — Z794 Long term (current) use of insulin: Secondary | ICD-10-CM | POA: Diagnosis not present

## 2023-10-01 DIAGNOSIS — B351 Tinea unguium: Secondary | ICD-10-CM | POA: Diagnosis not present

## 2023-10-01 DIAGNOSIS — M79674 Pain in right toe(s): Secondary | ICD-10-CM | POA: Diagnosis not present

## 2023-10-01 DIAGNOSIS — E119 Type 2 diabetes mellitus without complications: Secondary | ICD-10-CM

## 2023-10-01 NOTE — Progress Notes (Signed)
 This patient returns to my office for at risk foot care.  This patient requires this care by a professional since this patient will be at risk due to having diabetes and lymphedema. This patient is unable to cut nails herself since the patient cannot reach her nails.These nails are painful walking and wearing shoes.  This patient presents for at risk foot care today.   General Appearance  Alert, conversant and in no acute stress.  Vascular  Dorsalis pedis and posterior tibial  pulses are palpable  bilaterally.  Capillary return is within normal limits  bilaterally. Temperature is within normal limits  bilaterally.Lymphedema.  Neurologic  Senn-Weinstein monofilament wire test within normal limits  bilaterally. Muscle power within normal limits bilaterally.  Nails Thick disfigured discolored nails with subungual debris  from hallux to fifth toes bilaterally. No evidence of bacterial infection or drainage bilaterally.  Pincer nails.  She has proximal nail on her left nail left hallux.    Orthopedic  No limitations of motion  feet .  No crepitus or effusions noted.  No bony pathology or digital deformities noted.  Skin  normotropic skin with no porokeratosis noted bilaterally.  No signs of infections .  Painful heel right foot.   Onychomycosis  Pain in right toes  Pain in left toes  Nail Dystrophy Left hallux.  Consent was obtained for treatment procedures.   Mechanical debridement of nails 1-5  bilaterally performed with a nail nipper.  Filed with dremel without incident.    Return office visit    3 months  for nail care.                Told patient to return for periodic foot care and evaluation due to potential at risk complications.   Ruffin Cotton DPM

## 2024-01-04 ENCOUNTER — Ambulatory Visit: Admitting: Podiatry

## 2024-01-04 ENCOUNTER — Encounter: Payer: Self-pay | Admitting: Podiatry

## 2024-01-04 DIAGNOSIS — B351 Tinea unguium: Secondary | ICD-10-CM | POA: Diagnosis not present

## 2024-01-04 DIAGNOSIS — Z794 Long term (current) use of insulin: Secondary | ICD-10-CM | POA: Diagnosis not present

## 2024-01-04 DIAGNOSIS — E119 Type 2 diabetes mellitus without complications: Secondary | ICD-10-CM | POA: Diagnosis not present

## 2024-01-04 DIAGNOSIS — M79674 Pain in right toe(s): Secondary | ICD-10-CM | POA: Diagnosis not present

## 2024-01-04 DIAGNOSIS — M79675 Pain in left toe(s): Secondary | ICD-10-CM

## 2024-01-04 NOTE — Progress Notes (Addendum)
 This patient returns to my office for at risk foot care.  This patient requires this care by a professional since this patient will be at risk due to having diabetes and lymphedema. This patient is unable to cut nails herself since the patient cannot reach her nails.These nails are painful walking and wearing shoes.  This patient presents for at risk foot care today.   General Appearance  Alert, conversant and in no acute stress.  Vascular  Dorsalis pedis and posterior tibial  pulses are palpable  bilaterally.  Capillary return is within normal limits  bilaterally. Temperature is within normal limits  bilaterally.Lymphedema.  Neurologic  Senn-Weinstein monofilament wire test within normal limits  bilaterally. Muscle power within normal limits bilaterally.  Nails Thick disfigured discolored nails with subungual debris  from hallux to fifth toes bilaterally. No evidence of bacterial infection or drainage bilaterally.     Orthopedic  No limitations of motion  feet .  No crepitus or effusions noted.  No bony pathology or digital deformities noted.  Skin  normotropic skin with no porokeratosis noted bilaterally.  No signs of infections .  Onychomycosis  Pain in right toes  Pain in left toes    Consent was obtained for treatment procedures.   Mechanical debridement of nails 1-5  bilaterally performed with a nail nipper.  Filed with dremel without incident.    Return office visit    3 months  for nail care.                Told patient to return for periodic foot care and evaluation due to potential at risk complications.   Cordella Bold DPM

## 2024-04-05 ENCOUNTER — Ambulatory Visit: Admitting: Podiatry
